# Patient Record
Sex: Female | Born: 1950 | Race: White | Hispanic: No | Marital: Married | Smoking: Never smoker
Health system: Southern US, Community
[De-identification: ages and names within clinical notes are randomized; demographics above are authoritative.]

## PROBLEM LIST (undated history)

## (undated) DIAGNOSIS — N6452 Nipple discharge: Secondary | ICD-10-CM

## (undated) DIAGNOSIS — E785 Hyperlipidemia, unspecified: Secondary | ICD-10-CM

## (undated) DIAGNOSIS — I10 Essential (primary) hypertension: Secondary | ICD-10-CM

## (undated) DIAGNOSIS — Z87442 Personal history of urinary calculi: Secondary | ICD-10-CM

## (undated) DIAGNOSIS — L409 Psoriasis, unspecified: Secondary | ICD-10-CM

## (undated) HISTORY — DX: Nipple discharge: N64.52

## (undated) HISTORY — DX: Hyperlipidemia, unspecified: E78.5

## (undated) HISTORY — DX: Essential (primary) hypertension: I10

## (undated) HISTORY — PX: BREAST EXCISIONAL BIOPSY: SUR124

## (undated) HISTORY — PX: OTHER SURGICAL HISTORY: SHX169

## (undated) HISTORY — PX: TUBAL LIGATION: SHX77

---
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
## 1996-03-16 HISTORY — PX: ABDOMINAL HYSTERECTOMY: SHX81

## 1999-12-05 ENCOUNTER — Encounter: Payer: Self-pay | Admitting: Family Medicine

## 1999-12-05 ENCOUNTER — Encounter: Admission: RE | Admit: 1999-12-05 | Discharge: 1999-12-05 | Payer: Self-pay | Admitting: Family Medicine

## 2000-02-16 ENCOUNTER — Other Ambulatory Visit: Admission: RE | Admit: 2000-02-16 | Discharge: 2000-02-16 | Payer: Self-pay | Admitting: Obstetrics and Gynecology

## 2000-12-06 ENCOUNTER — Encounter: Admission: RE | Admit: 2000-12-06 | Discharge: 2000-12-06 | Payer: Self-pay | Admitting: Family Medicine

## 2000-12-06 ENCOUNTER — Encounter: Payer: Self-pay | Admitting: Family Medicine

## 2001-02-21 ENCOUNTER — Other Ambulatory Visit: Admission: RE | Admit: 2001-02-21 | Discharge: 2001-02-21 | Payer: Self-pay | Admitting: Obstetrics and Gynecology

## 2001-07-07 ENCOUNTER — Emergency Department (HOSPITAL_COMMUNITY): Admission: EM | Admit: 2001-07-07 | Discharge: 2001-07-07 | Payer: Self-pay | Admitting: Emergency Medicine

## 2001-07-07 ENCOUNTER — Encounter: Payer: Self-pay | Admitting: Emergency Medicine

## 2001-12-07 ENCOUNTER — Encounter: Payer: Self-pay | Admitting: Family Medicine

## 2001-12-07 ENCOUNTER — Encounter: Admission: RE | Admit: 2001-12-07 | Discharge: 2001-12-07 | Payer: Self-pay | Admitting: Family Medicine

## 2002-08-11 ENCOUNTER — Encounter: Payer: Self-pay | Admitting: Family Medicine

## 2002-08-11 ENCOUNTER — Ambulatory Visit (HOSPITAL_COMMUNITY): Admission: RE | Admit: 2002-08-11 | Discharge: 2002-08-11 | Payer: Self-pay | Admitting: Family Medicine

## 2002-08-11 IMAGING — CT CT PELVIS W/O CM
1 of 2 series · 15 of 32 positions shown, 19 images · non-contrast
Comparison: [DATE].

FINDINGS
CLINICAL DATA: SPLENIC ARTERY ANEURYSM.
CT OF THE ABDOMEN AND PELVIS WITHOUT CONTRAST - [DATE]
TECHNIQUE: CONTIGUOUS 5 MM AXIAL IMAGES WERE OBTAINED FROM THE UPPER ABDOMEN TO THE PUBIC SYMPHYSIS AFTER
UNINFUSED SPIRAL CT SCANNING OF THE ABDOMEN AND PELVIS.

[Series 2: abd/pelvis 5.0 b30f · axial · 0.74mm/px · z∈[-733,-273]mm · 15 of 100 slices shown, 19 images]
[im 4/100  soft-tissue]
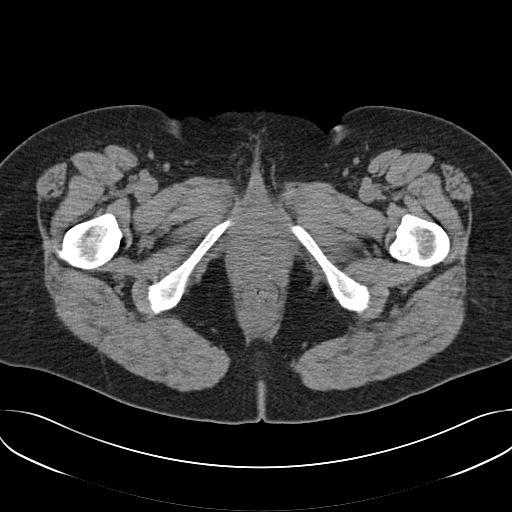
[im 4/100  bone]
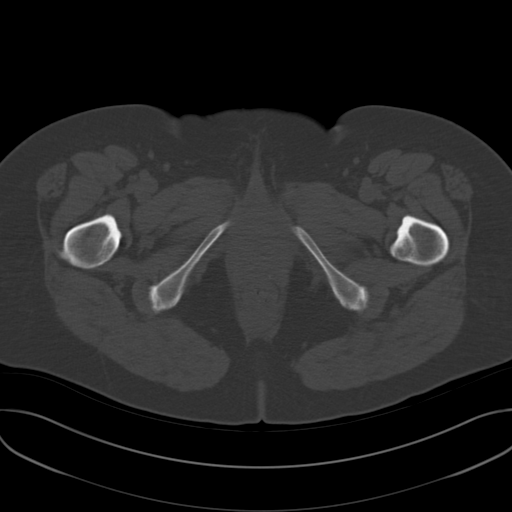
[im 12/100  soft-tissue]
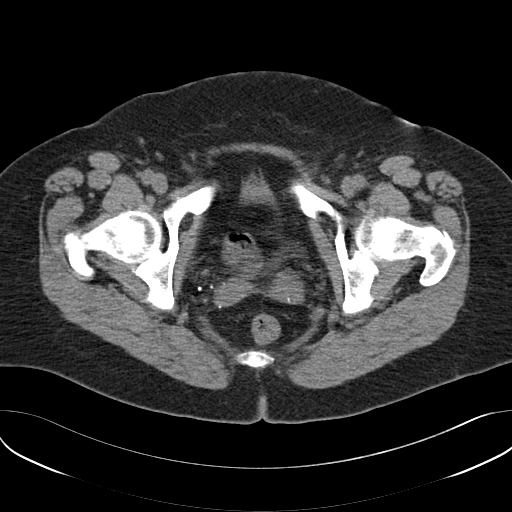
[im 20/100  soft-tissue]
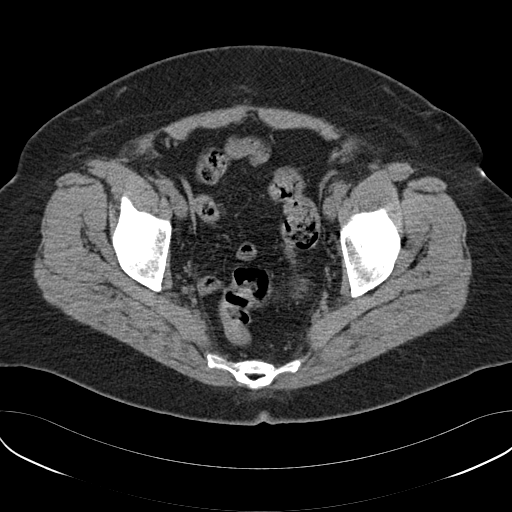
[im 28/100  soft-tissue]
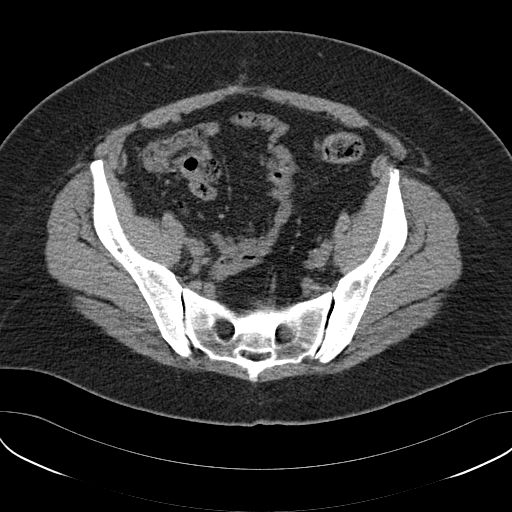
[im 36/100  soft-tissue]
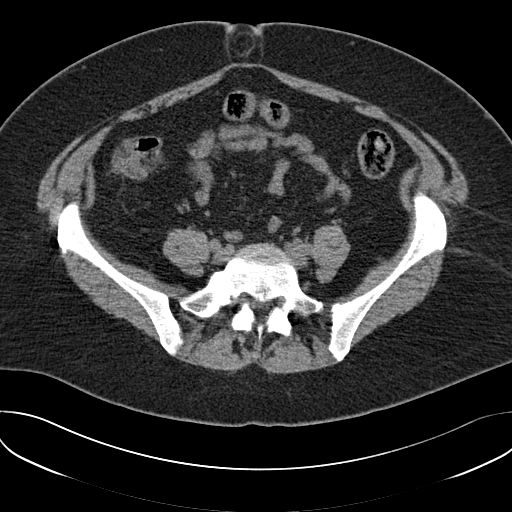
[im 44/100  soft-tissue]
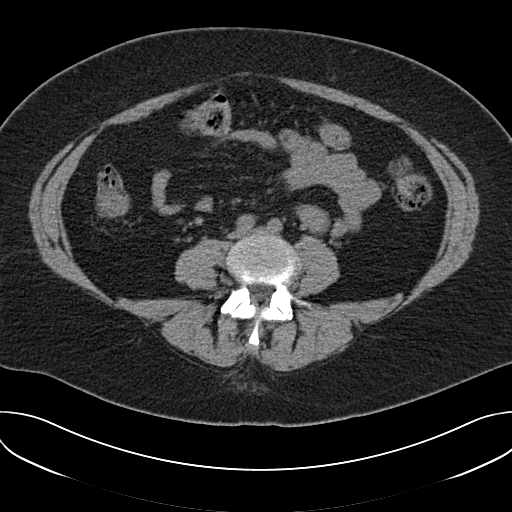
[im 52/100  soft-tissue]
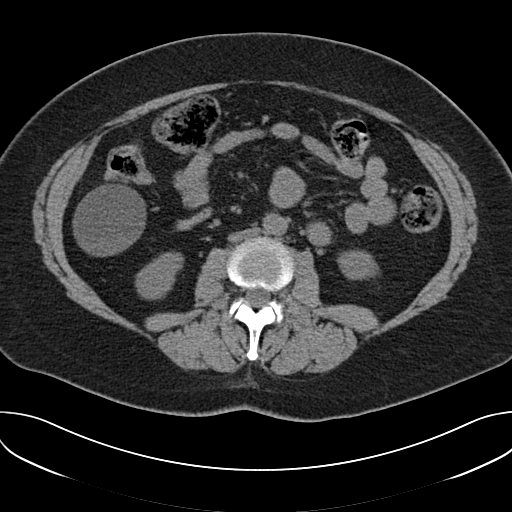
[im 56/100  soft-tissue]
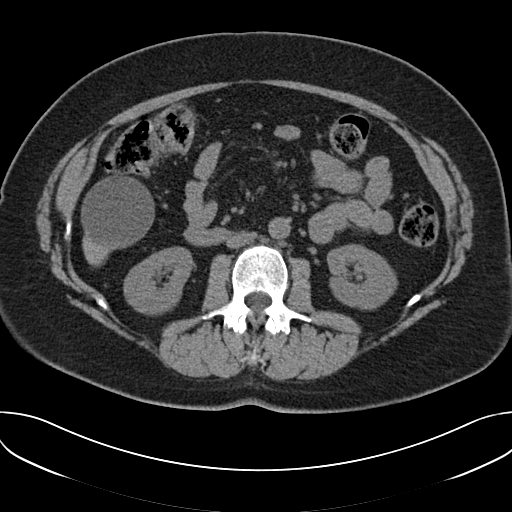
[im 64/100  soft-tissue]
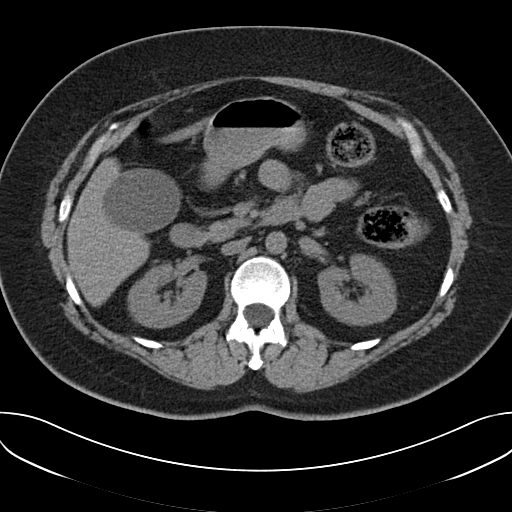
[im 64/100  bone]
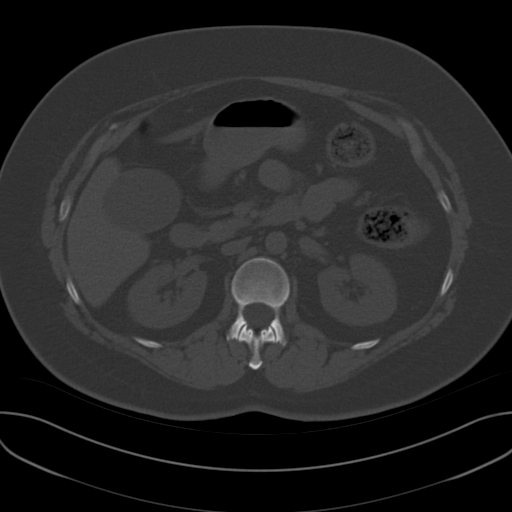
[im 72/100  soft-tissue]
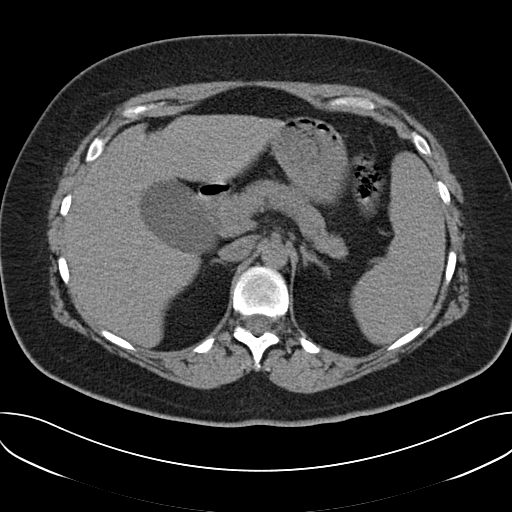
[im 80/100  soft-tissue]
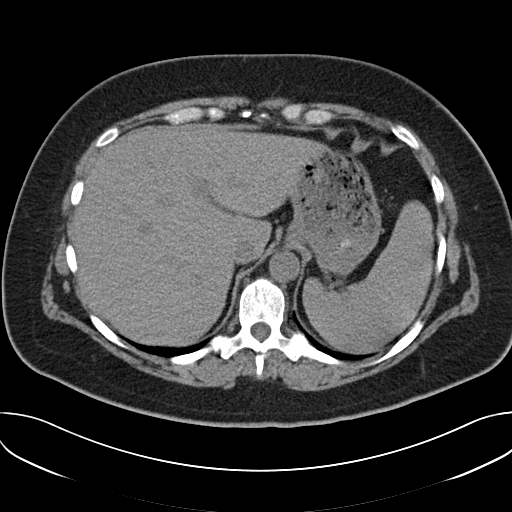
[im 84/100  lung]
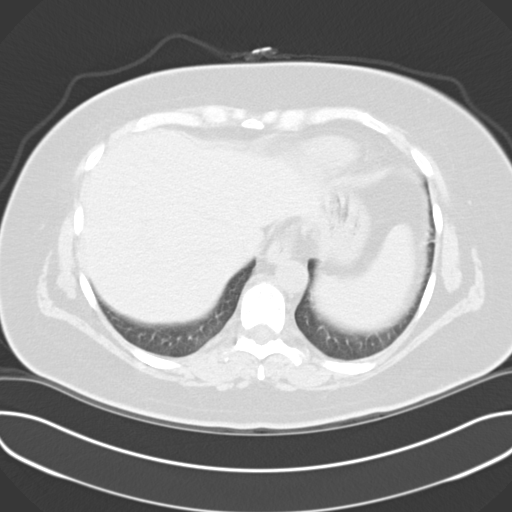
[im 88/100  soft-tissue]
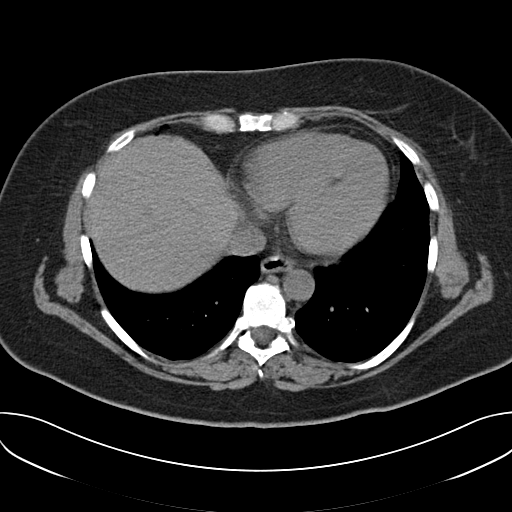
[im 88/100  lung]
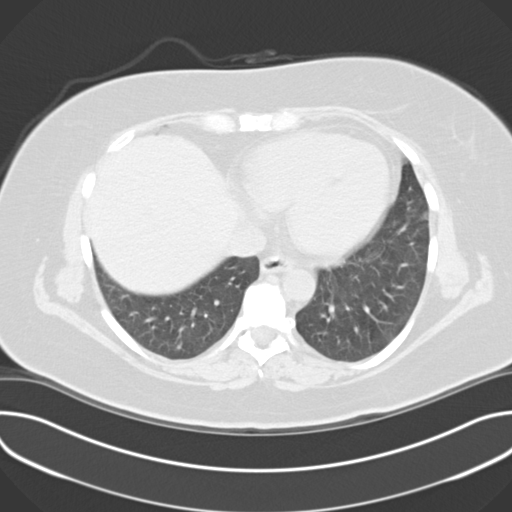
[im 92/100  lung]
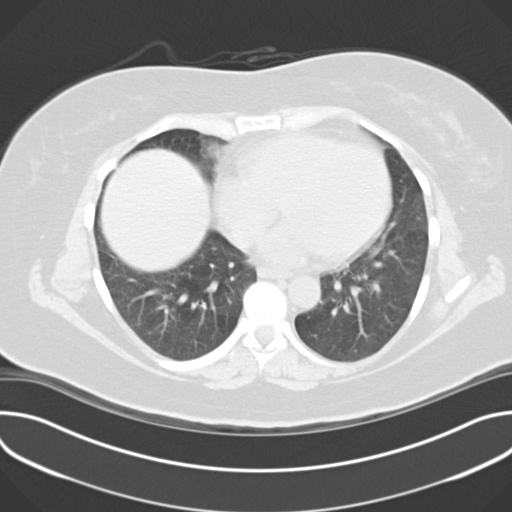
[im 96/100  soft-tissue]
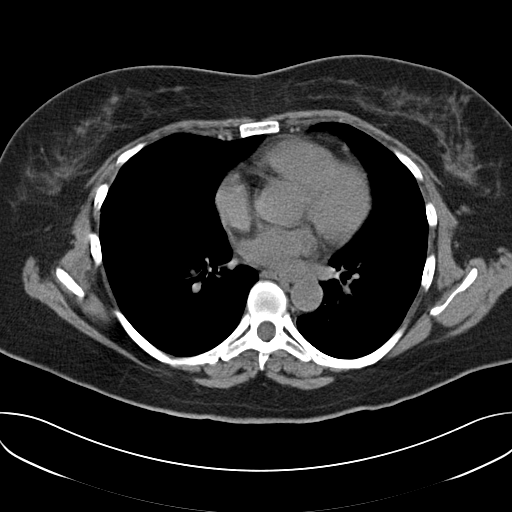
[im 96/100  lung]
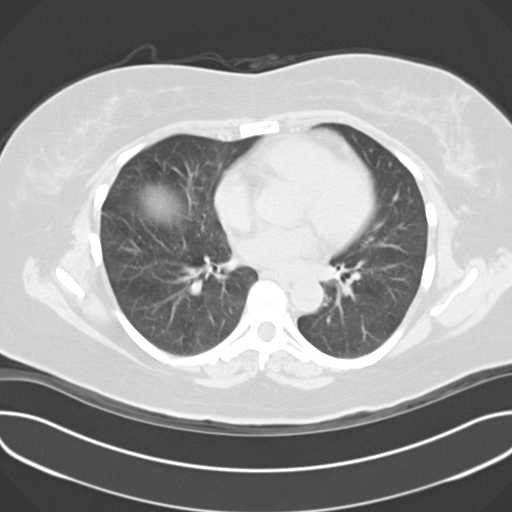

[15 of 32 positions shown; findings below may reference images not displayed]

FINDINGS: ABDOMEN:
NO FOCAL ABNORMALITY IS SEEN IN THE LIVER OR SPLEEN ALTHOUGH THE LACK OF INTRAVENOUS CONTRAST
LESSENS SENSITIVITY FOR EVALUATION.  THE STOMACH, DUODENUM, PANCREAS, AND ADRENAL GLANDS ARE
UNREMARKABLE.  THE GALLBLADDER IS DISTENDED.  THERE IS NO EVIDENCE FOR RENAL CALCULI.
THE CALCIFIED SPLENIC ARTERY ANEURYSM IS AGAIN IDENTIFIED AND MEASURES 1.0 CM IN MAXIMUM OBTAINABLE
DIAMETER.
NO FREE FLUID OR LYMPHADENOPATHY IS APPARENT.
A SMALL UMBILICAL HERNIA CONTAINS ONLY FAT.
IMPRESSION
1.   NO INTERVAL CHANGE IN THE CALCIFIED SPLENIC ARTERY ANEURYSM.
2.   SMALL UMBILICAL HERNIA.
PELVIS:
IMAGING THROUGH THE ANATOMIC PELVIS SHOWS THE PATIENT TO BE STATUS POST HYSTERECTOMY.  NO EVIDENCE
FOR ADNEXAL MASSES.
IMPRESSION
UNREMARKABLE STATUS POST HYSTERECTOMY.

## 2003-03-17 IMAGING — CR DG CHEST 2V
2 series · 2 of 2 positions shown · non-contrast
Comparison: None.

CLINICAL DATA: Cough.
 CHEST, TWO VIEWS:

[view not recorded (1 of 2)]
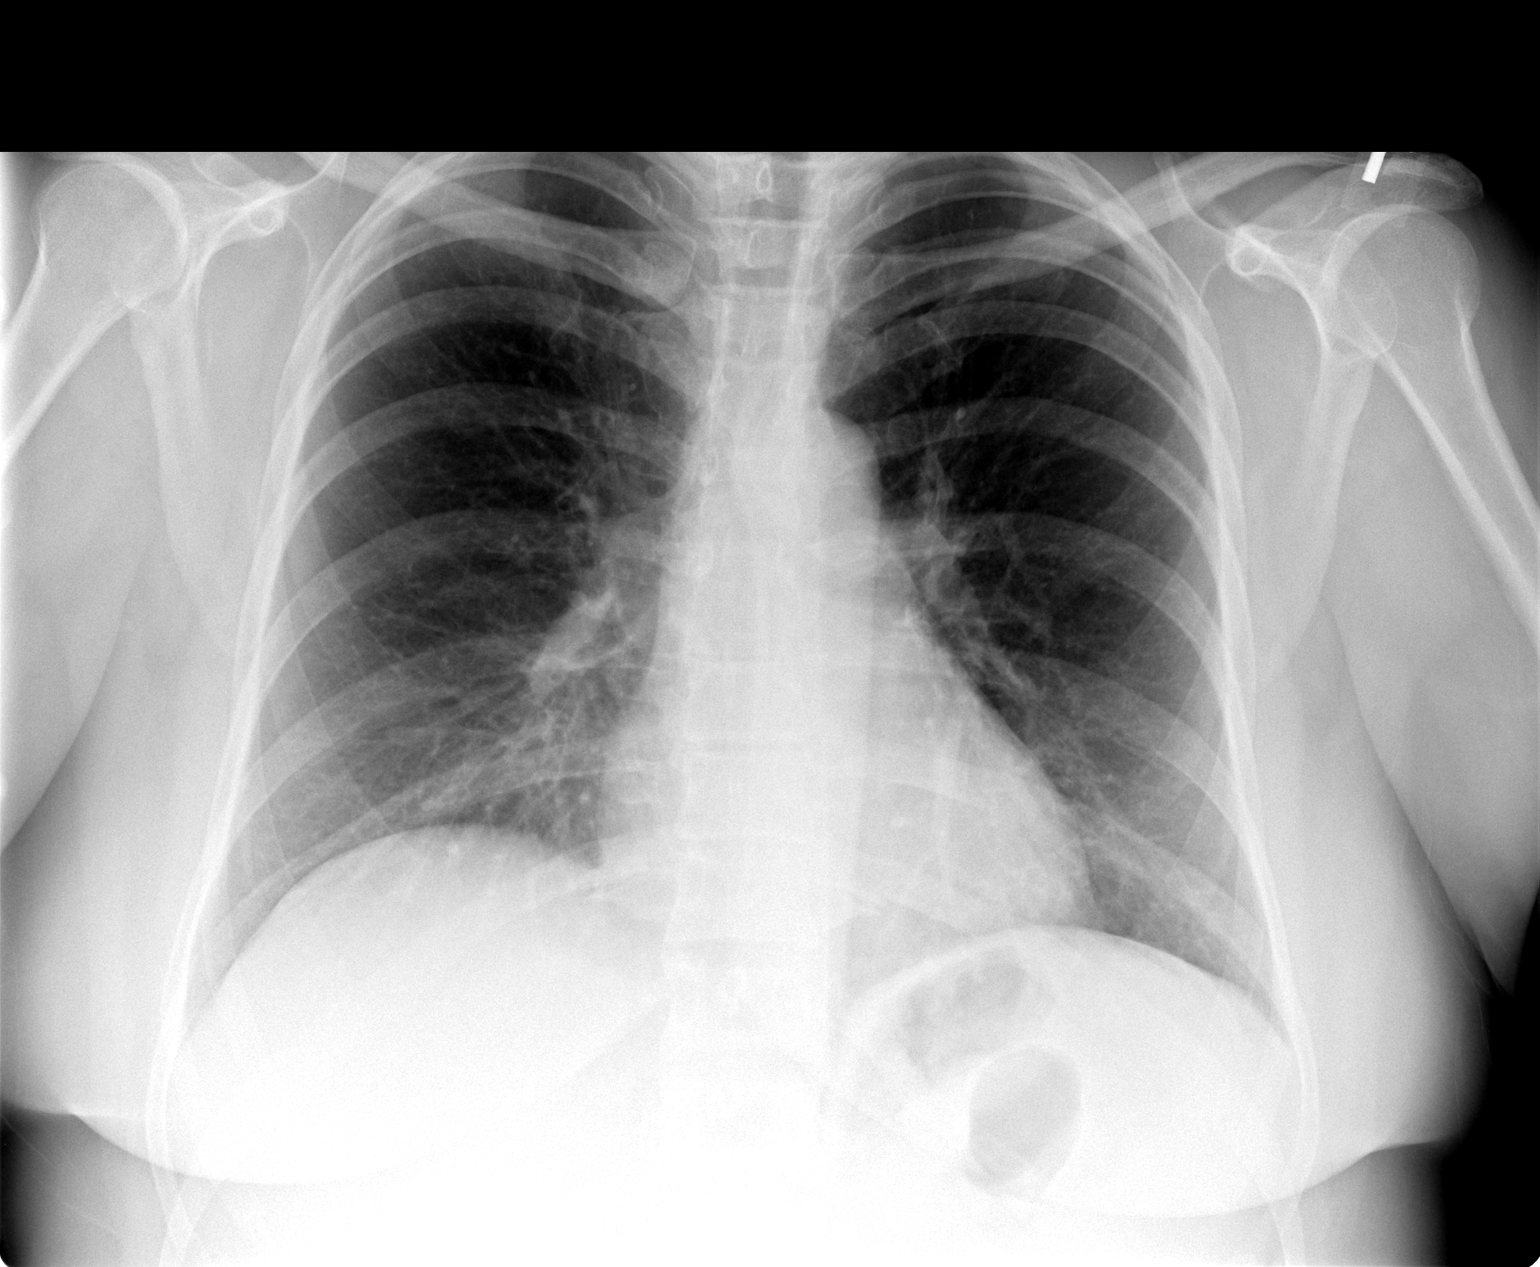

[view not recorded (2 of 2)]
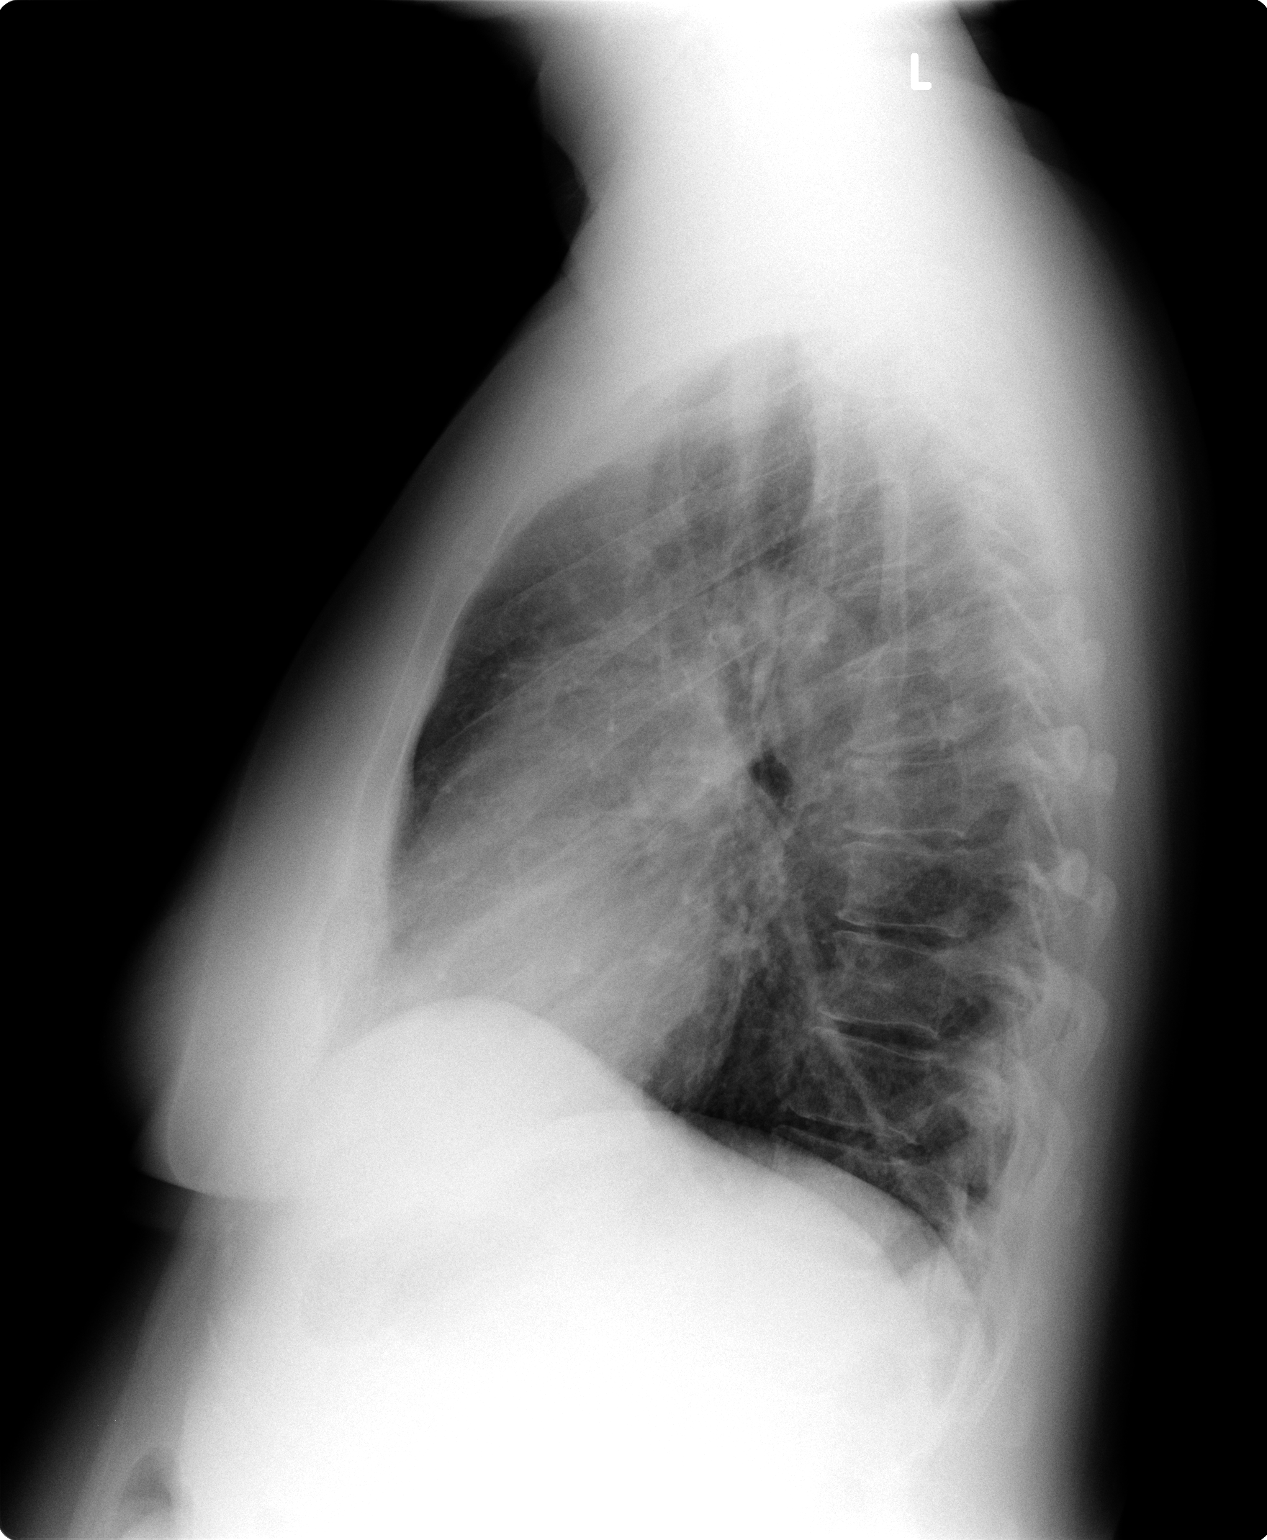

[2 of 2 positions shown; findings below may reference images not displayed]

FINDINGS: Heart size within normal limits.  Mild peribronchial thickening.  No acute air space disease or pleural fluid.
IMPRESSION: Slight peribronchial thickening ? no active disease.

## 2003-08-10 ENCOUNTER — Encounter: Admission: RE | Admit: 2003-08-10 | Discharge: 2003-08-10 | Payer: Self-pay | Admitting: Obstetrics and Gynecology

## 2004-02-14 ENCOUNTER — Ambulatory Visit (HOSPITAL_COMMUNITY): Admission: RE | Admit: 2004-02-14 | Discharge: 2004-02-14 | Payer: Self-pay | Admitting: Family Medicine

## 2004-02-14 IMAGING — CT CT ABDOMEN W/O CM
1 of 2 series · 15 of 32 positions shown, 19 images · IV contrast (agent unspecified)
Comparison: [DATE].
 ABDOMEN:

CLINICAL DATA: Splenic artery aneurysm ? follow-up.
 CT ABDOMEN AND PELVIS WITHOUT CONTRAST:
TECHNIQUE: Multidetector helical imaging was carried out through the abdomen and pelvis without oral or IV contrast, as requested.

[Series 3: — · axial · 0.71mm/px · z∈[-465,-20]mm · 15 of 99 slices shown, 19 images]
[im 5/99  soft-tissue]
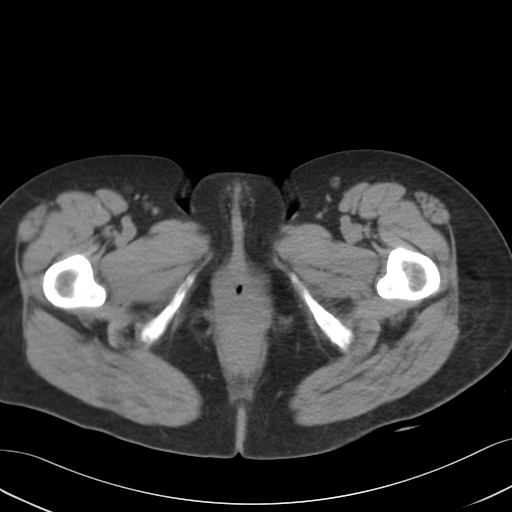
[im 5/99  bone]
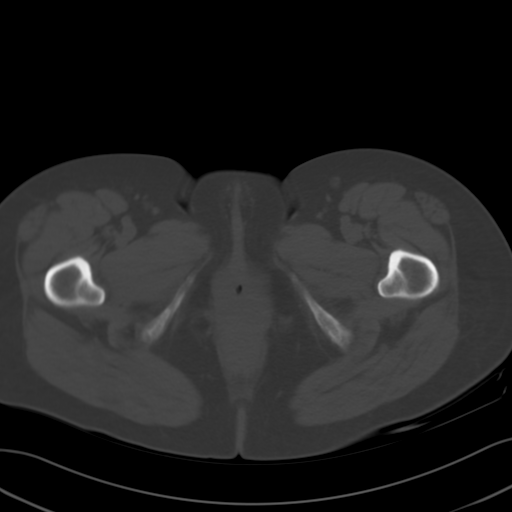
[im 13/99  soft-tissue]
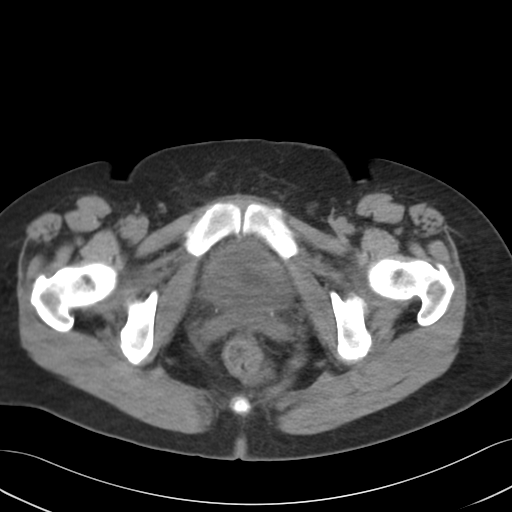
[im 21/99  soft-tissue]
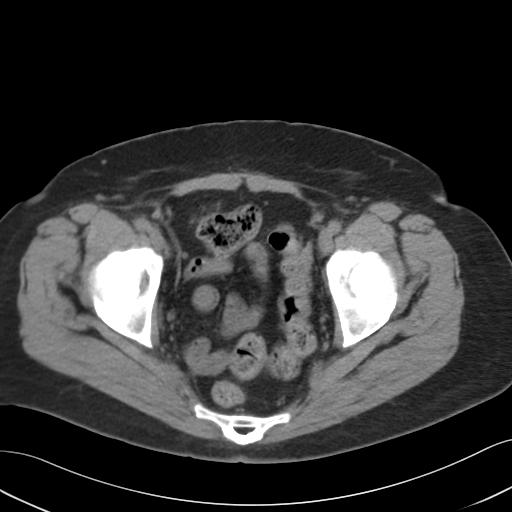
[im 29/99  soft-tissue]
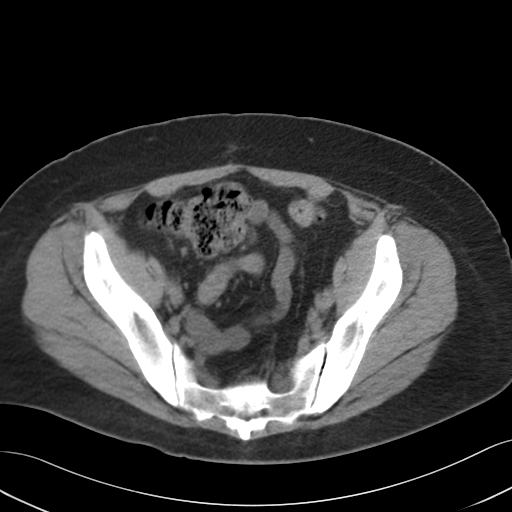
[im 33/99  soft-tissue]
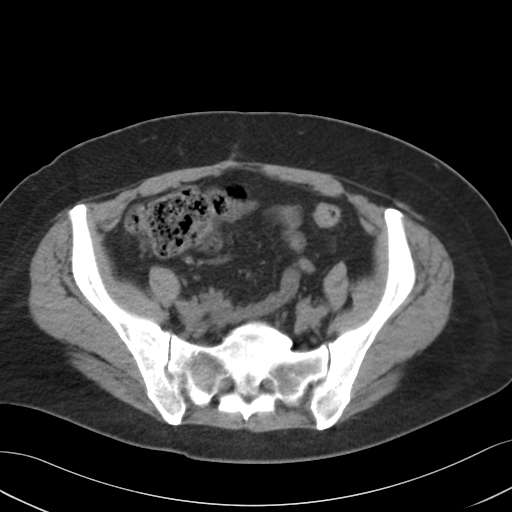
[im 41/99  soft-tissue]
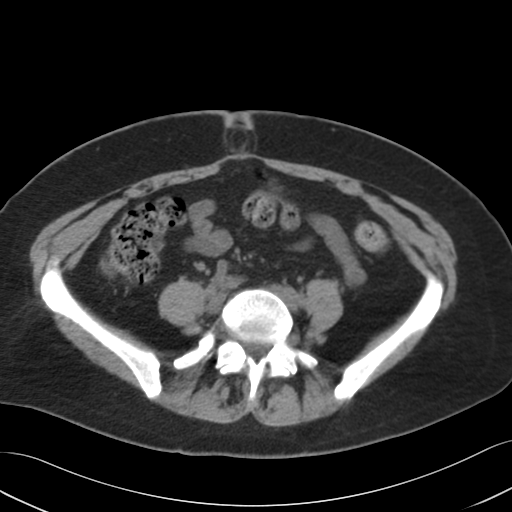
[im 50/99  soft-tissue]
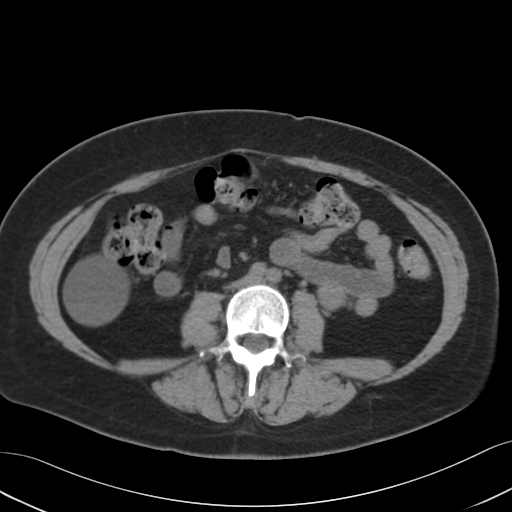
[im 58/99  soft-tissue]
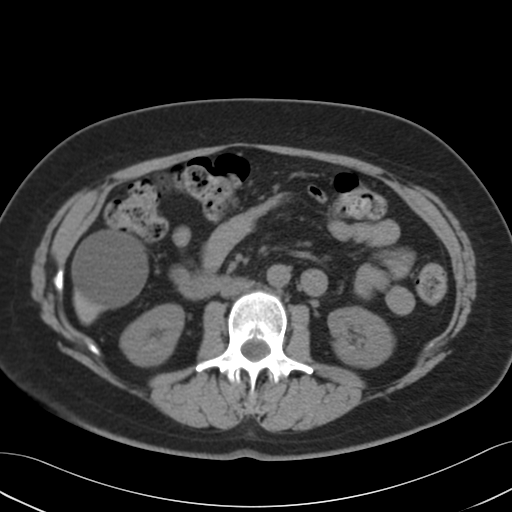
[im 66/99  soft-tissue]
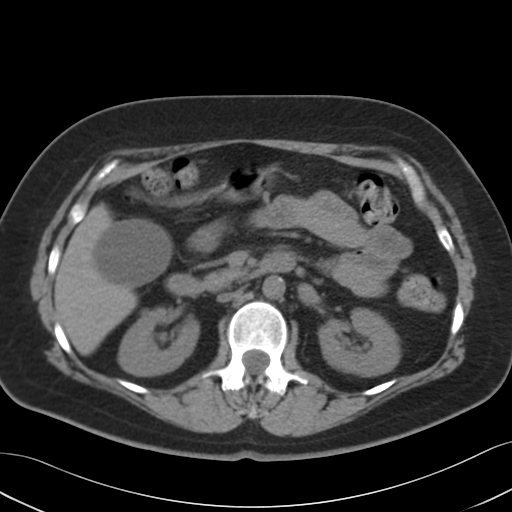
[im 66/99  bone]
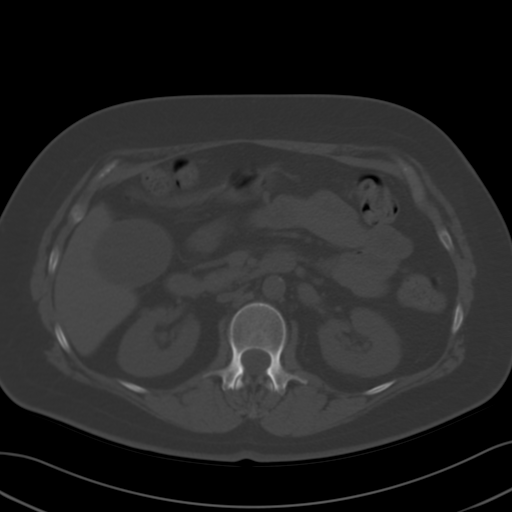
[im 70/99  soft-tissue]
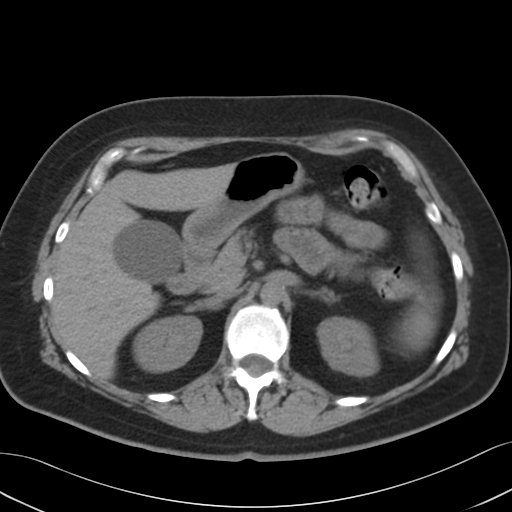
[im 78/99  soft-tissue]
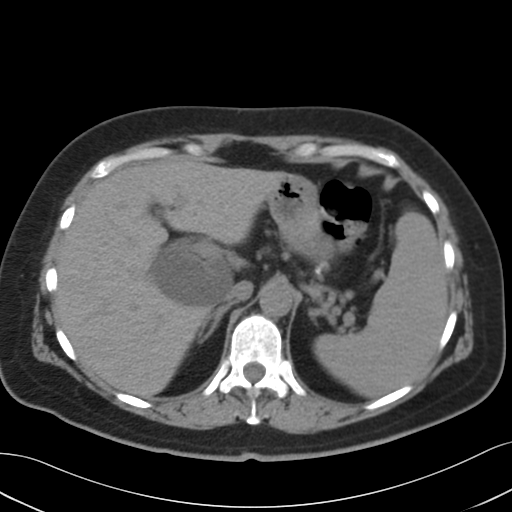
[im 82/99  lung]
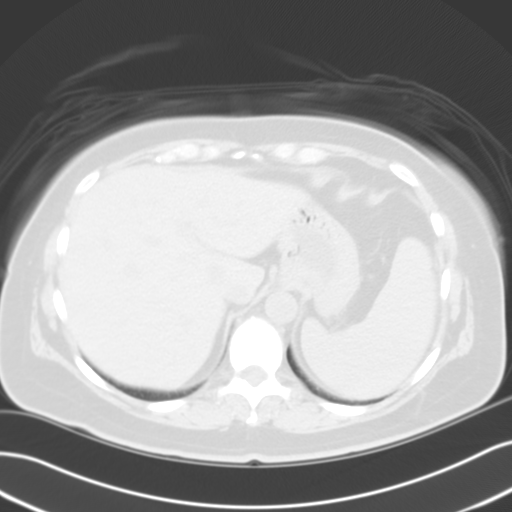
[im 86/99  soft-tissue]
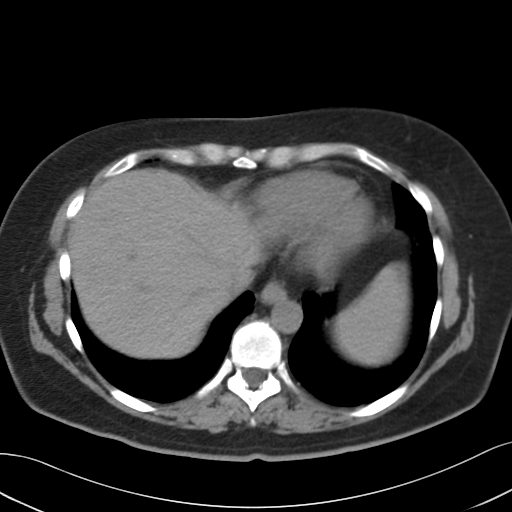
[im 86/99  lung]
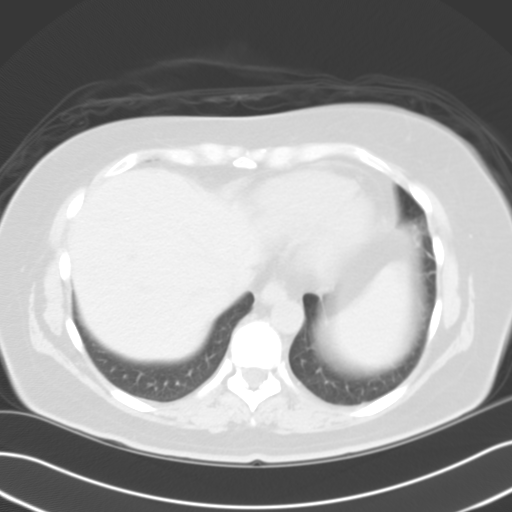
[im 90/99  lung]
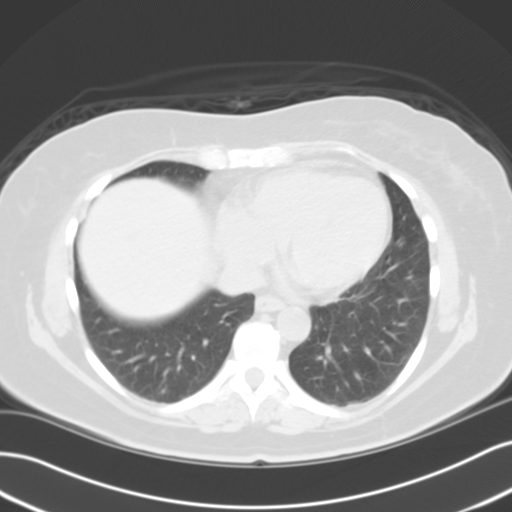
[im 94/99  soft-tissue]
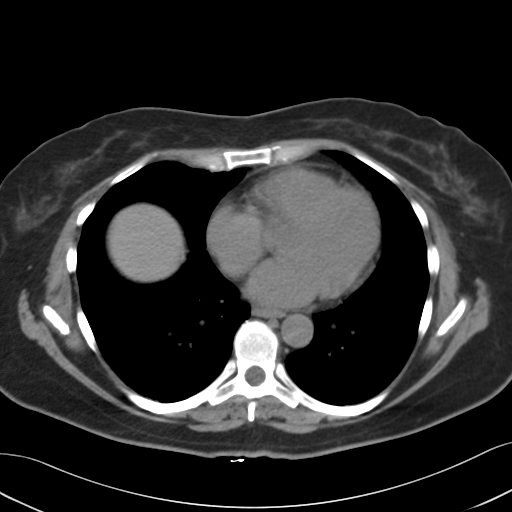
[im 94/99  lung]
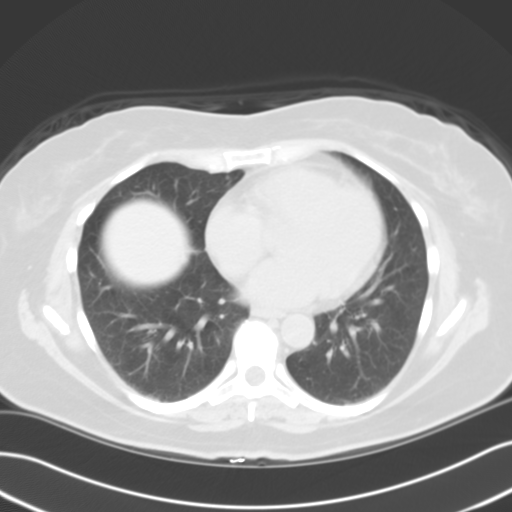

[15 of 32 positions shown; findings below may reference images not displayed]

FINDINGS: The calcified splenic artery aneurysm has not significantly changed in size or contour.  It is a bit more heavily calcified perhaps, but it remains about 10 mm in maximum size.  
 No other findings of significance.  The gallbladder is moderately distended but this was noted previously.
 Other viscera are unremarkable in the unenhanced state.
IMPRESSION: 1.  Stable splenic artery aneurysm ? about 10 mm in maximum diameter.
 2.  Moderately distended gallbladder as before.
 3.  No other acute or significant findings.
 PELVIS:
FINDINGS: No focal masses, adenopathy, or fluid collections.  Umbilical hernia is again noted.
IMPRESSION: 1.  No acute or significant findings.  
 2.  There is an umbilical hernia as before.

## 2004-05-20 ENCOUNTER — Encounter: Admission: RE | Admit: 2004-05-20 | Discharge: 2004-05-20 | Payer: Self-pay | Admitting: Family Medicine

## 2004-12-04 ENCOUNTER — Encounter: Admission: RE | Admit: 2004-12-04 | Discharge: 2004-12-04 | Payer: Self-pay | Admitting: Obstetrics and Gynecology

## 2005-12-29 ENCOUNTER — Encounter: Admission: RE | Admit: 2005-12-29 | Discharge: 2005-12-29 | Payer: Self-pay | Admitting: Obstetrics and Gynecology

## 2005-12-29 IMAGING — MG MM SCREEN MAMMOGRAM BILATERAL
5 series · 5 of 5 positions shown · non-contrast
Comparison: none

DG SCREEN MAMMOGRAM BILATERAL
Bilateral CC and MLO view(s) were taken.

DIGITAL SCREENING MAMMOGRAM WITH CAD:
There is a fibroglandular pattern.  No masses or malignant type calcifications are identified.  
Compared with prior studies.

[R CC]
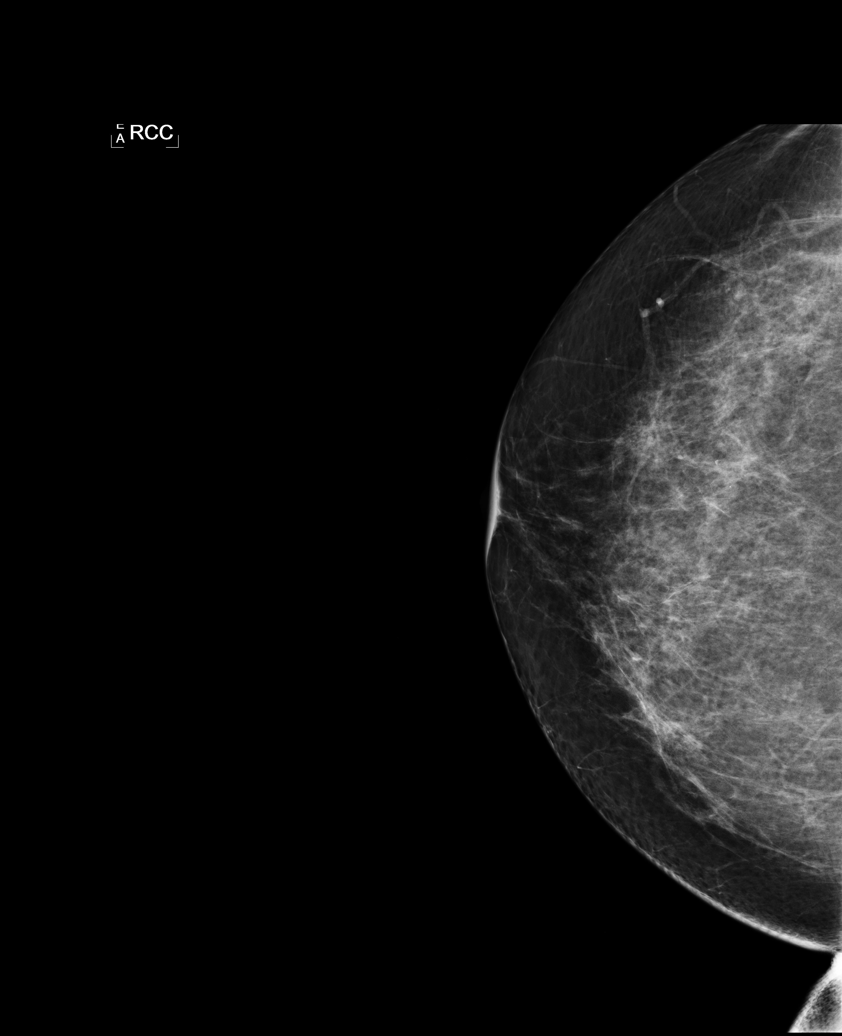

[L CC]
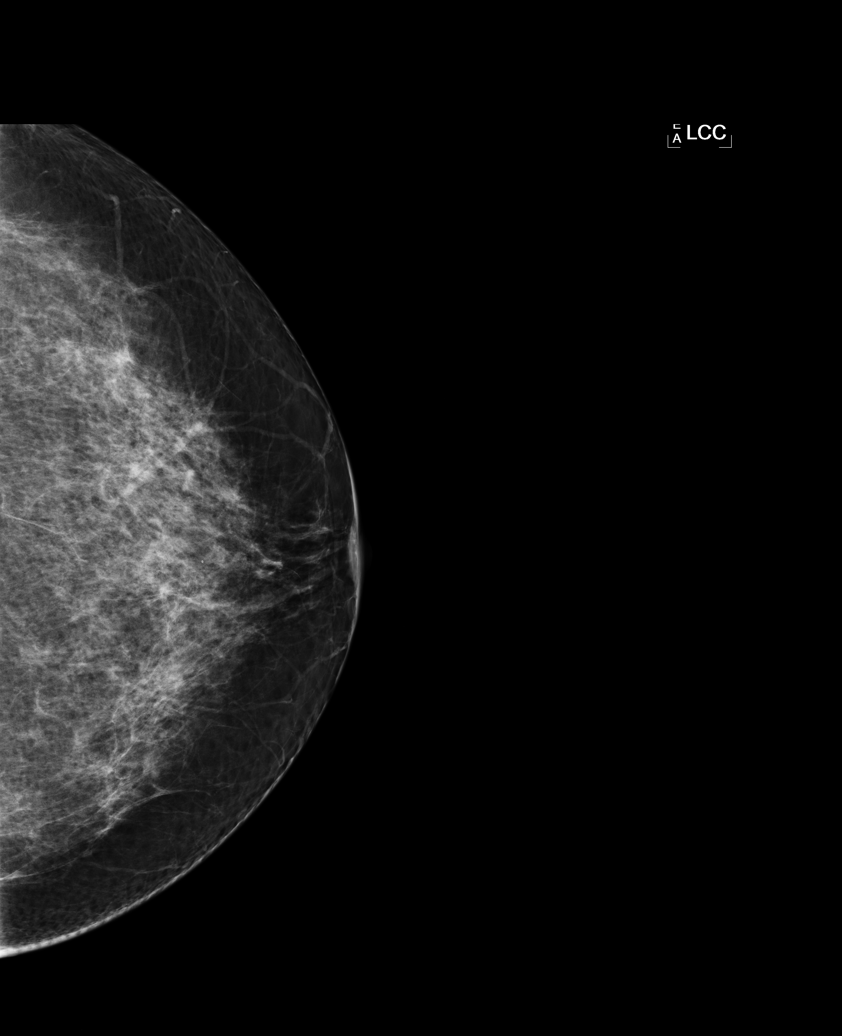

[L MLO]
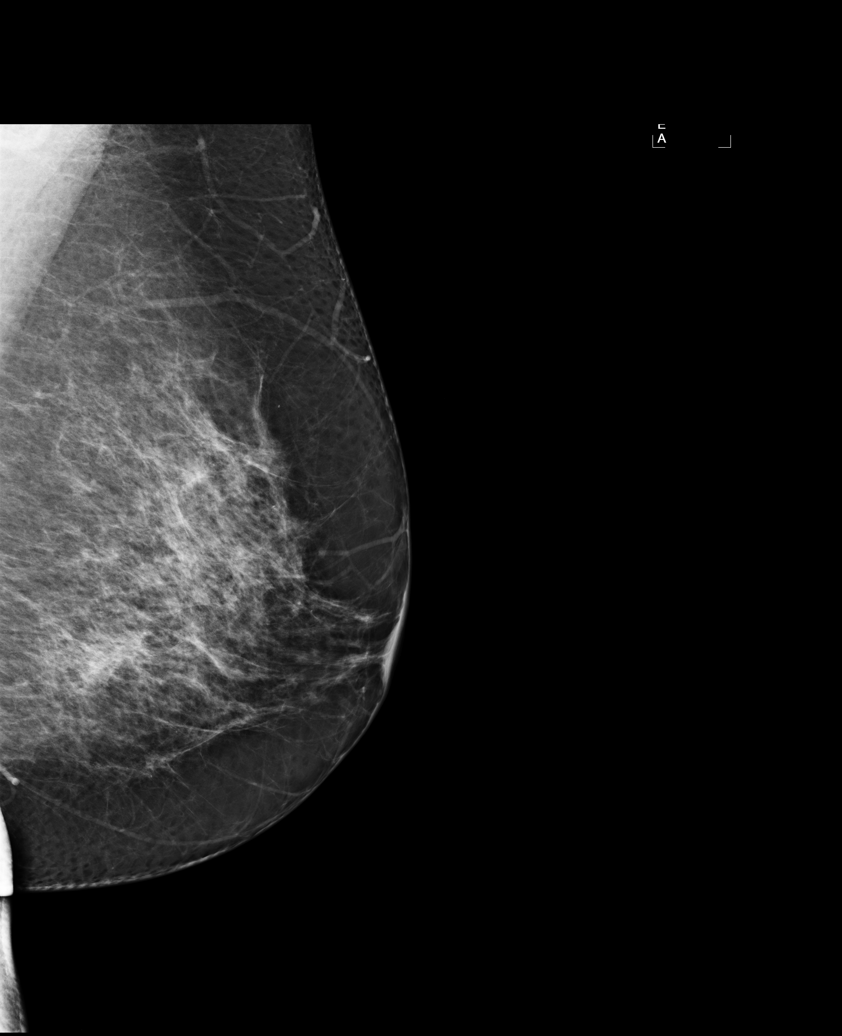

[R MLO (1 of 2)]
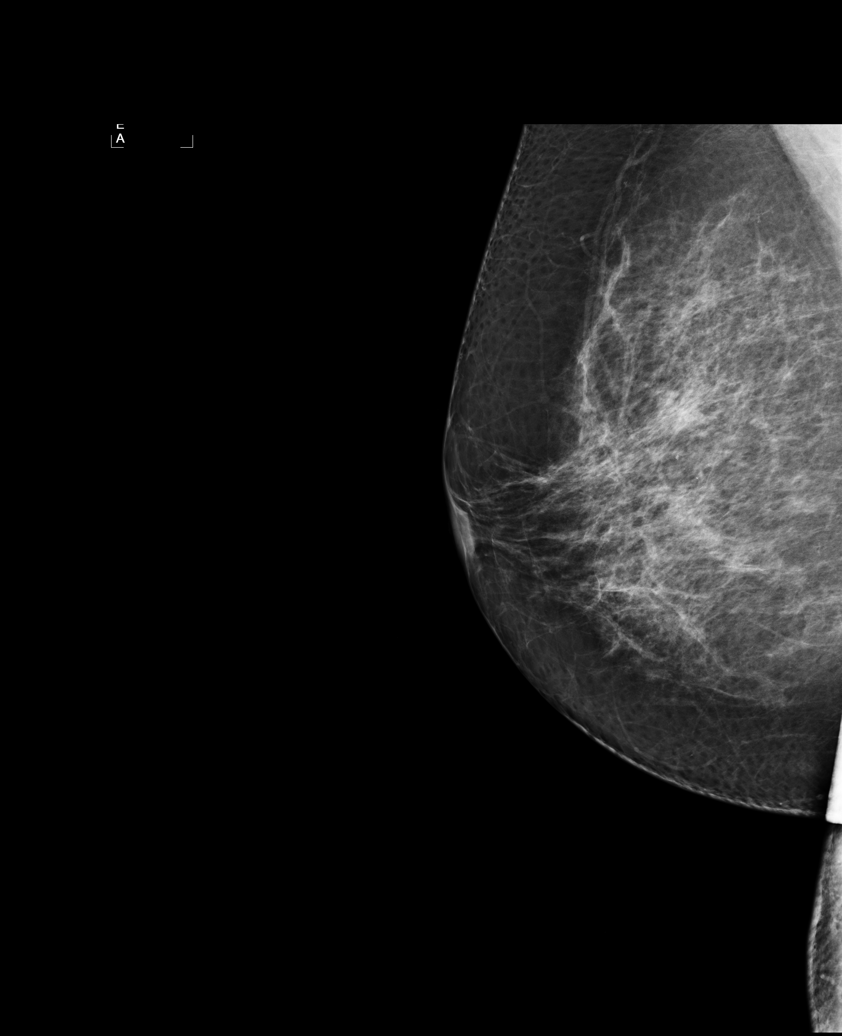

[R MLO (2 of 2)]
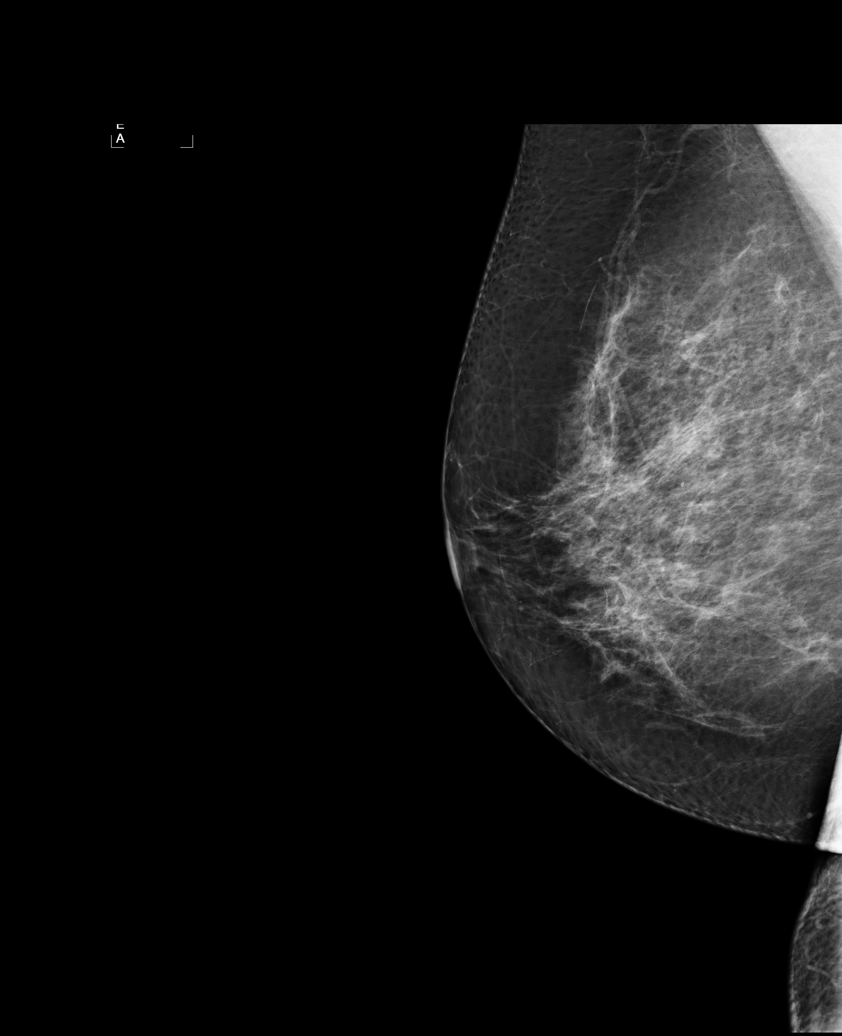

[5 of 5 positions shown; findings below may reference images not displayed]

IMPRESSION: No specific mammographic evidence of malignancy.  Next screening mammogram is recommended in one 
year.

ASSESSMENT: Negative - BI-RADS 1

Screening mammogram in 1 year.
ANALYZED BY COMPUTER AIDED DETECTION. , THIS PROCEDURE WAS A DIGITAL MAMMOGRAM.

## 2008-09-21 ENCOUNTER — Encounter: Admission: RE | Admit: 2008-09-21 | Discharge: 2008-09-21 | Payer: Self-pay | Admitting: Obstetrics and Gynecology

## 2008-09-21 IMAGING — MG MM SCREEN MAMMOGRAM BILATERAL
4 series · 4 of 4 positions shown · non-contrast
Comparison: Prior studies.

DG SCREEN MAMMOGRAM BILATERAL
Bilateral CC and MLO view(s) were taken.
Prior study comparison: [DATE], DG screen mammogram bilateral.  [DATE], 
bilateral screening mammogram.

DIGITAL SCREENING MAMMOGRAM WITH CAD:

[R CC]
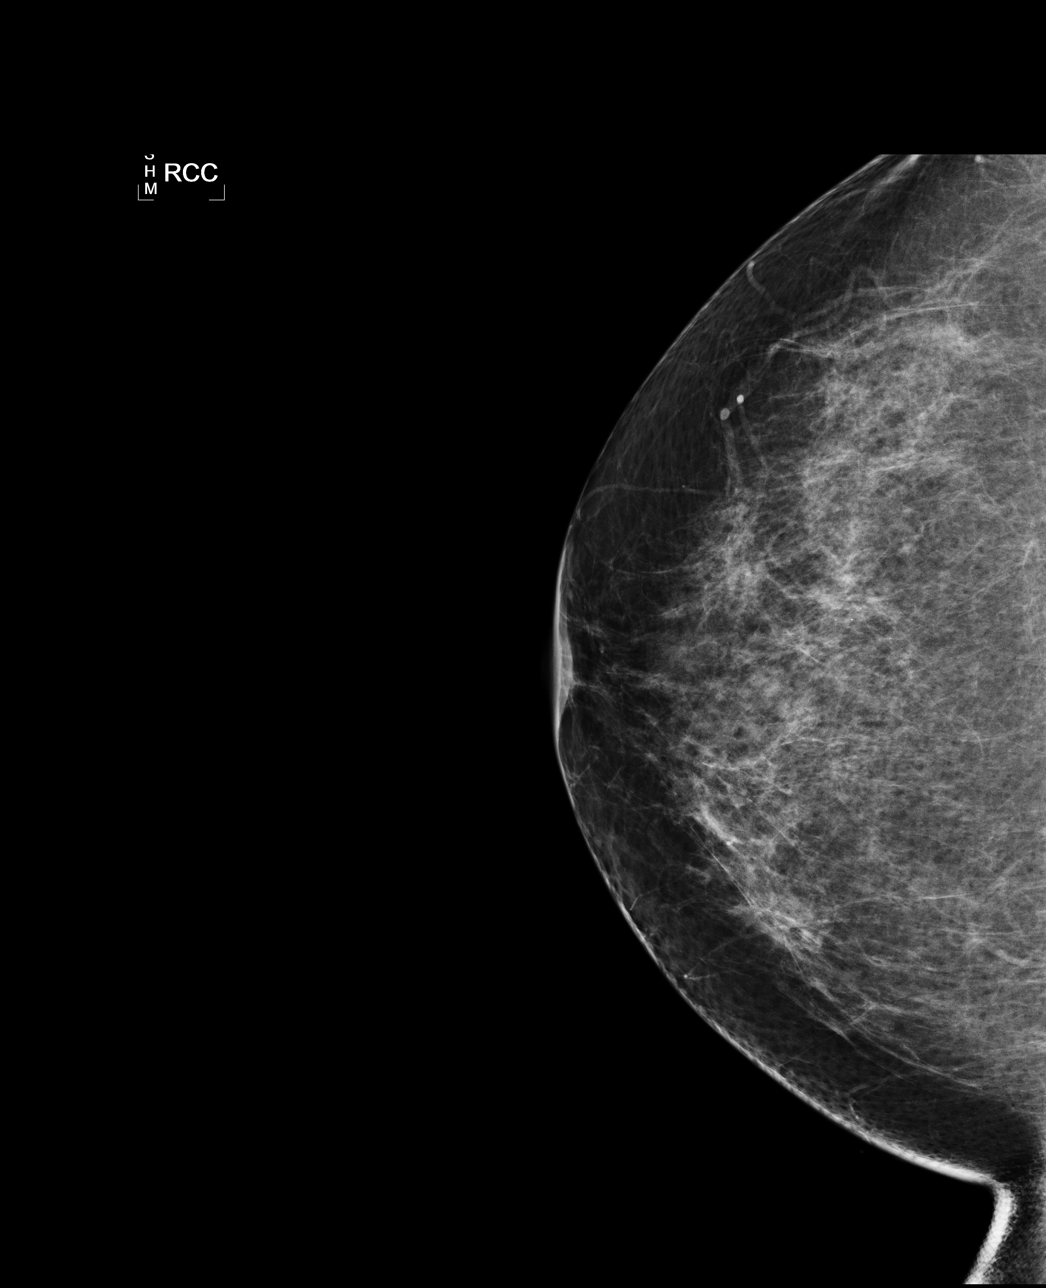

[L CC]
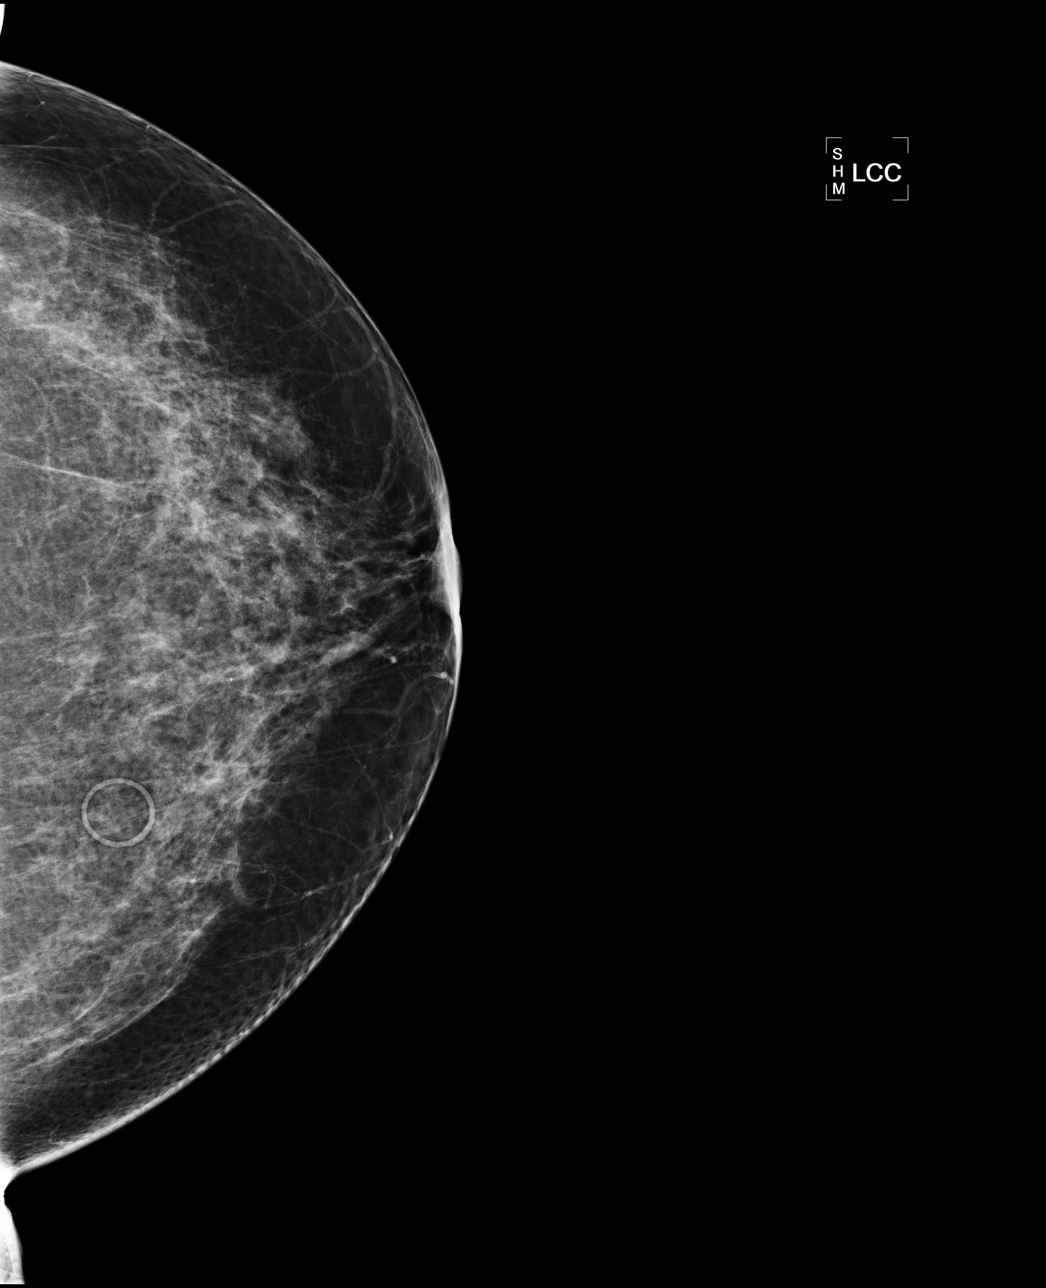

[L MLO]
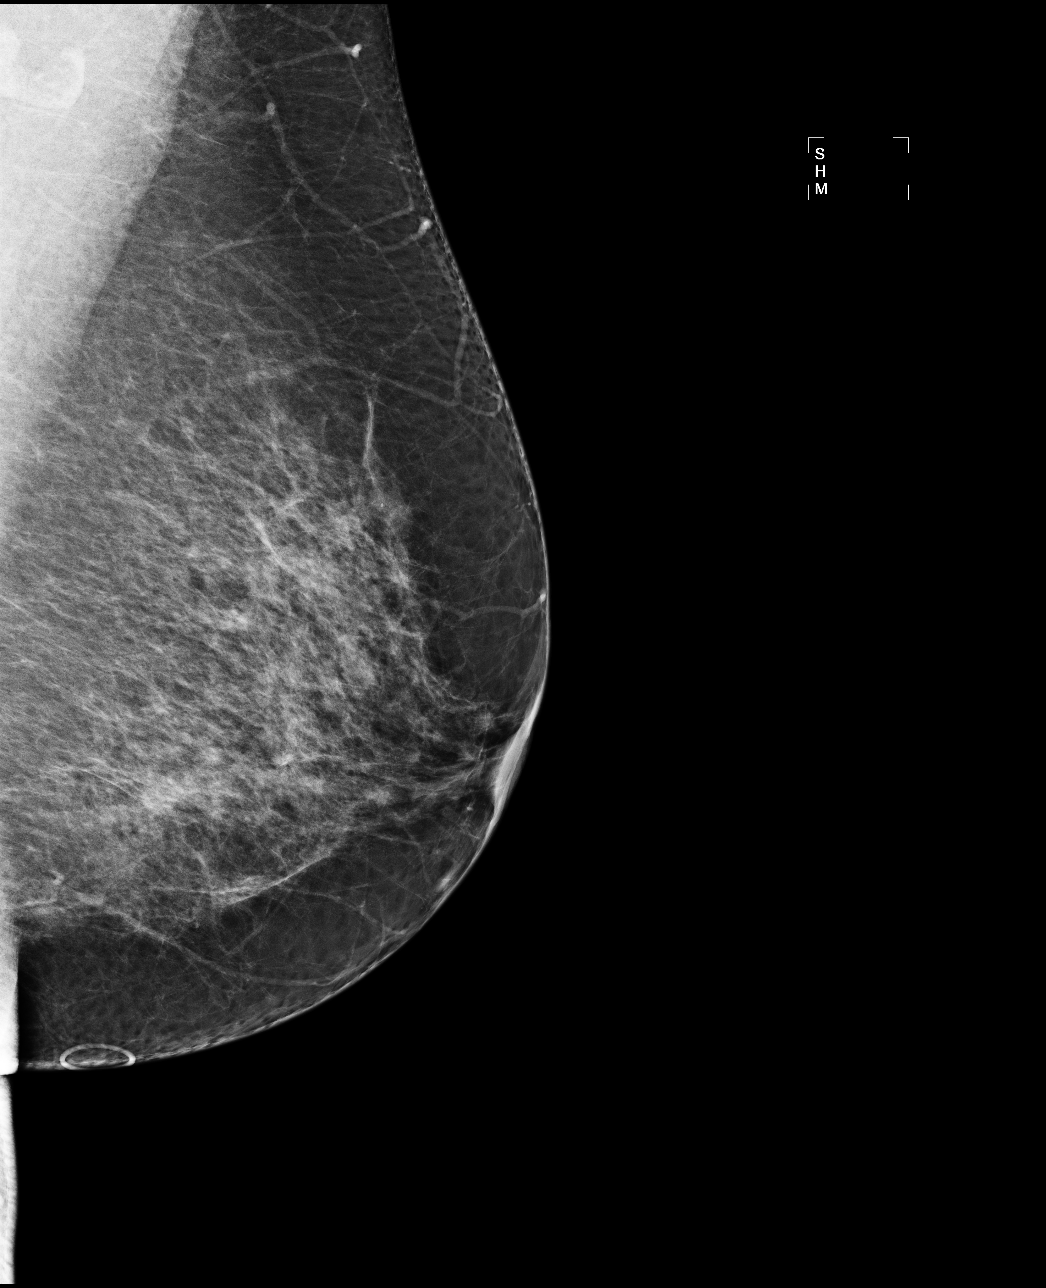

[R MLO]
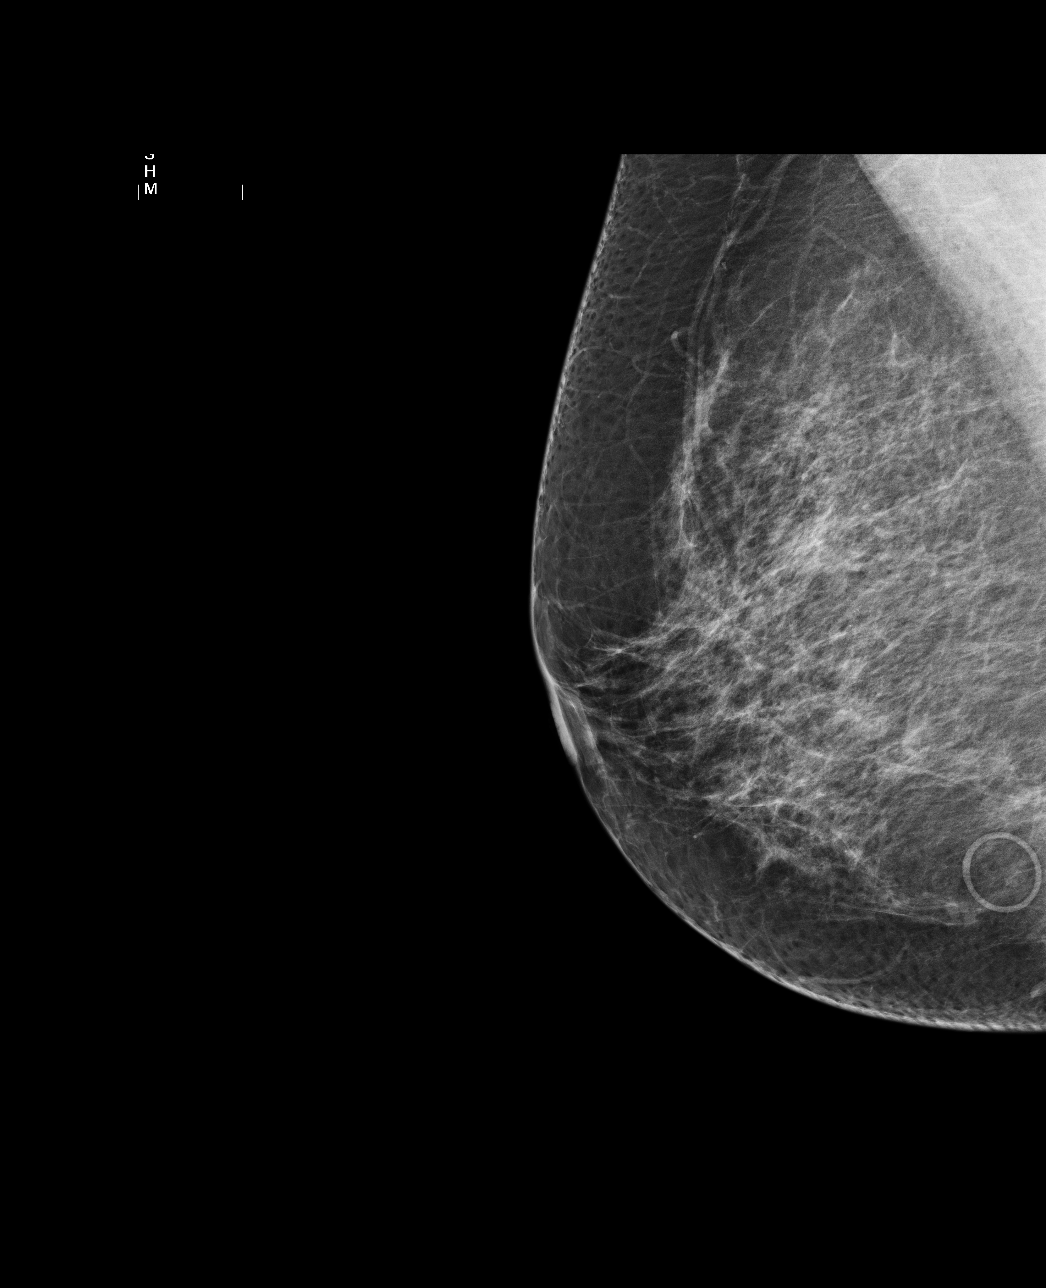

[4 of 4 positions shown; findings below may reference images not displayed]

There are scattered fibroglandular densities.  There is no dominant mass, architectural distortion 
or calcification to suggest malignancy.

Images were processed with CAD.
IMPRESSION: No mammographic evidence of malignancy.  Suggest yearly screening mammography.

A result letter of this screening mammogram will be mailed directly to the patient.

ASSESSMENT: Negative - BI-RADS 1

Screening mammogram in 1 year.
ANALYZED BY COMPUTER AIDED DETECTION. , THIS PROCEDURE WAS A DIGITAL MAMMOGRAM.

## 2009-09-23 ENCOUNTER — Encounter: Admission: RE | Admit: 2009-09-23 | Discharge: 2009-09-23 | Payer: Self-pay | Admitting: Obstetrics and Gynecology

## 2009-09-23 IMAGING — MG MM DIGITAL DIAGNOSTIC BILAT
4 series · 4 of 4 positions shown · non-contrast
Comparison: Multiple priors

CLINICAL DATA: 58-year-old female with history of intermittent,
spontaneous left nipple discharge.

DIGITAL DIAGNOSTIC BILATERAL MAMMOGRAM WITH CAD

[R CC]
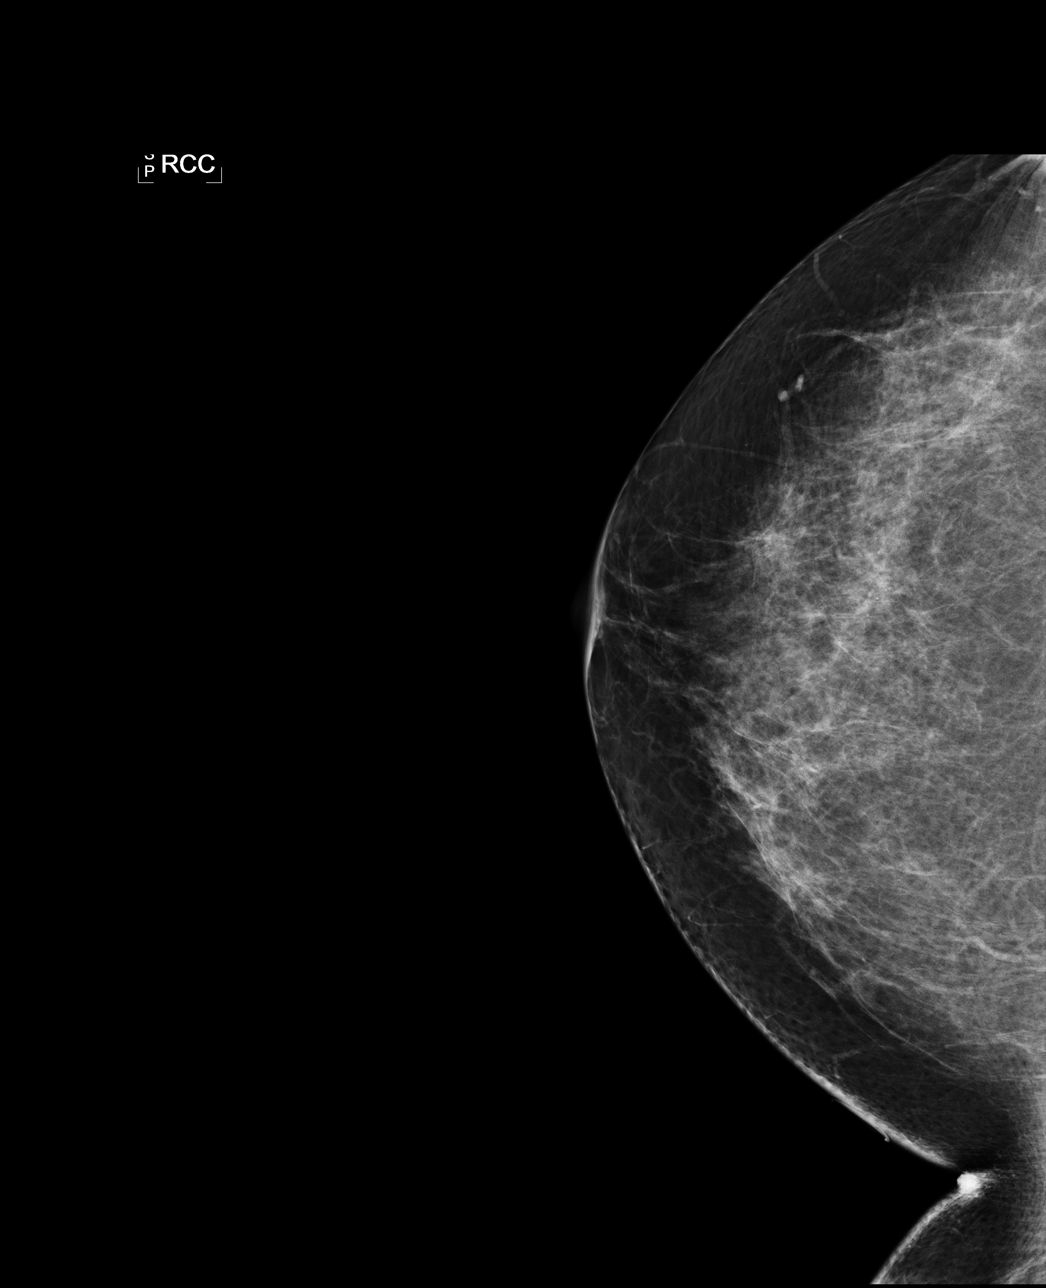

[L CC]
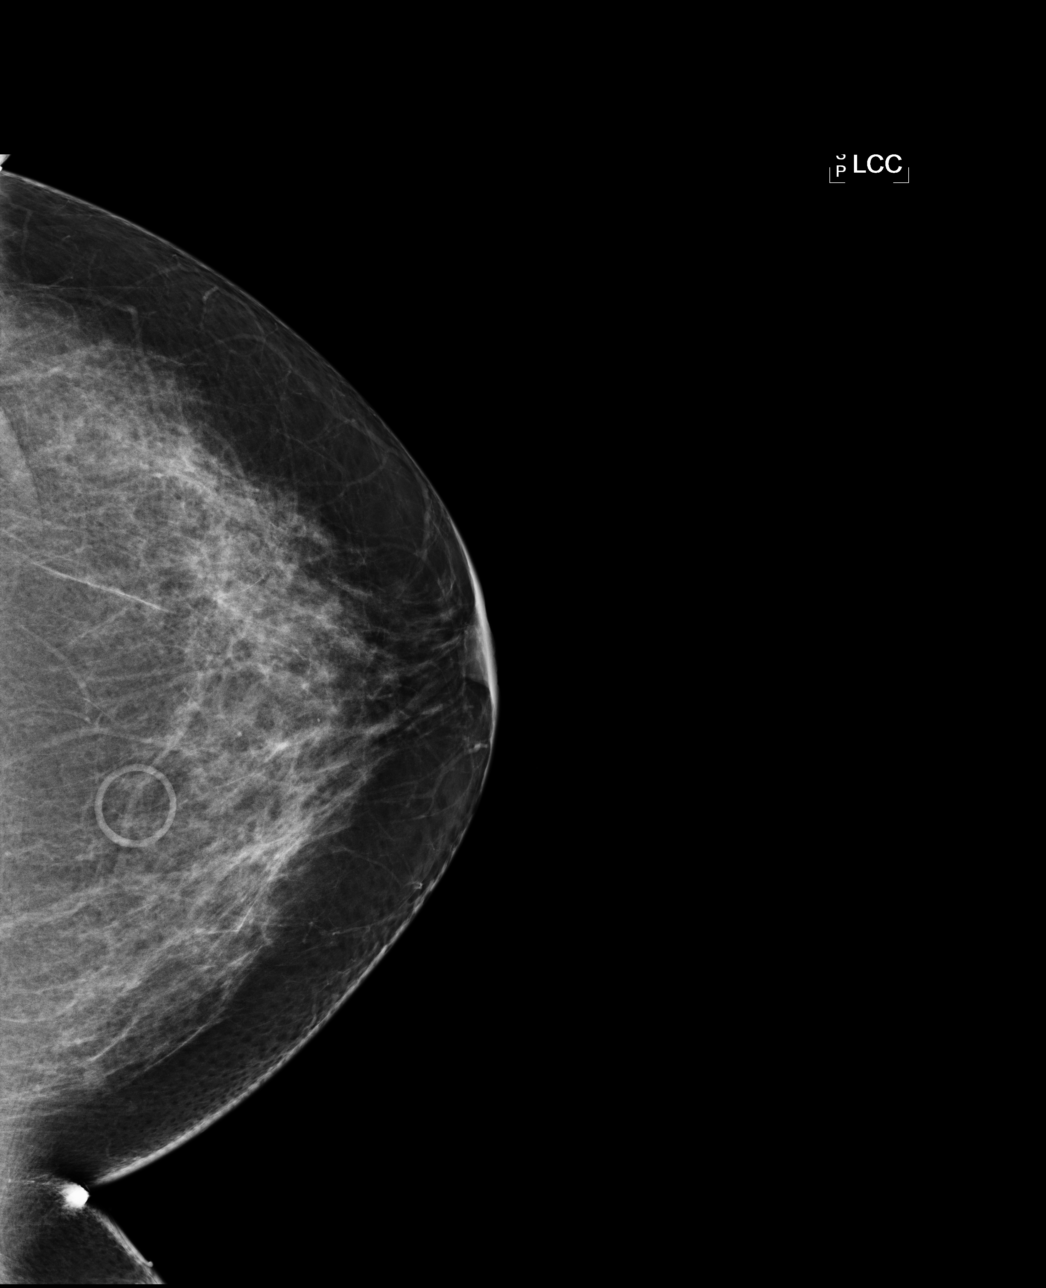

[L MLO]
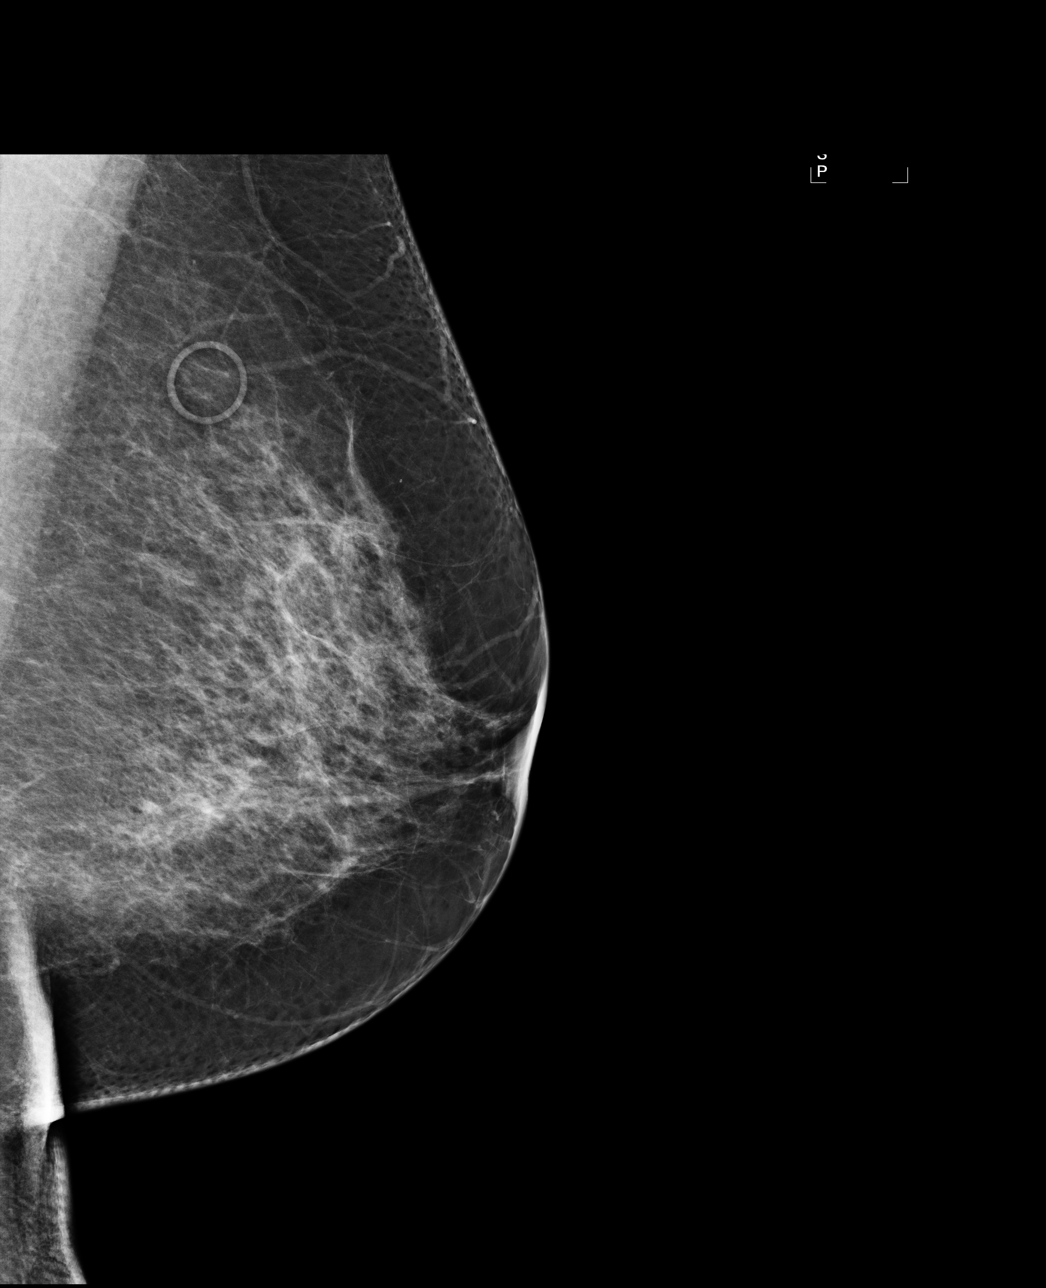

[R MLO]
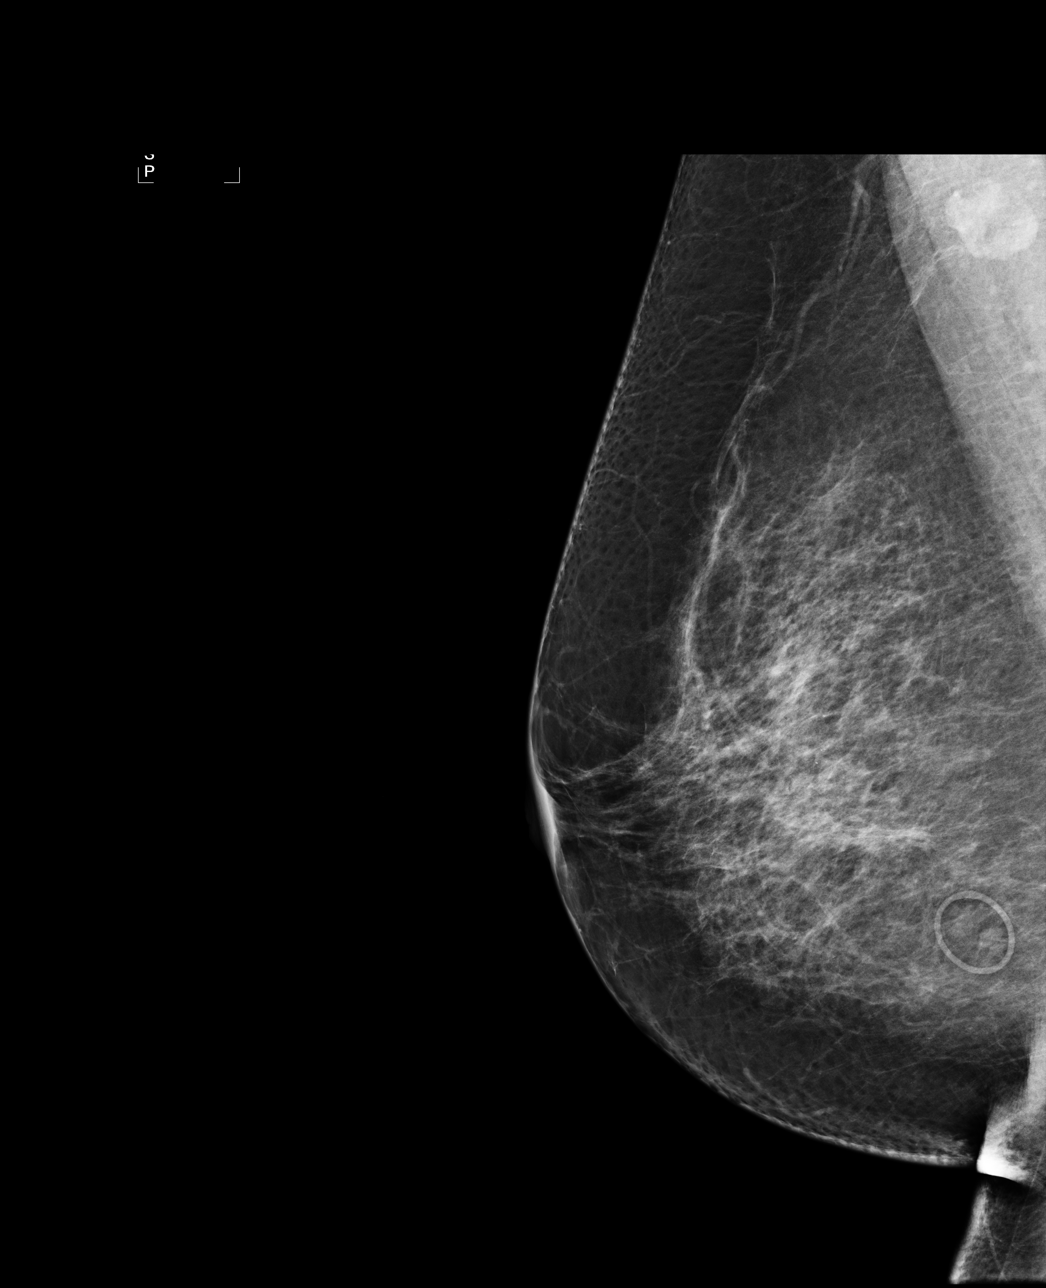

[4 of 4 positions shown; findings below may reference images not displayed]

FINDINGS: There is a fibroglandular pattern.  No masses or
malignant type calcifications are seen.  Compared with priors.
Mammographic images were processed with CAD.

On physical exam, no mass is palpated in the left breast.  No
discharge is elicited from the left nipple despite multiple
attempts including the use of warm compresses.  The patient is
instructed to come back to the [REDACTED] if the nipple
discharge returns .
IMPRESSION: No mammographic evidence of malignancy.  Routine screening
mammography is recommended in 1 year.

BI-RADS CATEGORY 1:  Negative.

## 2010-04-05 ENCOUNTER — Encounter: Payer: Self-pay | Admitting: Family Medicine

## 2010-10-16 ENCOUNTER — Other Ambulatory Visit: Payer: Self-pay | Admitting: Obstetrics and Gynecology

## 2010-10-16 DIAGNOSIS — Z1231 Encounter for screening mammogram for malignant neoplasm of breast: Secondary | ICD-10-CM

## 2010-10-23 ENCOUNTER — Ambulatory Visit: Payer: Self-pay

## 2010-11-13 ENCOUNTER — Ambulatory Visit
Admission: RE | Admit: 2010-11-13 | Discharge: 2010-11-13 | Disposition: A | Discharge status: Home / Self Care | Payer: BC Managed Care – PPO | Source: Ambulatory Visit | Attending: Obstetrics and Gynecology | Admitting: Obstetrics and Gynecology

## 2010-11-13 DIAGNOSIS — Z1231 Encounter for screening mammogram for malignant neoplasm of breast: Secondary | ICD-10-CM

## 2010-11-13 IMAGING — MG MM DIGITAL SCREENING {BC[facility]}
4 series · 4 of 4 positions shown · non-contrast
Comparison: none

DG SCREEN MAMMOGRAM BILATERAL
Bilateral CC and MLO view(s) were taken.

DIGITAL SCREENING MAMMOGRAM WITH CAD:
There are scattered fibroglandular densities.  No masses or malignant type calcifications are 
identified.  Compared with prior studies.
Images were processed with CAD.

[R CC]
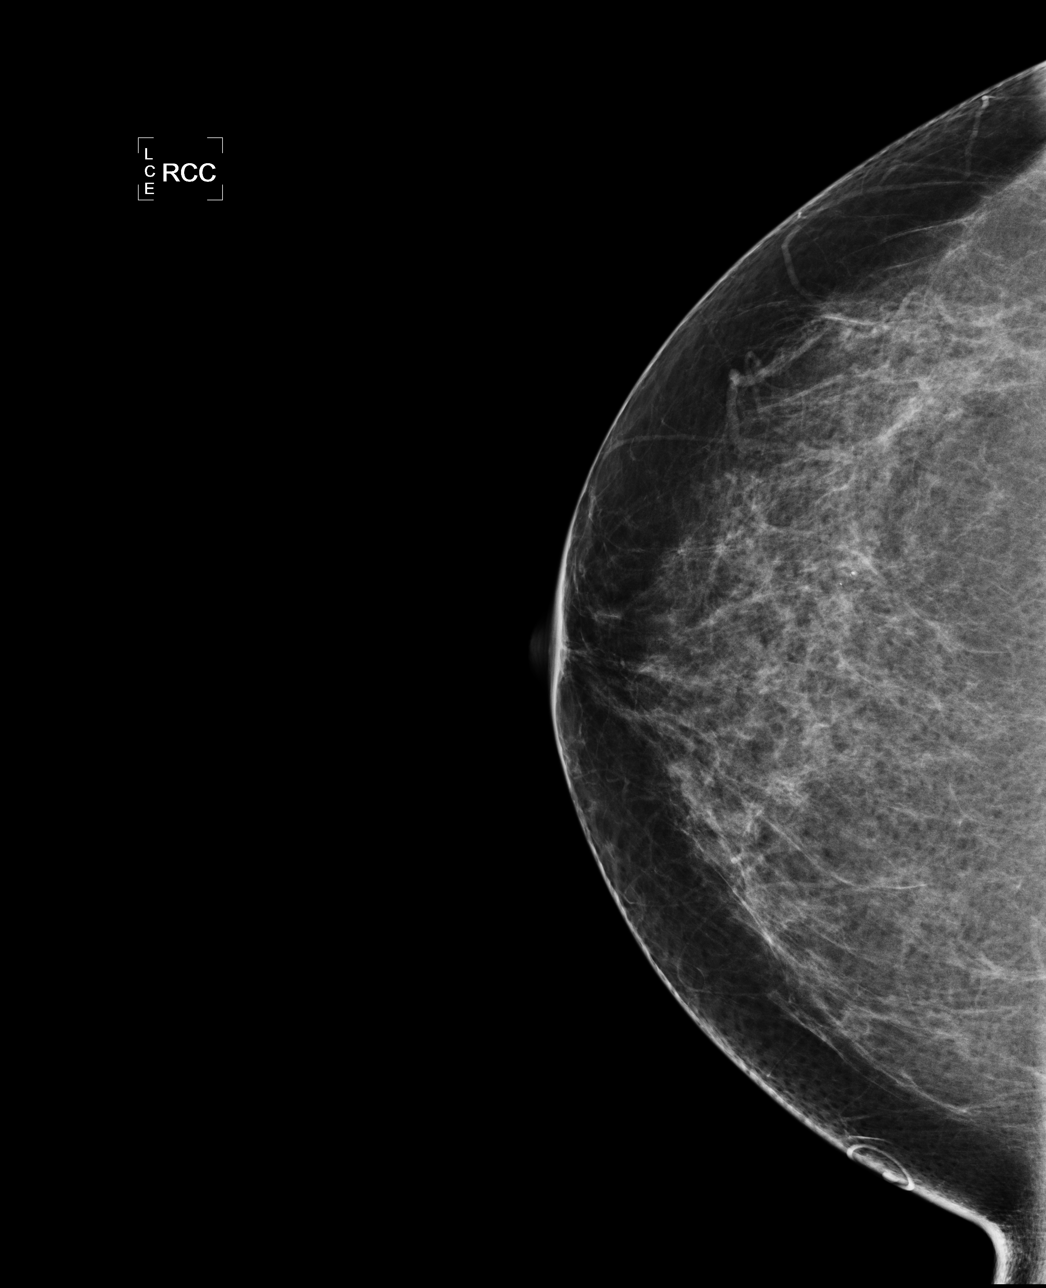

[L CC]
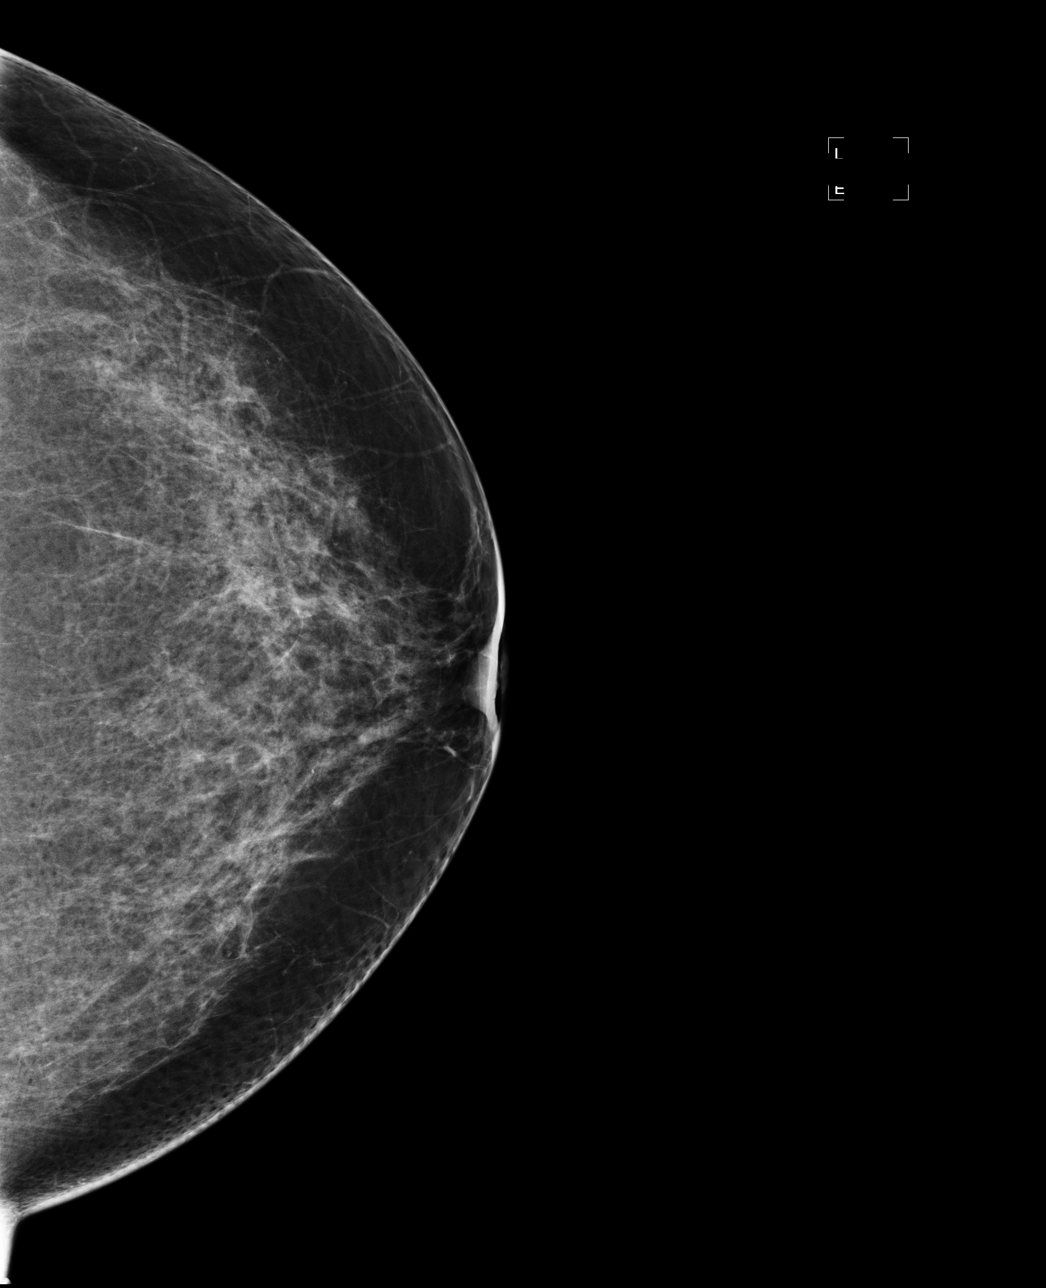

[L MLO]
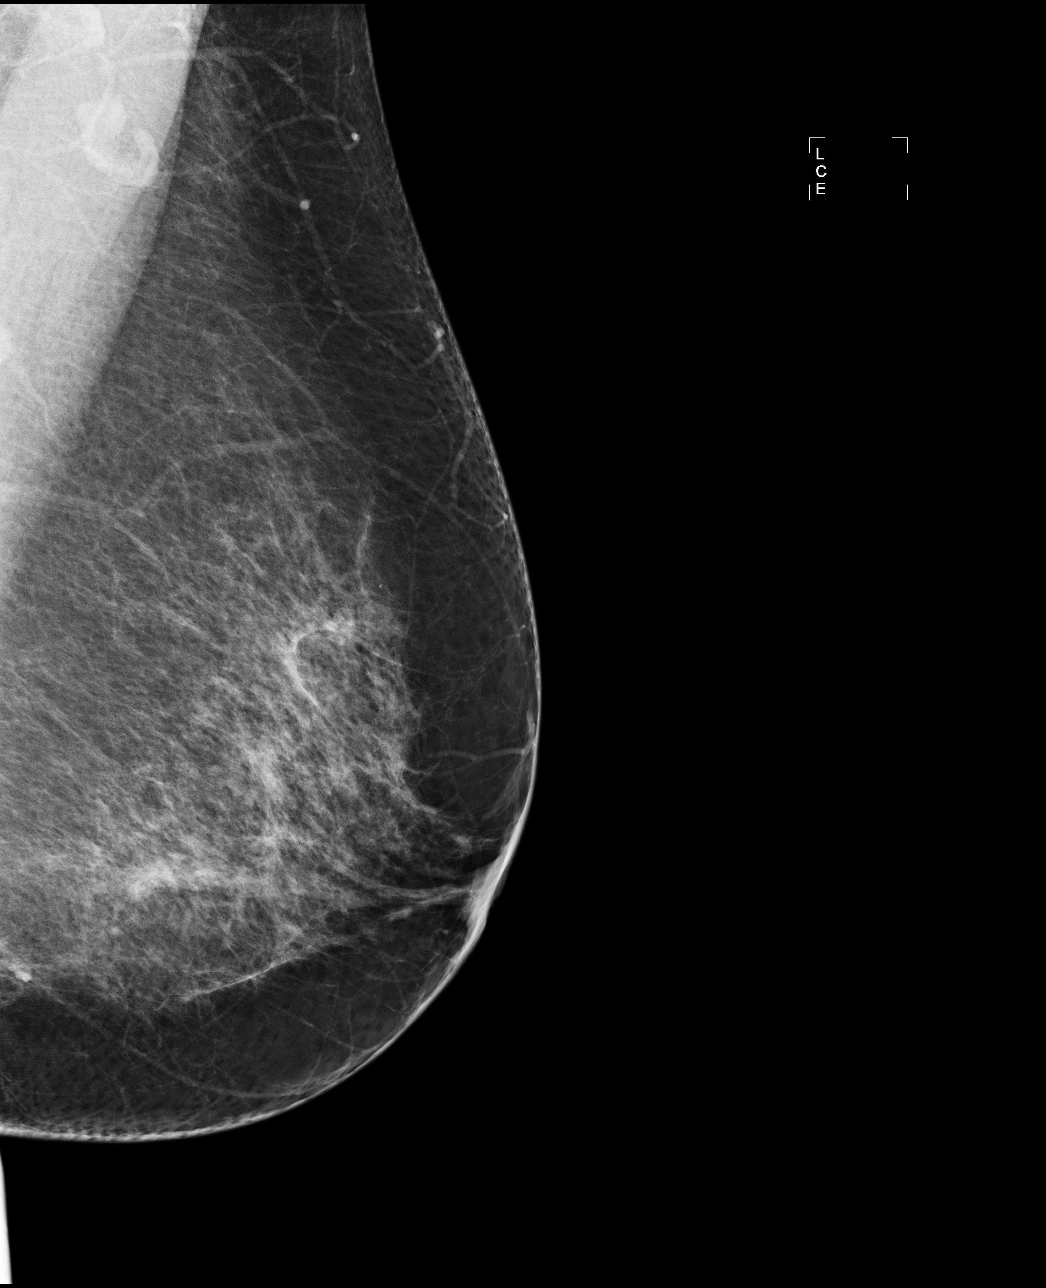

[R MLO]
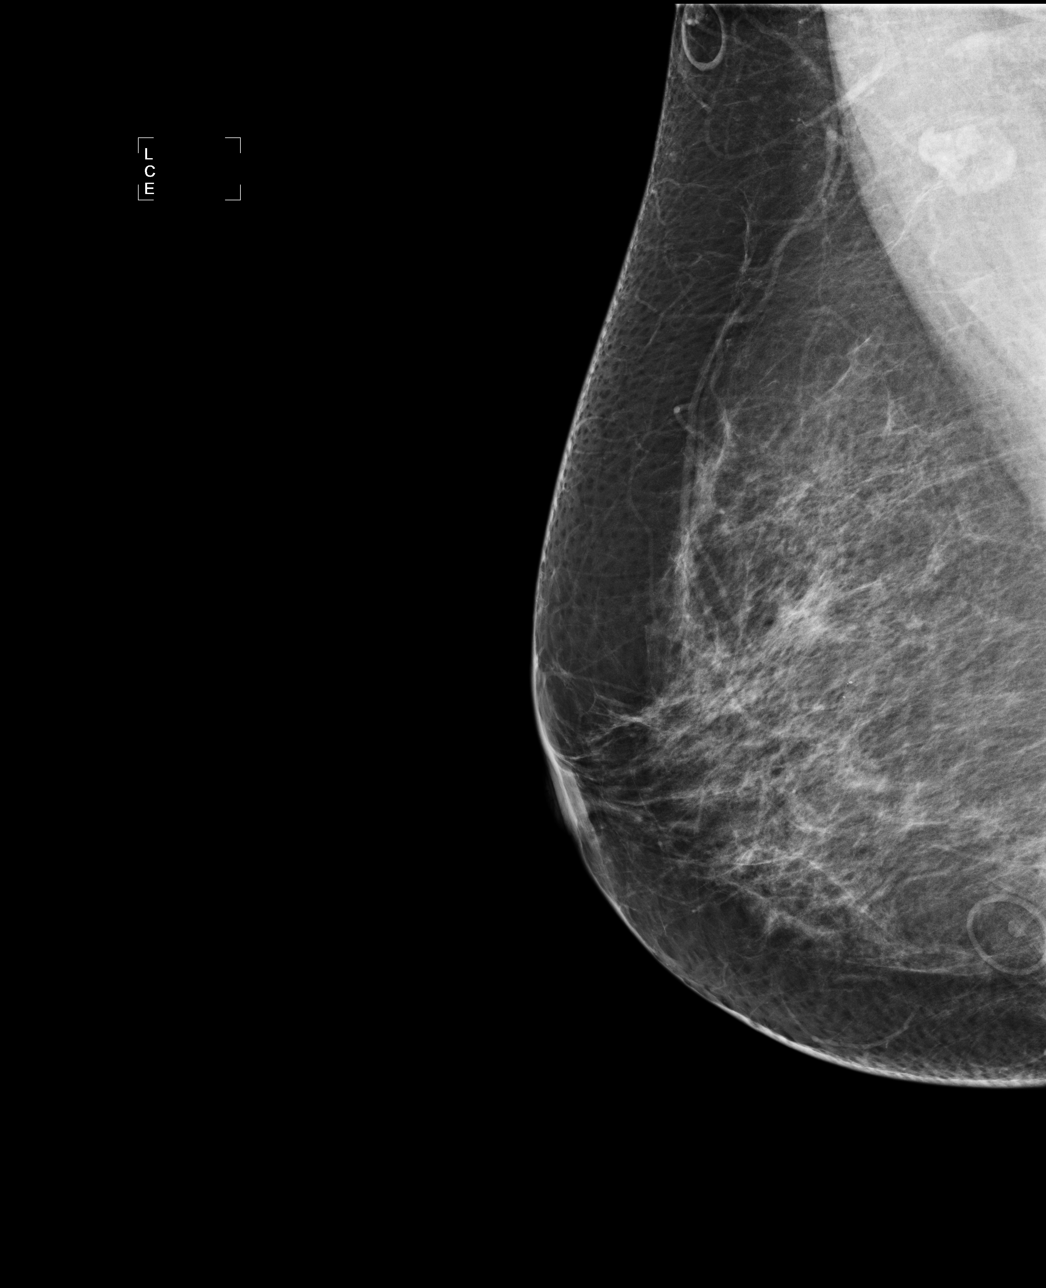

[4 of 4 positions shown; findings below may reference images not displayed]

IMPRESSION: No specific mammographic evidence of malignancy.  Next screening mammogram is recommended in one 
year.

A result letter of this screening mammogram will be mailed directly to the patient.

ASSESSMENT: Negative - BI-RADS 1

Screening mammogram in 1 year.
,

## 2010-11-19 HISTORY — PX: COLONOSCOPY: SHX174

## 2011-05-07 ENCOUNTER — Other Ambulatory Visit: Payer: Self-pay | Admitting: Obstetrics and Gynecology

## 2011-05-07 DIAGNOSIS — N6452 Nipple discharge: Secondary | ICD-10-CM

## 2011-05-15 ENCOUNTER — Other Ambulatory Visit: Payer: Self-pay | Admitting: Obstetrics and Gynecology

## 2011-05-15 ENCOUNTER — Ambulatory Visit
Admission: RE | Admit: 2011-05-15 | Discharge: 2011-05-15 | Disposition: A | Discharge status: Home / Self Care | Payer: BC Managed Care – PPO | Source: Ambulatory Visit | Attending: Obstetrics and Gynecology | Admitting: Obstetrics and Gynecology

## 2011-05-15 DIAGNOSIS — N6452 Nipple discharge: Secondary | ICD-10-CM

## 2011-05-15 IMAGING — MG MM DUCTOGRAM UNILATERAL *L*
2 series · 2 of 2 positions shown · non-contrast
Comparison: [DATE], [DATE], [DATE]

CLINICAL DATA: The patient has been experiencing intermittent
bloody nipple discharge on the left.

DIGITAL DIAGNOSTIC LEFT MAMMOGRAM WITH CAD

[L CC]
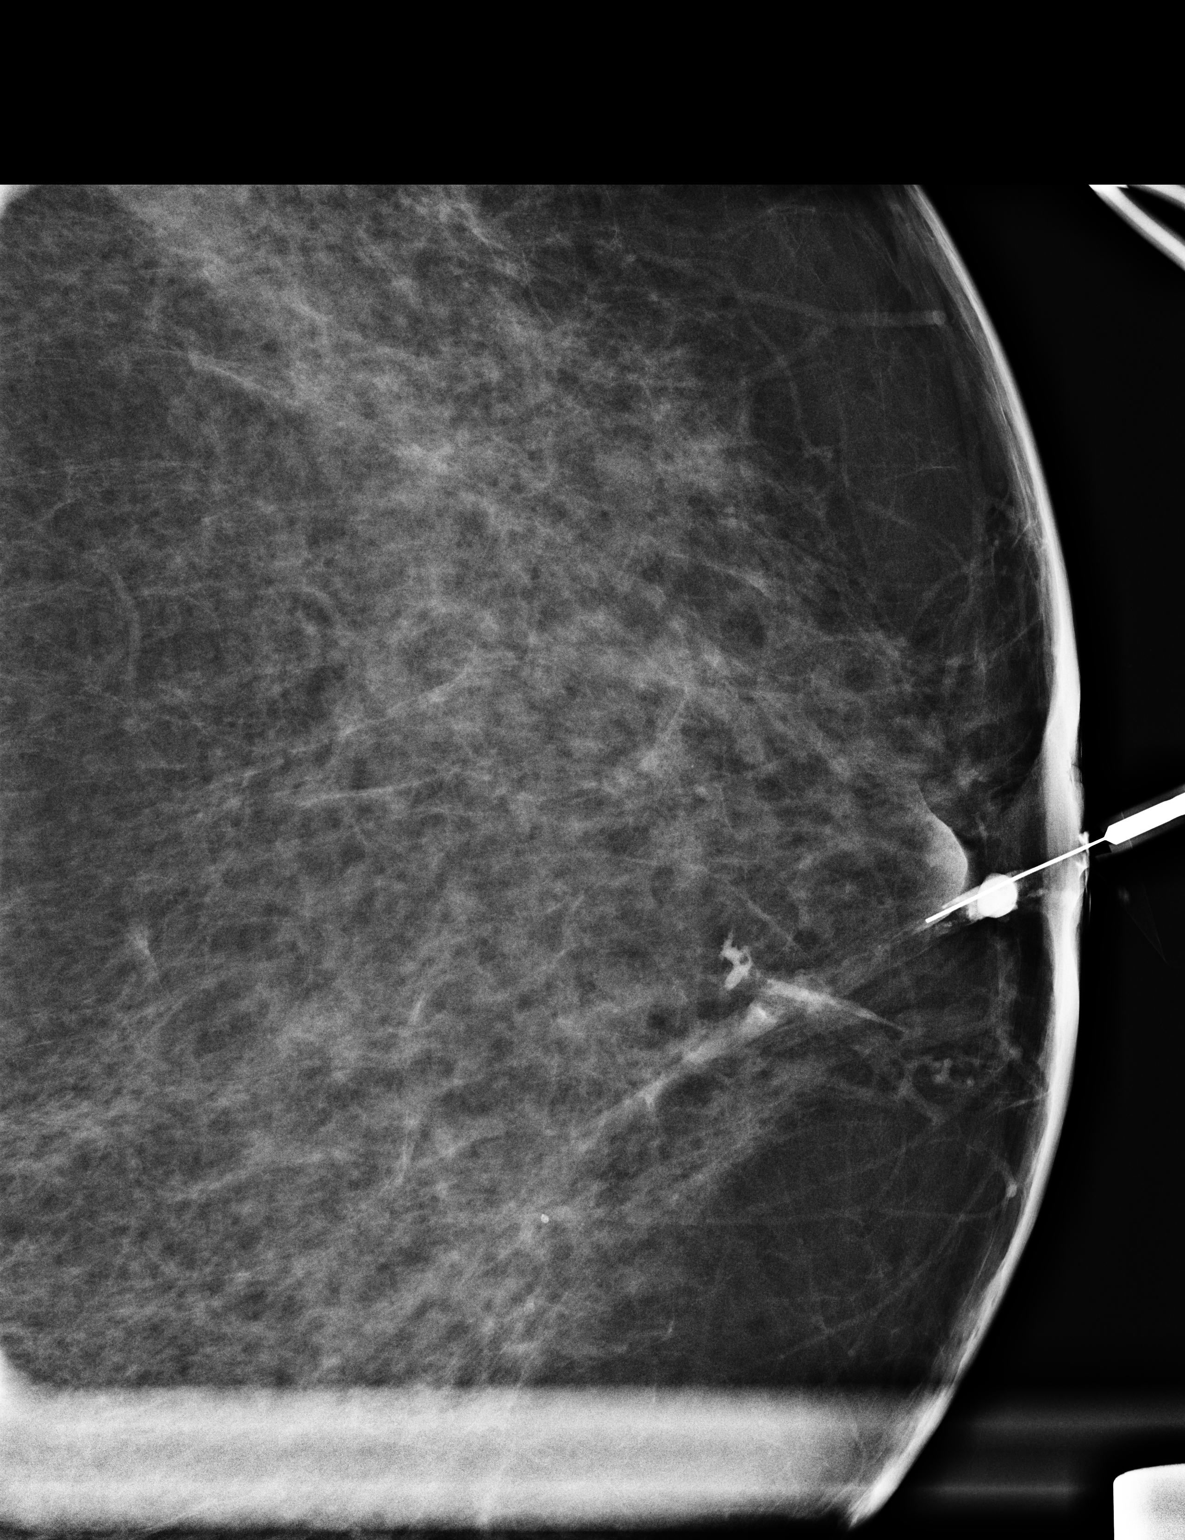

[L ML]
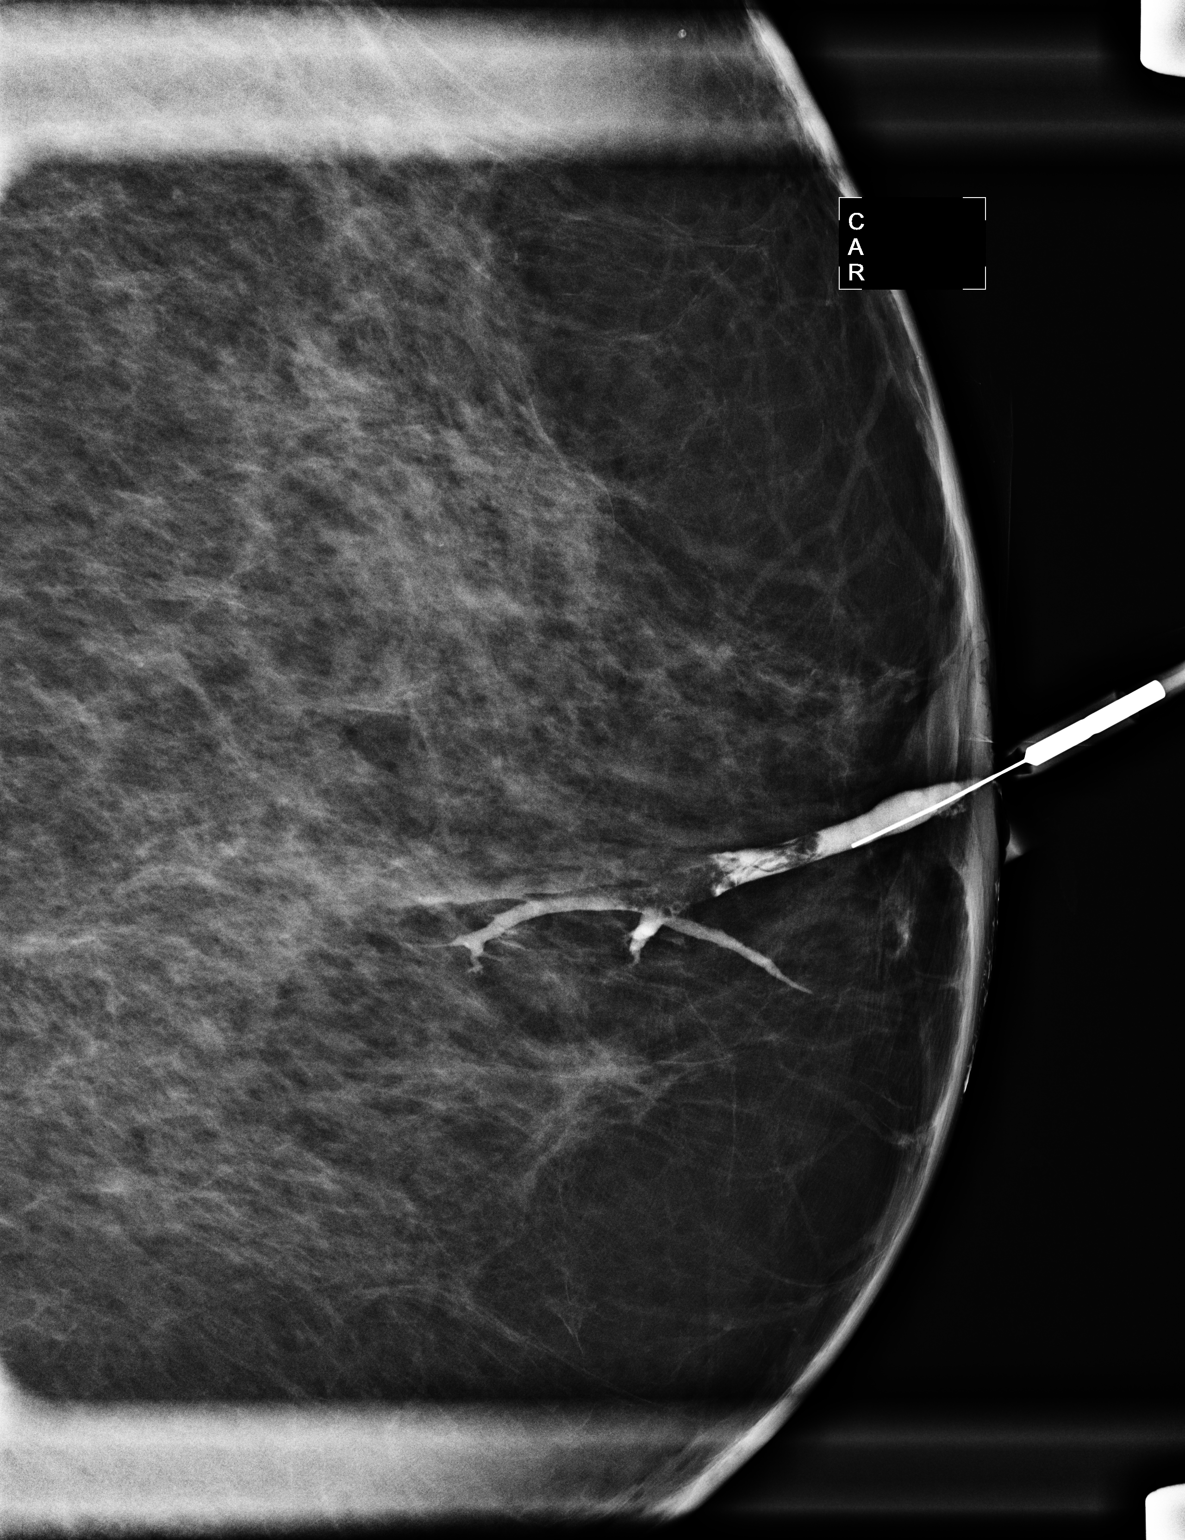

[2 of 2 positions shown; findings below may reference images not displayed]

FINDINGS: There are scattered fibroglandular densities.  There is
a solitary dilated duct in the left subareolar region at 9 o'clock.
No suspicious mass, distortion or calcification to suggest
malignancy is identified elsewhere in the left breast.
Mammographic images were processed with CAD.

On physical examination, there is a small amount of blood tinged
discharge arising from a duct at 9 o'clock on the left.
Galactography is suggested.  The procedure was explained to the
patient and verbal consent granted.

The discharging duct was cannulated with a 30 gauge probe.
Approximately 0.1 ml of Omnipaque was injected.

CC and MLO magnification views demonstrate an intraductal filling
defect measuring approximately 17 mm in length and located
approximately 18 mm deep to the nipple within the dilated duct at 9
o'clock.

Surgical consultation for consideration of excision is recommended.
IMPRESSION: Intraductal filling defect in a dilated subareolar duct on the left
at 9 o'clock.  Surgical consultation has been scheduled with Dr.
CAESTECKER for [DATE].

BI-RADS CATEGORY 4:  Suspicious abnormality - biopsy should be
considered.

## 2011-05-15 IMAGING — MG MM DIAGNOSTIC UNILATERAL L
2 series · 2 of 2 positions shown · non-contrast
Comparison: [DATE], [DATE], [DATE]

CLINICAL DATA: The patient has been experiencing intermittent
bloody nipple discharge on the left.

DIGITAL DIAGNOSTIC LEFT MAMMOGRAM WITH CAD

[L CC]
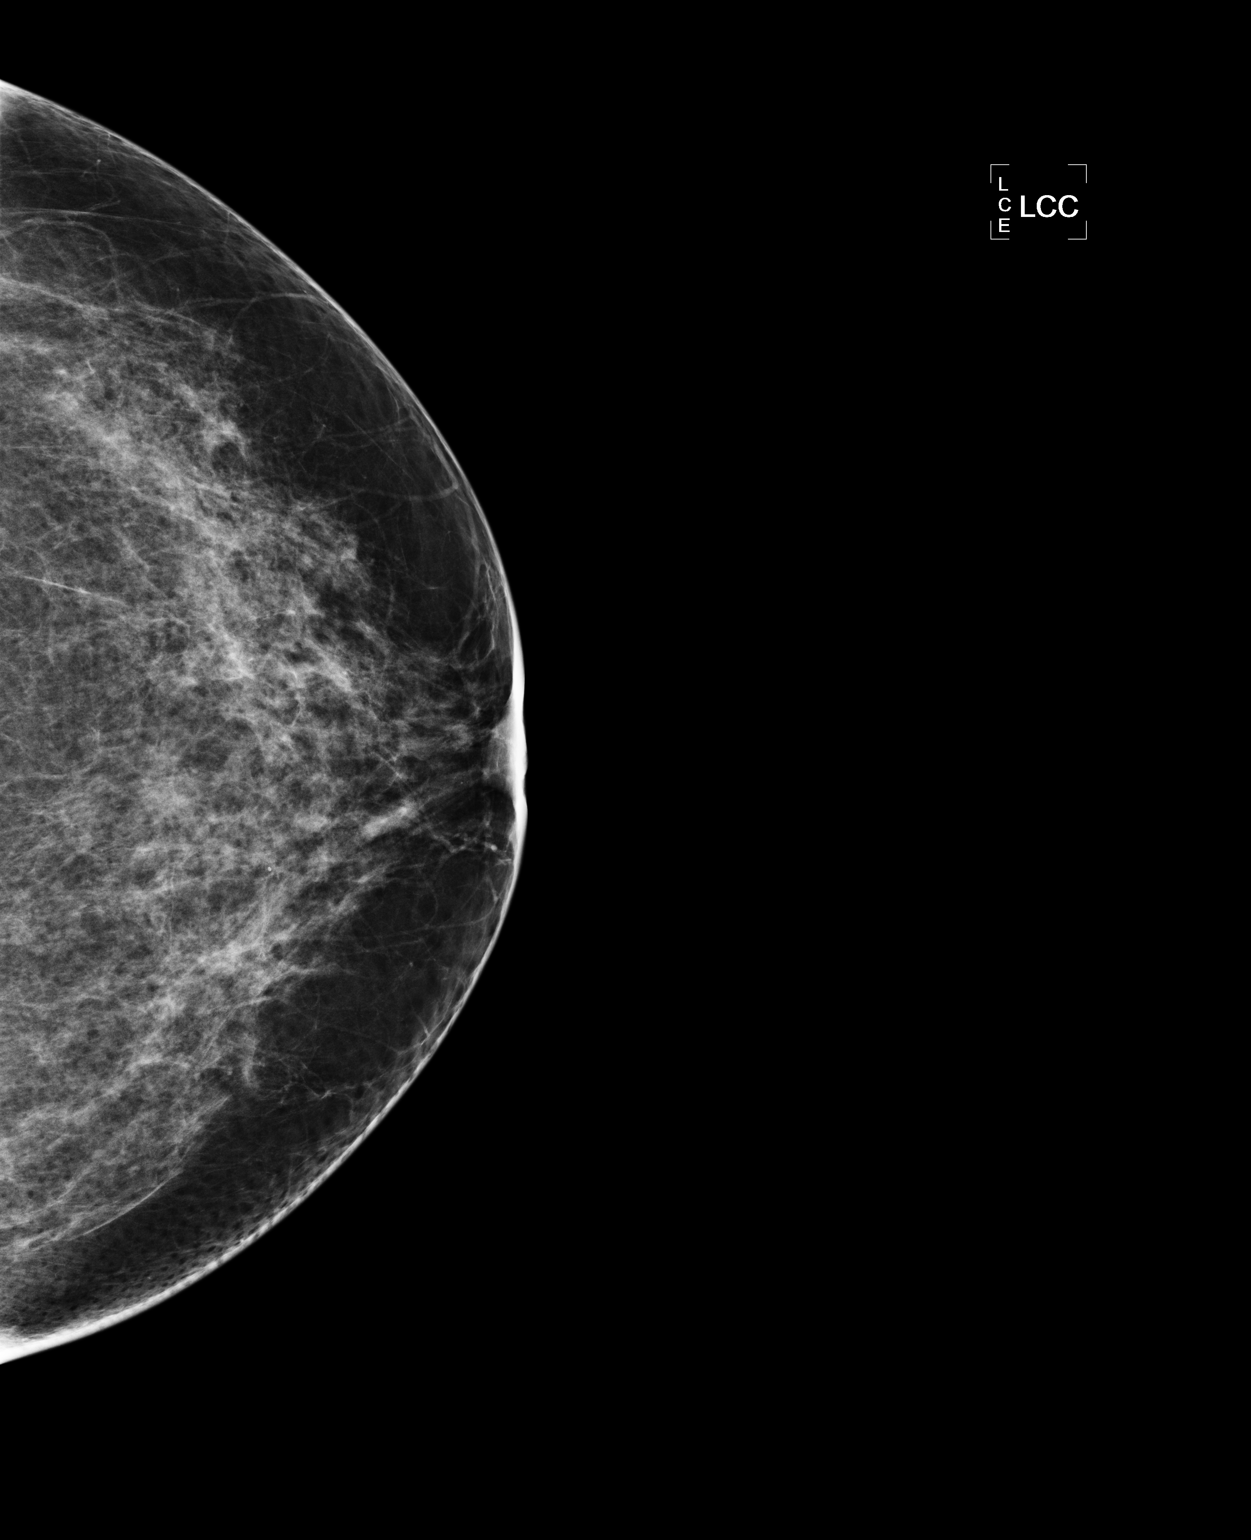

[L MLO]
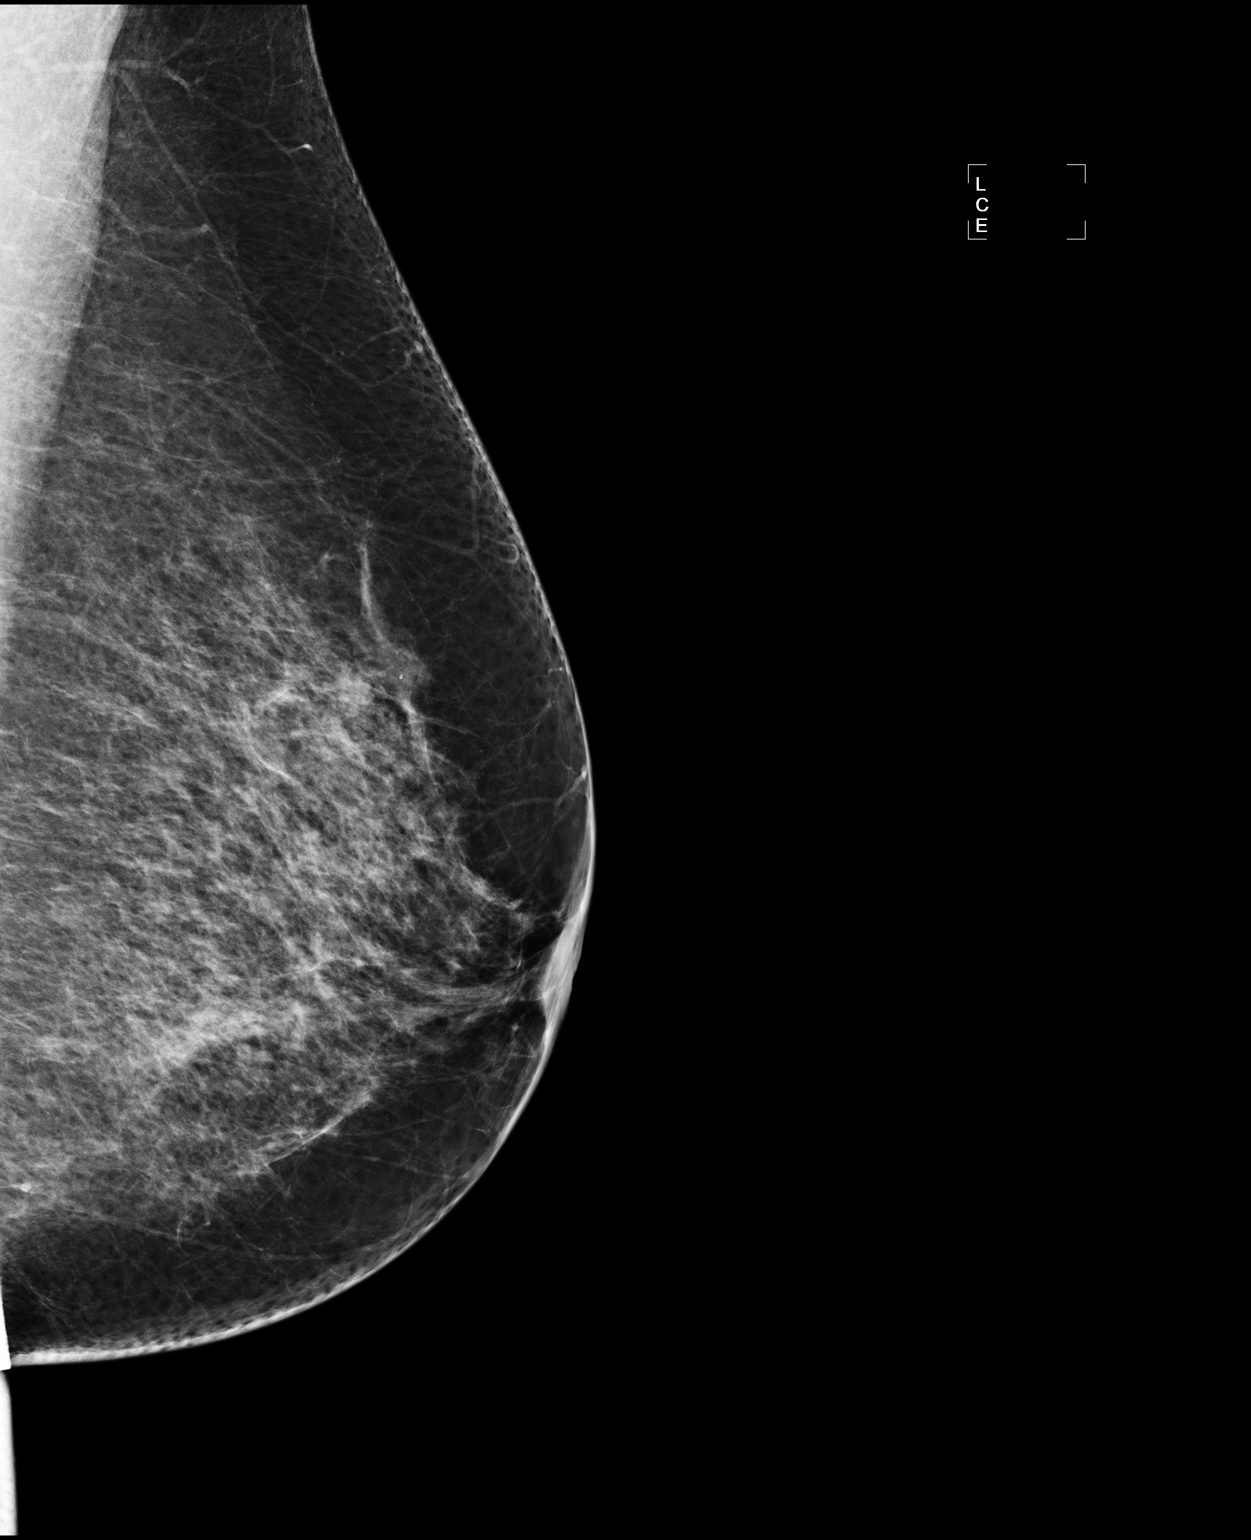

[2 of 2 positions shown; findings below may reference images not displayed]

FINDINGS: There are scattered fibroglandular densities.  There is
a solitary dilated duct in the left subareolar region at 9 o'clock.
No suspicious mass, distortion or calcification to suggest
malignancy is identified elsewhere in the left breast.
Mammographic images were processed with CAD.

On physical examination, there is a small amount of blood tinged
discharge arising from a duct at 9 o'clock on the left.
Galactography is suggested.  The procedure was explained to the
patient and verbal consent granted.

The discharging duct was cannulated with a 30 gauge probe.
Approximately 0.1 ml of Omnipaque was injected.

CC and MLO magnification views demonstrate an intraductal filling
defect measuring approximately 17 mm in length and located
approximately 18 mm deep to the nipple within the dilated duct at 9
o'clock.

Surgical consultation for consideration of excision is recommended.
IMPRESSION: Intraductal filling defect in a dilated subareolar duct on the left
at 9 o'clock.  Surgical consultation has been scheduled with Dr.
CAESTECKER for [DATE].

BI-RADS CATEGORY 4:  Suspicious abnormality - biopsy should be
considered.

## 2011-06-03 ENCOUNTER — Ambulatory Visit (INDEPENDENT_AMBULATORY_CARE_PROVIDER_SITE_OTHER): Payer: BC Managed Care – PPO | Admitting: Surgery

## 2011-06-03 ENCOUNTER — Encounter (INDEPENDENT_AMBULATORY_CARE_PROVIDER_SITE_OTHER): Payer: Self-pay | Admitting: Surgery

## 2011-06-03 VITALS — BP 146/82 | HR 60 | Temp 98.4°F | Resp 16 | Ht 66.0 in | Wt 181.6 lb

## 2011-06-03 DIAGNOSIS — D369 Benign neoplasm, unspecified site: Secondary | ICD-10-CM | POA: Insufficient documentation

## 2011-06-03 DIAGNOSIS — N6452 Nipple discharge: Secondary | ICD-10-CM

## 2011-06-03 DIAGNOSIS — N6459 Other signs and symptoms in breast: Secondary | ICD-10-CM

## 2011-06-03 NOTE — Patient Instructions (Signed)
We will schedule surgery to remove some of the milk duct tissue under the left nipple

## 2011-06-03 NOTE — Progress Notes (Signed)
Patient ID: Caroline White, female   DOB: 02-13-1951, 61 y.o.   MRN: [id number]  Chief Complaint  Patient presents with  . Breast Discharge    new pt- eval L breast abn ductogram... bloody nipple discharge   Referred by: Daryl Eastern, MD PCP; Thora Lance, MD, MD  HPI Caroline White is a 61 y.o. female.  This patient has had a clear left nipple discharge for about six or seven months. Recently it has become bloody. It is spontaneous. She's had no other breast symptoms or history of any other breast problems. She has a maternal grandmother who had breast cancer but no other family history of breast or ovarian cancer. She has not noticed a lump or mass in her breast. She had a mammogram which was unremarkable except for a dilated duct on the left. A ductogram showed a filling defect on the left. She was referred for surgical evaluation. HPI  Past Medical History  Diagnosis Date  . Diabetes mellitus     type II  . Hypertension   . Hyperlipidemia   . Nipple discharge     bloody from Lt breast    Past Surgical History  Procedure Date  . Abdominal hysterectomy 1998    full  . Tonsillectomy and adenoidectomy 1957    Family History  Problem Relation Age of Onset  . Cancer Mother     uterine and stomach  . Stroke Father   . Cancer Maternal Grandmother     breast    Social History History  Substance Use Topics  . Smoking status: Never Smoker   . Smokeless tobacco: Not on file  . Alcohol Use: No    Allergies  Allergen Reactions  . Sulfa Drugs Cross Reactors     Current Outpatient Prescriptions  Medication Sig Dispense Refill  . losartan-hydrochlorothiazide (HYZAAR) 100-12.5 MG per tablet daily.      . Multiple Vitamin (MULTIVITAMIN) capsule Take 1 capsule by mouth daily.      . pravastatin (PRAVACHOL) 40 MG tablet daily.        Review of Systems Review of Systems  Constitutional: Negative for fever, chills and unexpected weight change.  HENT:  Negative for hearing loss, congestion, sore throat, trouble swallowing and voice change.   Eyes: Negative for visual disturbance.  Respiratory: Negative for cough and wheezing.   Cardiovascular: Negative for chest pain, palpitations and leg swelling.  Gastrointestinal: Negative for nausea, vomiting, abdominal pain, diarrhea, constipation, blood in stool, abdominal distention and anal bleeding.  Genitourinary: Negative for hematuria, vaginal bleeding and difficulty urinating.  Musculoskeletal: Negative for arthralgias.  Skin: Negative for rash and wound.       Psoriasis  Neurological: Negative for seizures, syncope and headaches.  Hematological: Negative for adenopathy. Does not bruise/bleed easily.  Psychiatric/Behavioral: Negative for confusion.    Blood pressure 146/82, pulse 60, temperature 98.4 F (36.9 C), temperature source Temporal, resp. rate 16, height 5\' 6"  (1.676 m), weight 181 lb 9.6 oz (82.373 kg).  Physical Exam Physical Exam  Vitals reviewed. Constitutional: She is oriented to person, place, and time. She appears well-developed and well-nourished. No distress.  HENT:  Head: Normocephalic and atraumatic.  Mouth/Throat: Oropharynx is clear and moist.       Needs dental work  Eyes: Conjunctivae and EOM are normal. Pupils are equal, round, and reactive to light. No scleral icterus.  Neck: Normal range of motion. Neck supple. No tracheal deviation present. No thyromegaly present.  Cardiovascular: Normal rate, regular rhythm, normal  heart sounds and intact distal pulses.  Exam reveals no gallop and no friction rub.   No murmur heard. Pulmonary/Chest: Effort normal and breath sounds normal. No respiratory distress. She has no wheezes. She has no rales.       Nipple discharge left, 9:00 position, clear today, readily expressed. Breasts negative to exam otherwise.Lymphatics negative  Abdominal: Soft. Bowel sounds are normal. She exhibits no distension and no mass. There is no  tenderness. There is no rebound and no guarding.  Musculoskeletal: Normal range of motion. She exhibits no edema and no tenderness.  Neurological: She is alert and oriented to person, place, and time.  Skin: Skin is warm and dry. No rash noted. She is not diaphoretic. No erythema.  Psychiatric: She has a normal mood and affect. Her behavior is normal. Judgment and thought content normal.    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 I have discussed the procedure, risks and complications with the patient as well as the alternative of f/u. She wants to go ahead and schedule       Sheenah Dimitroff J 06/03/2011, 9:15 AM

## 2011-06-04 ENCOUNTER — Encounter (HOSPITAL_COMMUNITY): Payer: Self-pay | Admitting: Pharmacy Technician

## 2011-06-09 ENCOUNTER — Telehealth (INDEPENDENT_AMBULATORY_CARE_PROVIDER_SITE_OTHER): Payer: Self-pay | Admitting: Surgery

## 2011-06-09 NOTE — Telephone Encounter (Signed)
Made patient aware a lipid panel isn't part of a preop lab set. She will keep appt with her medical MD.

## 2011-06-16 ENCOUNTER — Encounter (HOSPITAL_COMMUNITY): Payer: Self-pay

## 2011-06-16 ENCOUNTER — Other Ambulatory Visit: Payer: Self-pay

## 2011-06-16 ENCOUNTER — Encounter (HOSPITAL_COMMUNITY)
Admission: RE | Admit: 2011-06-16 | Discharge: 2011-06-16 | Disposition: A | Discharge status: Home / Self Care | Payer: BC Managed Care – PPO | Source: Ambulatory Visit | Attending: Surgery | Admitting: Surgery

## 2011-06-16 ENCOUNTER — Ambulatory Visit (HOSPITAL_COMMUNITY)
Admission: RE | Admit: 2011-06-16 | Discharge: 2011-06-16 | Disposition: A | Discharge status: Home / Self Care | Payer: BC Managed Care – PPO | Source: Ambulatory Visit | Attending: Surgery | Admitting: Surgery

## 2011-06-16 DIAGNOSIS — Z01812 Encounter for preprocedural laboratory examination: Secondary | ICD-10-CM | POA: Insufficient documentation

## 2011-06-16 DIAGNOSIS — I10 Essential (primary) hypertension: Secondary | ICD-10-CM | POA: Insufficient documentation

## 2011-06-16 DIAGNOSIS — Z01818 Encounter for other preprocedural examination: Secondary | ICD-10-CM | POA: Insufficient documentation

## 2011-06-16 DIAGNOSIS — Z01810 Encounter for preprocedural cardiovascular examination: Secondary | ICD-10-CM | POA: Insufficient documentation

## 2011-06-16 DIAGNOSIS — R9431 Abnormal electrocardiogram [ECG] [EKG]: Secondary | ICD-10-CM | POA: Insufficient documentation

## 2011-06-16 HISTORY — DX: Personal history of urinary calculi: Z87.442

## 2011-06-16 HISTORY — DX: Psoriasis, unspecified: L40.9

## 2011-06-16 LAB — BASIC METABOLIC PANEL
GFR calc Af Amer: >90 mL/min (ref >90)
GFR calc non Af Amer: >90 mL/min (ref >90)
Potassium: 4 mEq/L (ref 3.5–5.1)
Sodium: 141 mEq/L (ref 135–145)

## 2011-06-16 LAB — CBC
Hemoglobin: 13.3 g/dL (ref 12.0–15.0)
MCHC: 32.8 g/dL (ref 30.0–36.0)
RDW: 13.9 % (ref 11.5–15.5)

## 2011-06-16 LAB — SURGICAL PCR SCREEN
MRSA, PCR: NEGATIVE
Staphylococcus aureus: NEGATIVE

## 2011-06-16 IMAGING — CR DG CHEST 2V
2 series · 2 of 2 positions shown · non-contrast
Comparison: [DATE]

CLINICAL DATA: Hypertension, preop.

CHEST - 2 VIEW

[view not recorded (1 of 2)]
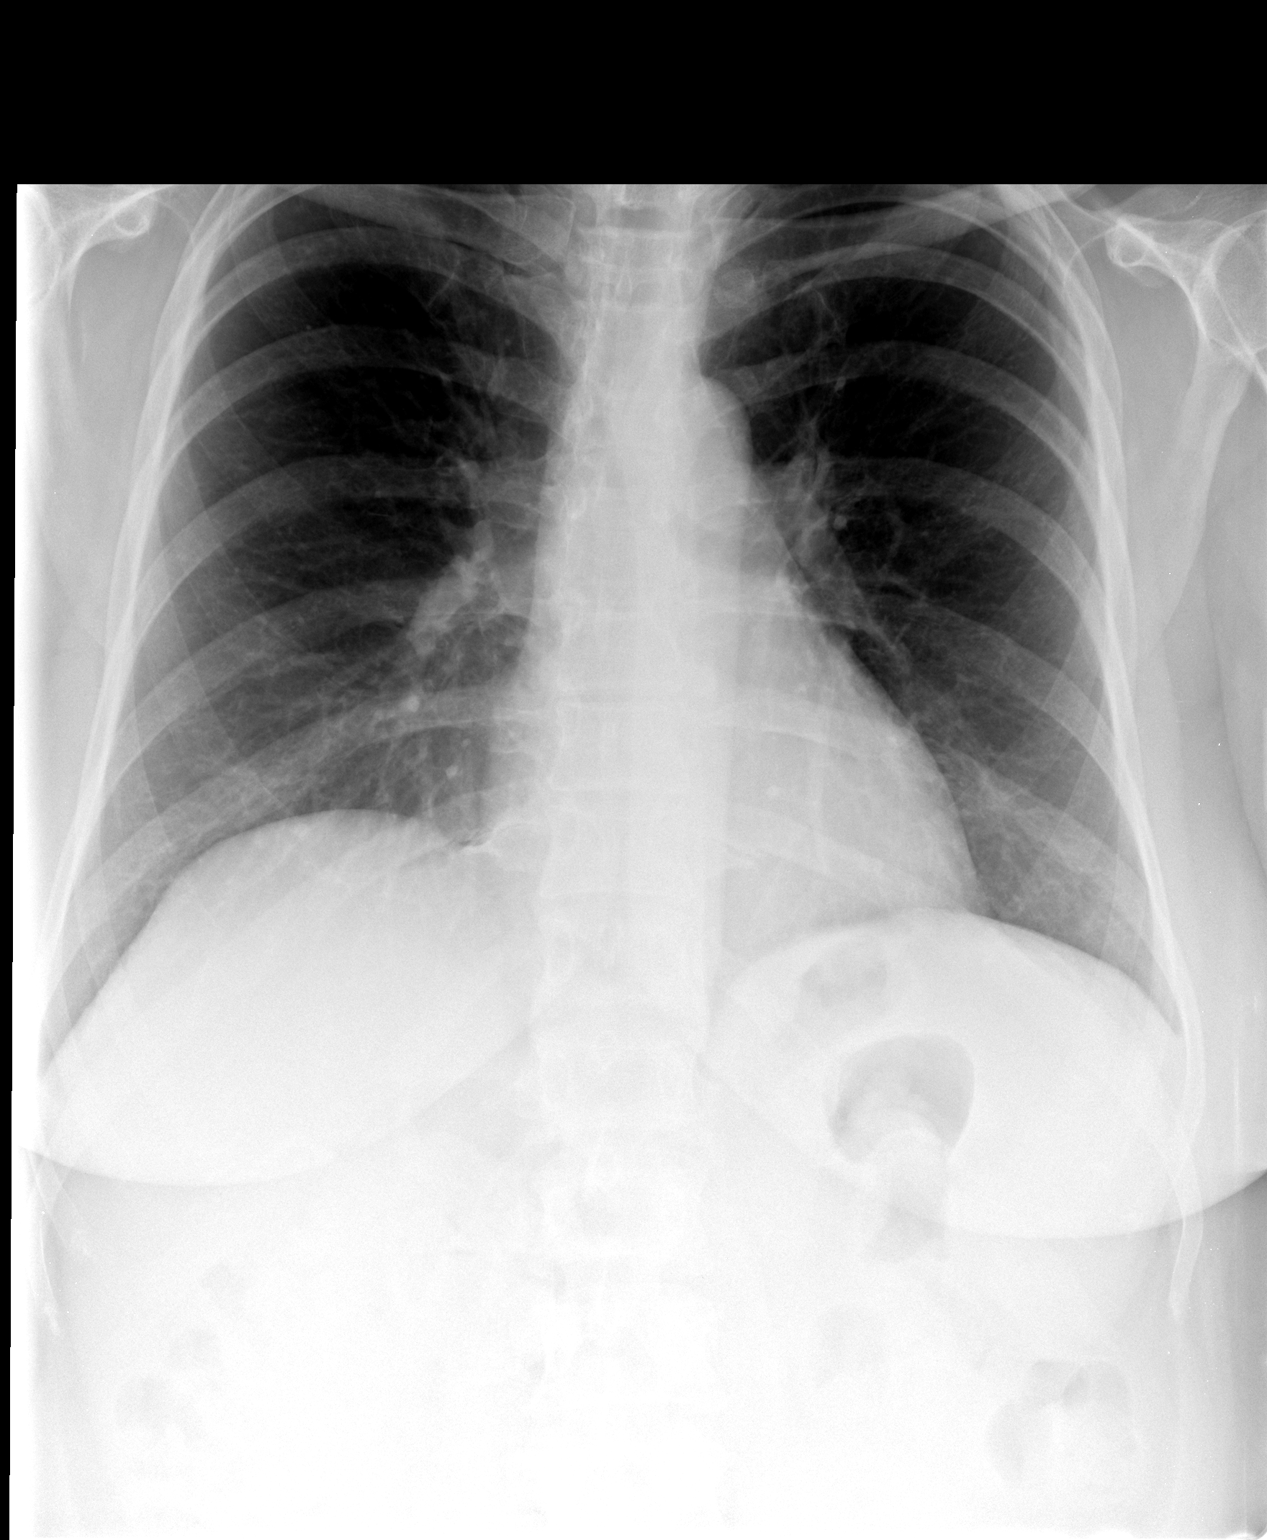

[view not recorded (2 of 2)]
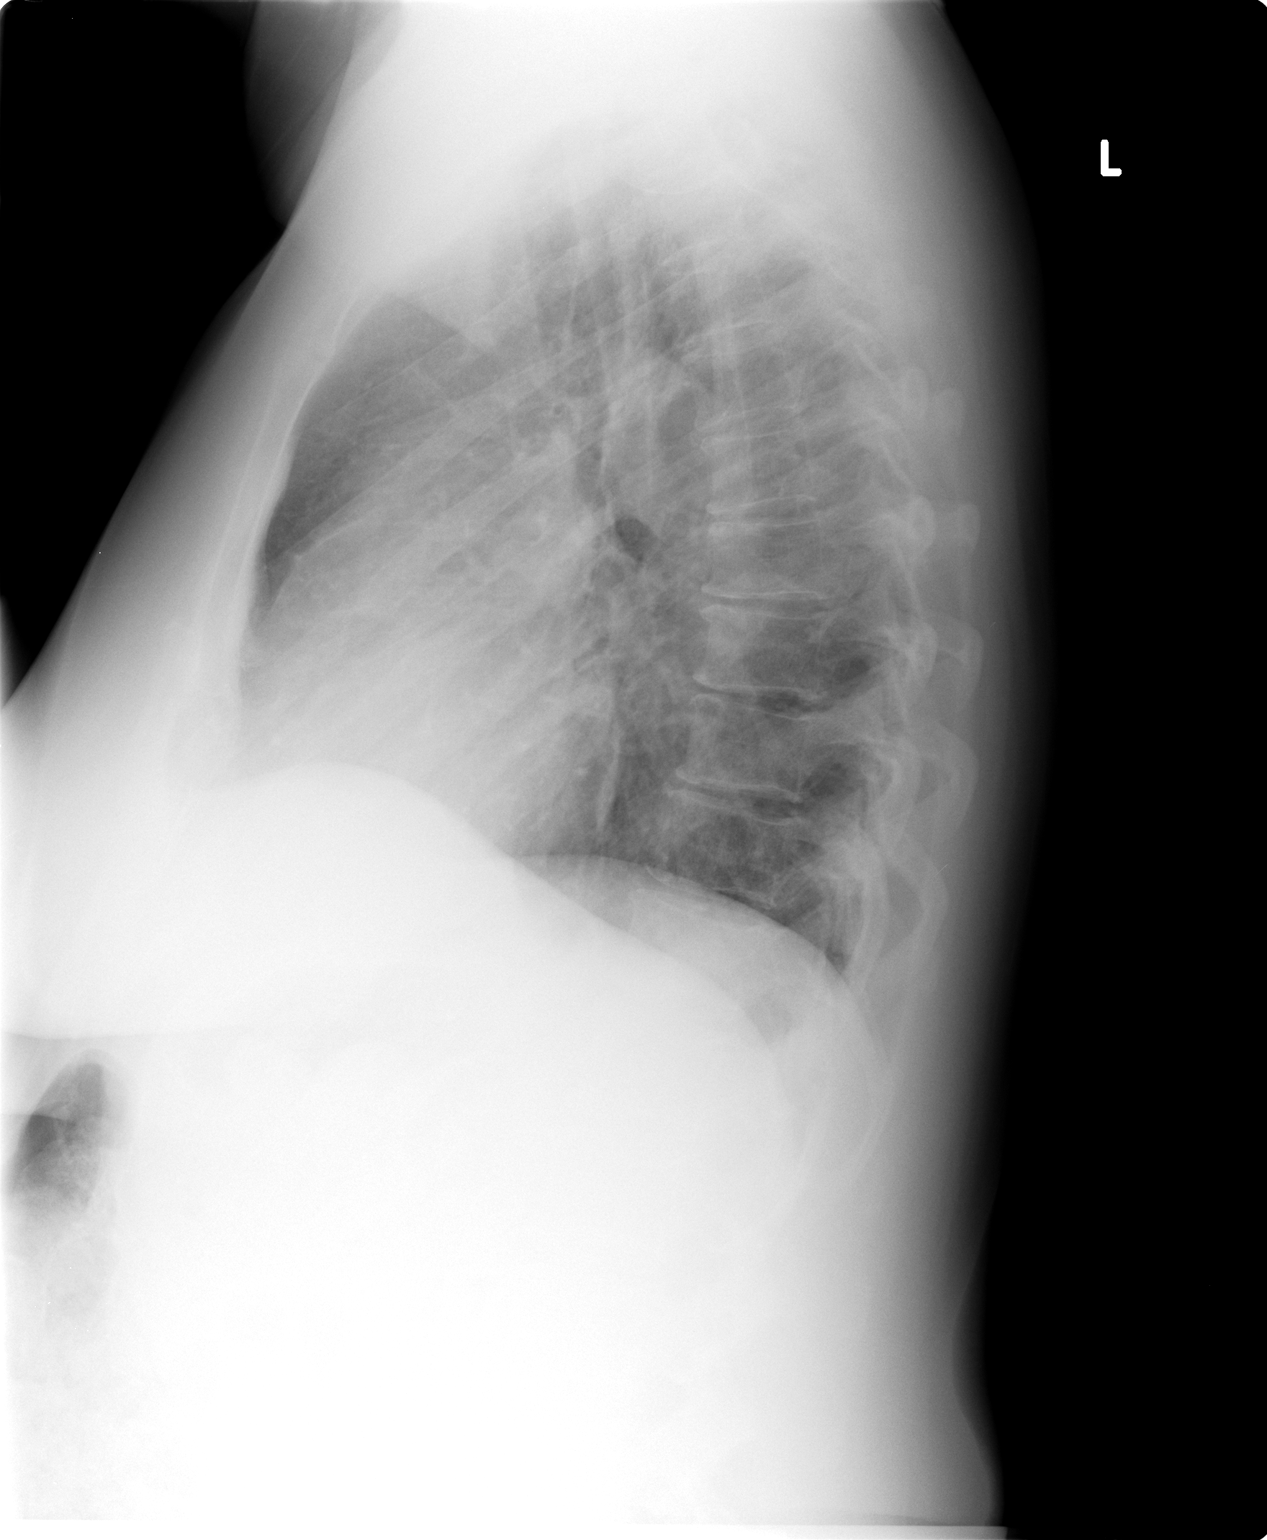

[2 of 2 positions shown; findings below may reference images not displayed]

FINDINGS: Heart and mediastinal contours are within normal limits.
No focal opacities or effusions.  No acute bony abnormality.
IMPRESSION: No active cardiopulmonary disease.

## 2011-06-16 NOTE — Patient Instructions (Signed)
YOUR SURGERY IS SCHEDULED ON:  Thursday  4/4  AT 12:00-PM  REPORT TO [facility] AT:  10:00 AM      PHONE # FOR SHORT STAY IS [phone]  DO NOT EAT OR DRINK ANYTHING AFTER MIDNIGHT THE NIGHT BEFORE YOUR SURGERY.  YOU MAY BRUSH YOUR TEETH, RINSE OUT YOUR MOUTH--BUT NO WATER, NO FOOD, NO CHEWING GUM, NO MINTS, NO CANDIES, NO CHEWING TOBACCO.  PLEASE TAKE THE FOLLOWING MEDICATIONS THE AM OF YOUR SURGERY WITH A FEW SIPS OF WATER: PRAVASTATIN     DO NOT BRING VALUABLES, MONEY, CREDIT CARDS.  CONTACT LENS, DENTURES / PARTIALS, GLASSES SHOULD NOT BE WORN TO SURGERY AND IN MOST CASES-HEARING AIDS WILL NEED TO BE REMOVED.  BRING YOUR GLASSES CASE, ANY EQUIPMENT NEEDED FOR YOUR CONTACT LENS. FOR PATIENTS ADMITTED TO THE HOSPITAL--CHECK OUT TIME THE DAY OF DISCHARGE IS 11:00 AM.  ALL INPATIENT ROOMS ARE PRIVATE - WITH BATHROOM, TELEPHONE, TELEVISION AND WIFI INTERNET. IF YOU ARE BEING DISCHARGED THE SAME DAY OF YOUR SURGERY--YOU CAN NOT DRIVE YOURSELF HOME--AND SHOULD NOT GO HOME ALONE BY TAXI OR BUS.  NO DRIVING OR OPERATING MACHINERY FOR 24 HOURS FOLLOWING ANESTHESIA / PAIN MEDICATIONS.                            SPECIAL INSTRUCTIONS:  CHLORHEXIDINE SOAP SHOWER (other brand names are Betasept and Hibiclens ) PLEASE SHOWER WITH CHLORHEXIDINE THE NIGHT BEFORE YOUR SURGERY AND THE AM OF YOUR SURGERY. DO NOT USE CHLORHEXIDINE ON YOUR FACE OR PRIVATE AREAS--YOU MAY USE YOUR NORMAL SOAP THOSE AREAS AND YOUR NORMAL SHAMPOO.  WOMEN SHOULD AVOID SHAVING UNDER ARMS AND SHAVING LEGS 48 HOURS BEFORE USING CHLORHEXIDINE TO AVOID SKIN IRRITATION.  DO NOT USE IF ALLERGIC TO CHLORHEXIDINE.  PLEASE READ OVER ANY  FACT SHEETS THAT YOU WERE GIVEN: MRSA INFORMATION

## 2011-06-18 ENCOUNTER — Encounter (HOSPITAL_COMMUNITY): Payer: Self-pay | Admitting: *Deleted

## 2011-06-18 ENCOUNTER — Ambulatory Visit (HOSPITAL_COMMUNITY)
Admission: RE | Admit: 2011-06-18 | Discharge: 2011-06-18 | Disposition: A | Discharge status: Home / Self Care | Payer: BC Managed Care – PPO | Source: Ambulatory Visit | Attending: Surgery | Admitting: Surgery

## 2011-06-18 ENCOUNTER — Encounter (HOSPITAL_COMMUNITY)
Admission: RE | Disposition: A | Discharge status: Home / Self Care | Payer: Self-pay | Source: Ambulatory Visit | Attending: Surgery

## 2011-06-18 ENCOUNTER — Ambulatory Visit (HOSPITAL_COMMUNITY): Payer: BC Managed Care – PPO | Admitting: Anesthesiology

## 2011-06-18 ENCOUNTER — Encounter (HOSPITAL_COMMUNITY): Payer: Self-pay | Admitting: Anesthesiology

## 2011-06-18 DIAGNOSIS — N6019 Diffuse cystic mastopathy of unspecified breast: Secondary | ICD-10-CM

## 2011-06-18 DIAGNOSIS — D249 Benign neoplasm of unspecified breast: Secondary | ICD-10-CM

## 2011-06-18 DIAGNOSIS — E785 Hyperlipidemia, unspecified: Secondary | ICD-10-CM | POA: Insufficient documentation

## 2011-06-18 DIAGNOSIS — Z01812 Encounter for preprocedural laboratory examination: Secondary | ICD-10-CM | POA: Insufficient documentation

## 2011-06-18 DIAGNOSIS — E119 Type 2 diabetes mellitus without complications: Secondary | ICD-10-CM | POA: Insufficient documentation

## 2011-06-18 DIAGNOSIS — I10 Essential (primary) hypertension: Secondary | ICD-10-CM | POA: Insufficient documentation

## 2011-06-18 DIAGNOSIS — N6452 Nipple discharge: Secondary | ICD-10-CM

## 2011-06-18 DIAGNOSIS — Z79899 Other long term (current) drug therapy: Secondary | ICD-10-CM | POA: Insufficient documentation

## 2011-06-18 HISTORY — PX: BREAST DUCTAL SYSTEM EXCISION: SHX5242

## 2011-06-18 LAB — GLUCOSE, CAPILLARY: Glucose-Capillary: 98 mg/dL (ref 70–99)

## 2011-06-18 SURGERY — EXCISION DUCTAL SYSTEM BREAST
Anesthesia: General | Site: Breast | Laterality: Left

## 2011-06-18 MED ORDER — 0.9 % SODIUM CHLORIDE (POUR BTL) OPTIME
TOPICAL | Status: DC | PRN
  Administered 2011-06-18: 1000 mL

## 2011-06-18 MED ORDER — HYDROCODONE-ACETAMINOPHEN 5-325 MG PO TABS: 1.0000 | ORAL_TABLET | ORAL | Status: AC | PRN

## 2011-06-18 MED ORDER — METHYLENE BLUE 1 % INJ SOLN
INTRAMUSCULAR | Status: AC
  Filled 2011-06-18: qty 10

## 2011-06-18 MED ORDER — MIDAZOLAM HCL 5 MG/5ML IJ SOLN
INTRAMUSCULAR | Status: DC | PRN
  Administered 2011-06-18: 2 mg via INTRAVENOUS

## 2011-06-18 MED ORDER — CEFAZOLIN SODIUM-DEXTROSE 2-3 GM-% IV SOLR
INTRAVENOUS | Status: AC
  Filled 2011-06-18: qty 50

## 2011-06-18 MED ORDER — CEFAZOLIN SODIUM-DEXTROSE 2-3 GM-% IV SOLR
2.0000 g | Freq: Once | INTRAVENOUS | Status: AC
  Administered 2011-06-18: 2 g via INTRAVENOUS

## 2011-06-18 MED ORDER — LIDOCAINE HCL 1 % IJ SOLN
INTRAMUSCULAR | Status: AC
  Filled 2011-06-18: qty 20

## 2011-06-18 MED ORDER — LIDOCAINE HCL (CARDIAC) 20 MG/ML IV SOLN
INTRAVENOUS | Status: DC | PRN
  Administered 2011-06-18: 50 mg via INTRAVENOUS

## 2011-06-18 MED ORDER — METHYLENE BLUE 1 % INJ SOLN
INTRAMUSCULAR | Status: DC | PRN
  Administered 2011-06-18: 2 mL

## 2011-06-18 MED ORDER — BUPIVACAINE HCL 0.25 % IJ SOLN
INTRAMUSCULAR | Status: DC | PRN
  Administered 2011-06-18: 20 mL

## 2011-06-18 MED ORDER — DEXAMETHASONE SODIUM PHOSPHATE 10 MG/ML IJ SOLN
INTRAMUSCULAR | Status: DC | PRN
  Administered 2011-06-18: 10 mg via INTRAVENOUS

## 2011-06-18 MED ORDER — PROPOFOL 10 MG/ML IV BOLUS
INTRAVENOUS | Status: DC | PRN
  Administered 2011-06-18: 180 mg via INTRAVENOUS

## 2011-06-18 MED ORDER — FENTANYL CITRATE 0.05 MG/ML IJ SOLN
INTRAMUSCULAR | Status: DC | PRN
  Administered 2011-06-18: 50 ug via INTRAVENOUS
  Administered 2011-06-18: 25 ug via INTRAVENOUS

## 2011-06-18 MED ORDER — LACTATED RINGERS IV SOLN
INTRAVENOUS | Status: DC | PRN
  Administered 2011-06-18 (×2): via INTRAVENOUS

## 2011-06-18 MED ORDER — PROMETHAZINE HCL 25 MG/ML IJ SOLN: 6.2500 mg | INTRAMUSCULAR | Status: DC | PRN

## 2011-06-18 MED ORDER — BUPIVACAINE HCL (PF) 0.25 % IJ SOLN
INTRAMUSCULAR | Status: AC
  Filled 2011-06-18: qty 30

## 2011-06-18 MED ORDER — FENTANYL CITRATE 0.05 MG/ML IJ SOLN: 25.0000 ug | INTRAMUSCULAR | Status: DC | PRN

## 2011-06-18 MED ORDER — ONDANSETRON HCL 4 MG/2ML IJ SOLN
INTRAMUSCULAR | Status: DC | PRN
  Administered 2011-06-18: 4 mg via INTRAVENOUS

## 2011-06-18 SURGICAL SUPPLY — 37 items
ADH SKN CLS APL DERMABOND .7 (GAUZE/BANDAGES/DRESSINGS) ×1
BANDAGE ELASTIC 6 VELCRO ST LF (GAUZE/BANDAGES/DRESSINGS) ×2 IMPLANT
BINDER BREAST LRG (GAUZE/BANDAGES/DRESSINGS) ×1 IMPLANT
BLADE SURG 15 STRL LF DISP TIS (BLADE) ×3 IMPLANT
BLADE SURG 15 STRL SS (BLADE) ×6
CANISTER SUCTION 2500CC (MISCELLANEOUS) ×2 IMPLANT
CHLORAPREP W/TINT 26ML (MISCELLANEOUS) ×2 IMPLANT
CLEANER TIP ELECTROSURG 2X2 (MISCELLANEOUS) ×2 IMPLANT
CLOTH BEACON ORANGE TIMEOUT ST (SAFETY) ×2 IMPLANT
DECANTER SPIKE VIAL GLASS SM (MISCELLANEOUS) ×1 IMPLANT
DERMABOND ADVANCED (GAUZE/BANDAGES/DRESSINGS) ×1
DERMABOND ADVANCED .7 DNX12 (GAUZE/BANDAGES/DRESSINGS) IMPLANT
DRAPE LAPAROTOMY TRNSV 102X78 (DRAPE) ×2 IMPLANT
ELECT COATED BLADE 2.86 ST (ELECTRODE) ×2 IMPLANT
ELECT REM PT RETURN 9FT ADLT (ELECTROSURGICAL) ×2
ELECTRODE REM PT RTRN 9FT ADLT (ELECTROSURGICAL) ×1 IMPLANT
GAUZE SPONGE 4X4 16PLY XRAY LF (GAUZE/BANDAGES/DRESSINGS) ×2 IMPLANT
GLOVE BIOGEL PI IND STRL 7.0 (GLOVE) ×1 IMPLANT
GLOVE BIOGEL PI INDICATOR 7.0 (GLOVE) ×1
GLOVE EUDERMIC 7 POWDERFREE (GLOVE) ×2 IMPLANT
GOWN STRL NON-REIN LRG LVL3 (GOWN DISPOSABLE) ×2 IMPLANT
GOWN STRL REIN XL XLG (GOWN DISPOSABLE) ×4 IMPLANT
KIT BASIN OR (CUSTOM PROCEDURE TRAY) ×2 IMPLANT
NDL HYPO 25X1 1.5 SAFETY (NEEDLE) ×1 IMPLANT
NEEDLE HYPO 22GX1.5 SAFETY (NEEDLE) ×1 IMPLANT
NEEDLE HYPO 25X1 1.5 SAFETY (NEEDLE) ×2 IMPLANT
PACK BASIC VI WITH GOWN DISP (CUSTOM PROCEDURE TRAY) ×2 IMPLANT
PEN SKIN MARKING BROAD (MISCELLANEOUS) ×2 IMPLANT
PENCIL BUTTON HOLSTER BLD 10FT (ELECTRODE) ×2 IMPLANT
SOL PREP POV-IOD 16OZ 10% (MISCELLANEOUS) ×2 IMPLANT
SPONGE GAUZE 4X4 12PLY (GAUZE/BANDAGES/DRESSINGS) ×2 IMPLANT
SPONGE LAP 4X18 X RAY DECT (DISPOSABLE) ×2 IMPLANT
STRIP CLOSURE SKIN 1/4X3 (GAUZE/BANDAGES/DRESSINGS) ×4 IMPLANT
SYR BULB IRRIGATION 50ML (SYRINGE) ×2 IMPLANT
SYR CONTROL 10ML LL (SYRINGE) ×2 IMPLANT
TOWEL OR 17X26 10 PK STRL BLUE (TOWEL DISPOSABLE) ×2 IMPLANT
YANKAUER SUCT BULB TIP 10FT TU (MISCELLANEOUS) ×2 IMPLANT

## 2011-06-18 NOTE — Transfer of Care (Signed)
Immediate Anesthesia Transfer of Care Note  Patient: Caroline White  Procedure(s) Performed: Procedure(s) (LRB): EXCISION DUCTAL SYSTEM BREAST (Left)  Patient Location: PACU  Anesthesia Type: General  Level of Consciousness: awake and alert   Airway & Oxygen Therapy: Patient Spontanous Breathing and Patient connected to face mask oxygen  Post-op Assessment: Report given to PACU RN and Post -op Vital signs reviewed and stable  Post vital signs: Reviewed and stable  Complications: No apparent anesthesia complications

## 2011-06-18 NOTE — Progress Notes (Signed)
PIV discontinued per protocol.  Cath tip intact.

## 2011-06-18 NOTE — Op Note (Signed)
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    Description of Procedure: The patient is seen in the holding area and had no further questions. I marked the left breast as the operative side.  The patient was taken to the operating room and after satisfactory general anesthesia had been obtained the left breast was prepped and draped and a time out was done.I made several attempts to cannulate the duct which is producing fluid. However was unable to cannulate it with a tear duct probe or a small Angiocath. I therefore made a curvilinear incision at the medial areolar margin. I divided the subcutaneous tissue and elevated Uriel or tissue over and divided the ductal structures as they entered the nipple. Then I took a central ductal excision to be sure we had a good excision of the area in question. This was all done with cautery.  I put 20 cc of 0.25% plain Marcaine and to help with postop pain relief. I irrigated and made sure everything was dry. I closed in layers with 3-0 Vicryl followed by 4-0 Monocryl subcuticular and Dermabond.  The patient tolerated the procedure well. There were no operative complications. All counts were correct.  Currie Paris, MD, FACS 06/18/2011 12:20 PM

## 2011-06-18 NOTE — Anesthesia Preprocedure Evaluation (Addendum)
Anesthesia Evaluation  Patient identified by MRN, date of birth, ID band Patient awake    Reviewed: Allergy & Precautions, H&P , NPO status , Patient's Chart, lab work & pertinent test results  Airway       Dental   Pulmonary neg pulmonary ROS,          Cardiovascular hypertension, Pt. on medications     Neuro/Psych negative neurological ROS  negative psych ROS   GI/Hepatic negative GI ROS, Neg liver ROS,   Endo/Other  Diabetes mellitus-, Type 2  Renal/GU negative Renal ROS  negative genitourinary   Musculoskeletal negative musculoskeletal ROS (+)   Abdominal   Peds negative pediatric ROS (+)  Hematology negative hematology ROS (+)   Anesthesia Other Findings   Reproductive/Obstetrics negative OB ROS                           Anesthesia Physical Anesthesia Plan  ASA: III  Anesthesia Plan: General   Post-op Pain Management:    Induction: Intravenous  Airway Management Planned: LMA  Additional Equipment:   Intra-op Plan:   Post-operative Plan: Extubation in OR  Informed Consent: I have reviewed the patients History and Physical, chart, labs and discussed the procedure including the risks, benefits and alternatives for the proposed anesthesia with the patient or authorized representative who has indicated his/her understanding and acceptance.   Dental advisory given  Plan Discussed with: CRNA  Anesthesia Plan Comments:         Anesthesia Quick Evaluation

## 2011-06-18 NOTE — Anesthesia Postprocedure Evaluation (Signed)
  Anesthesia Post-op Note  Patient: Caroline White  Procedure(s) Performed: Procedure(s) (LRB): EXCISION DUCTAL SYSTEM BREAST (Left)  Patient Location: PACU  Anesthesia Type: General  Level of Consciousness: awake and alert   Airway and Oxygen Therapy: Patient Spontanous Breathing  Post-op Pain: mild  Post-op Assessment: Post-op Vital signs reviewed, Patient's Cardiovascular Status Stable, Respiratory Function Stable, Patent Airway and No signs of Nausea or vomiting  Post-op Vital Signs: stable  Complications: No apparent anesthesia complications

## 2011-06-18 NOTE — Discharge Instructions (Signed)
[facility] [phone]   BREAST BIOPSY/ PARTIAL MASTECTOMY: POST OP INSTRUCTIONS  Always review your discharge instruction sheet given to you by the facility where your surgery was performed.  IF YOU HAVE DISABILITY OR FAMILY LEAVE FORMS, YOU MUST BRING THEM TO THE OFFICE FOR PROCESSING.  DO NOT GIVE THEM TO YOUR DOCTOR.  1. A prescription for pain medication will be given to you upon discharge.  Take your pain medication as prescribed, as needed.  If narcotic pain medicine is not needed, then you may take ibuprofen (Advil) as needed. 2. Take your usually prescribed medications unless otherwise directed 3. If you need a refill on your pain medication, please contact your pharmacy.  They will contact our office to request authorization.  Prescriptions will not be filled after 5pm or on week-ends. 4. You should eat very light the first 24 hours after surgery, such as soup, crackers, pudding, etc.  Resume your normal diet the day after surgery. 5. Most patients will experience some swelling and bruising in the breast.  Ice packs and a good support bra will help.  Swelling and bruising can take several days to resolve.  6. It is common to experience some constipation if taking pain medication after surgery.  Increasing fluid intake and taking a stool softener will usually help or prevent this problem from occurring.  A mild laxative (Milk of Magnesia or Miralax) should be taken according to package directions if there are no bowel movements after 48 hours. 7. Unless discharge instructions indicate otherwise, you may remove your bandages 24 hours after surgery, and you may shower at that time.  If your surgeon used skin glue on the incision, you may shower in 24 hours.  The glue will flake off over the next 2-3 weeks. 8. DRAINS:  If you have drain, it is important to keep a list of the amount of drainage produced each day in your drains.  Before leaving the hospital, you should  be instructed on drain care.  Call our office if you have any questions about your drains. BE SURE TO BRING THE RECORD OF THE AMOUNT OF DRAINAGE TO YOUR OFFICE VISITS. We use this to determine when the drains can be removed. 9. ACTIVITIES:  You may resume regular daily activities (gradually increasing) beginning the next day.  Wearing a good support bra or sports bra minimizes pain and swelling.  You may have sexual intercourse when it is comfortable. a. You may drive when you no longer are taking prescription pain medication, you can comfortably wear a seatbelt, and you can safely maneuver your car and apply brakes. b. RETURN TO WORK:  ______________________________________________________________________________________ 10. You should see your doctor in the office for a follow-up appointment approximately two weeks after your surgery.  Your doctor's nurse will typically make your follow-up appointment when she calls you with your pathology report.  Expect your pathology report 2-3 business days after your surgery.  You may call to check if you do not hear from us after three days. 11. OTHER INSTRUCTIONS: _______________________________________________________________________________________________ _____________________________________________________________________________________________________________________________________ _____________________________________________________________________________________________________________________________________ _____________________________________________________________________________________________________________________________________  WHEN TO CALL YOUR DOCTOR: 1. Fever over 101.0 2. Nausea and/or vomiting. 3. Extreme swelling or bruising. 4. Continued bleeding from incision. 5. Increased pain, redness, or drainage from the incision.  The clinic staff is available to answer your questions during regular business hours.  Please don't  hesitate to call and ask to speak to one of the nurses for clinical concerns.  If you have a medical emergency, go   to the nearest emergency room or call 911.  A surgeon from [facility] is always on call at the hospital.  [address], [address], [place], [place]  [address] ?  [address] [address], [place], [place]   [address]                           [phone] ? [phone] ? FAX [phone] Web site: [email or web address]  

## 2011-06-18 NOTE — Progress Notes (Signed)
Pt up ad lib. Pt tolerated well and voided moderate amount of clear yellow urine.

## 2011-06-18 NOTE — Interval H&P Note (Signed)
History and Physical Interval Note:  06/18/2011 10:18 AM  Caroline White  has presented today for surgery, with the diagnosis of left nipple discharge  The various methods of treatment have been discussed with the patient and family. After consideration of risks, benefits and other options for treatment, the patient has consented to  Procedure(s) (LRB): EXCISION DUCTAL SYSTEM BREAST (Left) as a surgical intervention .  The patients' history has been reviewed, patient examined, no change in status, stable for surgery.  I have reviewed the patients' chart and labs.  Questions were answered to the patient's satisfaction.  I have marked the left breast as the operative site   [name]

## 2011-06-18 NOTE — H&P (View-Only) (Signed)
Patient ID: Caroline White, female   DOB: 10/15/1950, 61 y.o.   MRN: [id number]  Chief Complaint  Patient presents with  . Breast Discharge    new pt- eval L breast abn ductogram... bloody nipple discharge   Referred by: Daryl Eastern, MD PCP; Thora Lance, MD, MD  HPI Caroline White is a 61 y.o. female.  This patient has had a clear left nipple discharge for about six or seven months. Recently it has become bloody. It is spontaneous. She's had no other breast symptoms or history of any other breast problems. She has a maternal grandmother who had breast cancer but no other family history of breast or ovarian cancer. She has not noticed a lump or mass in her breast. She had a mammogram which was unremarkable except for a dilated duct on the left. A ductogram showed a filling defect on the left. She was referred for surgical evaluation. HPI  Past Medical History  Diagnosis Date  . Diabetes mellitus     type II  . Hypertension   . Hyperlipidemia   . Nipple discharge     bloody from Lt breast    Past Surgical History  Procedure Date  . Abdominal hysterectomy 1998    full  . Tonsillectomy and adenoidectomy 1957    Family History  Problem Relation Age of Onset  . Cancer Mother     uterine and stomach  . Stroke Father   . Cancer Maternal Grandmother     breast    Social History History  Substance Use Topics  . Smoking status: Never Smoker   . Smokeless tobacco: Not on file  . Alcohol Use: No    Allergies  Allergen Reactions  . Sulfa Drugs Cross Reactors     Current Outpatient Prescriptions  Medication Sig Dispense Refill  . losartan-hydrochlorothiazide (HYZAAR) 100-12.5 MG per tablet daily.      . Multiple Vitamin (MULTIVITAMIN) capsule Take 1 capsule by mouth daily.      . pravastatin (PRAVACHOL) 40 MG tablet daily.        Review of Systems Review of Systems  Constitutional: Negative for fever, chills and unexpected weight change.  HENT:  Negative for hearing loss, congestion, sore throat, trouble swallowing and voice change.   Eyes: Negative for visual disturbance.  Respiratory: Negative for cough and wheezing.   Cardiovascular: Negative for chest pain, palpitations and leg swelling.  Gastrointestinal: Negative for nausea, vomiting, abdominal pain, diarrhea, constipation, blood in stool, abdominal distention and anal bleeding.  Genitourinary: Negative for hematuria, vaginal bleeding and difficulty urinating.  Musculoskeletal: Negative for arthralgias.  Skin: Negative for rash and wound.       Psoriasis  Neurological: Negative for seizures, syncope and headaches.  Hematological: Negative for adenopathy. Does not bruise/bleed easily.  Psychiatric/Behavioral: Negative for confusion.    Blood pressure 146/82, pulse 60, temperature 98.4 F (36.9 C), temperature source Temporal, resp. rate 16, height 5\' 6"  (1.676 m), weight 181 lb 9.6 oz (82.373 kg).  Physical Exam Physical Exam  Vitals reviewed. Constitutional: She is oriented to person, place, and time. She appears well-developed and well-nourished. No distress.  HENT:  Head: Normocephalic and atraumatic.  Mouth/Throat: Oropharynx is clear and moist.       Needs dental work  Eyes: Conjunctivae and EOM are normal. Pupils are equal, round, and reactive to light. No scleral icterus.  Neck: Normal range of motion. Neck supple. No tracheal deviation present. No thyromegaly present.  Cardiovascular: Normal rate, regular rhythm, normal  heart sounds and intact distal pulses.  Exam reveals no gallop and no friction rub.   No murmur heard. Pulmonary/Chest: Effort normal and breath sounds normal. No respiratory distress. She has no wheezes. She has no rales.       Nipple discharge left, 9:00 position, clear today, readily expressed. Breasts negative to exam otherwise.Lymphatics negative  Abdominal: Soft. Bowel sounds are normal. She exhibits no distension and no mass. There is no  tenderness. There is no rebound and no guarding.  Musculoskeletal: Normal range of motion. She exhibits no edema and no tenderness.  Neurological: She is alert and oriented to person, place, and time.  Skin: Skin is warm and dry. No rash noted. She is not diaphoretic. No erythema.  Psychiatric: She has a normal mood and affect. Her behavior is normal. Judgment and thought content normal.    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 I have discussed the procedure, risks and complications with the patient as well as the alternative of f/u. She wants to go ahead and schedule       Caroline White 06/03/2011, 9:15 AM

## 2011-06-22 ENCOUNTER — Telehealth (INDEPENDENT_AMBULATORY_CARE_PROVIDER_SITE_OTHER): Payer: Self-pay | Admitting: General Surgery

## 2011-06-22 NOTE — Telephone Encounter (Signed)
Patient made aware of path results. Will follow up at appt and call with any questions prior.  

## 2011-06-22 NOTE — Telephone Encounter (Signed)
Called patient at work [phone] ext [phone] and left message for her to call me back.

## 2011-06-22 NOTE — Telephone Encounter (Signed)
Message copied by Liliana Cline on Mon Jun 22, 2011  8:12 AM ------      Message from: Currie Paris      Created: Mon Jun 22, 2011  6:00 AM       Tell her path is benign and as expected

## 2011-07-03 ENCOUNTER — Encounter (HOSPITAL_COMMUNITY): Payer: Self-pay | Admitting: Surgery

## 2011-07-10 ENCOUNTER — Encounter (INDEPENDENT_AMBULATORY_CARE_PROVIDER_SITE_OTHER): Payer: Self-pay | Admitting: Surgery

## 2011-07-10 ENCOUNTER — Ambulatory Visit (INDEPENDENT_AMBULATORY_CARE_PROVIDER_SITE_OTHER): Payer: BC Managed Care – PPO | Admitting: Surgery

## 2011-07-10 VITALS — BP 128/78 | HR 70 | Temp 97.4°F | Resp 16 | Ht 66.0 in | Wt 179.1 lb

## 2011-07-10 DIAGNOSIS — N6452 Nipple discharge: Secondary | ICD-10-CM

## 2011-07-10 DIAGNOSIS — N6459 Other signs and symptoms in breast: Secondary | ICD-10-CM

## 2011-07-10 NOTE — Progress Notes (Signed)
NAME: Caroline White                                            DOB: Nov 15, 1950 DATE: 07/10/2011                                                  MRN: [id number]  CC: Post op   HPI: This patient comes in for post op follow-up.Sheunderwent left ductal excision on 06/18/11. She feels that she is doing well.She has had no pain since surgery  PE: General: The patient appears to be healthy, NAD Wound clean and healing fine  DATA REVIEWED: Path report showed intraductal papilloma and fibrocystic changes  IMPRESSION: The patient is doing well S/P ductal excision.    PLAN: RTC PRN. Discussed the path report and gave her a copy

## 2011-07-10 NOTE — Patient Instructions (Signed)
We will see you again on an as needed basis. Please call the office at [phone] if you have any questions or concerns. Thank you for allowing us to take care of you.  

## 2013-05-17 ENCOUNTER — Ambulatory Visit (INDEPENDENT_AMBULATORY_CARE_PROVIDER_SITE_OTHER): Payer: BC Managed Care – PPO | Admitting: Cardiovascular Disease

## 2013-05-17 ENCOUNTER — Encounter: Payer: Self-pay | Admitting: Cardiovascular Disease

## 2013-05-17 VITALS — BP 132/78 | HR 75 | Ht 66.0 in | Wt 209.8 lb

## 2013-05-17 DIAGNOSIS — R0789 Other chest pain: Secondary | ICD-10-CM | POA: Insufficient documentation

## 2013-05-17 NOTE — Patient Instructions (Signed)
Your physician has requested that you have en exercise stress myoview.  Please follow instruction sheet, as given.  Your physician recommends that you schedule a follow-up appointment in: 1-2 months   Your physician recommends that you continue on your current medications as directed. Please refer to the Current Medication list given to you today.

## 2013-05-17 NOTE — Progress Notes (Signed)
Caroline White Date of Birth  Aug 30, 1950       [facility] [address], [place], [place]  [address]   [place], [place]  [address] [place]   Fax  [phone]     Fax [phone]  Problem List: 1. Hypertension 2. Diabetes mellitus 3. Exertional chest pain 4. Hyperlipidemia  History of Present Illness:  Caroline White is a 63 year old female who presents today for further evaluation of exertional chest pain. She has a history of diabetes and hypertension. Recently, she's decided to start an exercise program. She went out and walked in the field [place] house. After several minutes, she started having some chest tightness. The pain lasted until she stopped walking.   Was no associated shortness of breath or diaphoresis. The pain did not radiate.  She has not had the pain doing normal daily activities but does have exertional pain when she exercises.    Slight weight gain. No N,V,diarrhyea  Current Outpatient Prescriptions on File Prior to Visit  Medication Sig Dispense Refill  . Coal Tar Extract (PSORIASIN EX) Apply 1 application topically daily. For psoriasis  --over the counter      . losartan-hydrochlorothiazide (HYZAAR) 100-12.5 MG per tablet Take 1 tablet by mouth daily before breakfast.       . Multiple Vitamin (MULTIVITAMIN) capsule Take 1 capsule by mouth daily.      . ONE TOUCH ULTRA TEST test strip 2 (two) times a week.      . pravastatin (PRAVACHOL) 40 MG tablet Take 40 mg by mouth daily before breakfast.        No current facility-administered medications on file prior to visit.    Allergies  Allergen Reactions  . Fish Oil     Heart racing  . Sulfa Drugs Cross Reactors Other (See Comments)    Unknown   . Zestril [Lisinopril] Cough    Past Medical History  Diagnosis Date  . Hypertension   . Hyperlipidemia   . Nipple discharge     bloody from Lt breast  . Diabetes  mellitus     type II--no medications  diet control  . Psoriasis   . History of kidney stones     "Passed"  . 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 Path confirmed intraductal papilloma     Past Surgical History  Procedure Laterality Date  . Abdominal hysterectomy  1998    full  . Tonsillectomy and adenoidectomy  1958  . Nasal surgery 1965    . Tubal ligation    . Breast ductal system excision  06/18/2011    Procedure: EXCISION DUCTAL SYSTEM BREAST;  Surgeon: Haywood Lasso, MD;  Location: [facility];  Service: General;  Laterality: Left;  Left Breast Ductal Excision    History  Smoking status  . Never Smoker   Smokeless tobacco  . Never Used    History  Alcohol Use No    Family History  Problem Relation Age of Onset  . Cancer Mother     uterine and stomach  . Stroke Father   . Cancer Maternal Grandmother     breast    Reviw of Systems:  Reviewed in the HPI.  All other systems are negative.  Physical Exam: Blood pressure 132/78, pulse 75, height 5\' 6"  (1.676 m), weight 209 lb 12.8 oz (95.165 kg). Wt Readings from Last 3 Encounters:  05/17/13 209 lb 12.8 oz (95.165 kg)  07/10/11 179 lb 2 oz (81.251 kg)  06/16/11 178 lb (80.74 kg)     General: Well developed, well nourished, in no acute distress.  Head: Normocephalic, atraumatic, sclera non-icteric, mucus membranes are moist,   Neck: Supple. Carotids are 2 + without bruits. No JVD   Lungs: Clear   Heart: RR, normal S1, S2  Abdomen: Soft, non-tender, non-distended with normal bowel sounds.  Msk:  Strength and tone are normal   Extremities: No clubbing or cyanosis. No edema.  Distal pedal pulses are 2+ and equal    Neuro: CN II - XII intact.  Alert and oriented X 3.   Psych:  Normal   ECG: 05/17/2013: Normal sinus rhythm at 75. She has T wave inversions in lead aVL. Otherwise the EKG is normal.  Assessment / Plan:

## 2013-05-17 NOTE — Assessment & Plan Note (Signed)
Caroline White presents today for further evaluation of some exertional chest pain. She has a long history of hypertension and diabetes mellitus. She recently decided to start an exercise program. To her first time all to exercise, she started having chest tightness with exertion. This resolved when she stopped to rest.  Her EKG shows some nonspecific changes.  Given her risk factors of diabetes and hypertension and her slightly abnormal EKG I would favor doing a stress Myoview study. I'll see her back in the office in several months for followup visit.

## 2013-05-30 ENCOUNTER — Ambulatory Visit (HOSPITAL_COMMUNITY): Payer: BC Managed Care – PPO | Attending: Cardiology | Admitting: Radiology

## 2013-05-30 VITALS — BP 151/75 | HR 62 | Ht 66.0 in | Wt 208.0 lb

## 2013-05-30 DIAGNOSIS — R079 Chest pain, unspecified: Secondary | ICD-10-CM

## 2013-05-30 DIAGNOSIS — R0789 Other chest pain: Secondary | ICD-10-CM

## 2013-05-30 MED ORDER — TECHNETIUM TC 99M SESTAMIBI GENERIC - CARDIOLITE
33.0000 | Freq: Once | INTRAVENOUS | Status: AC | PRN
  Administered 2013-05-30: 33 via INTRAVENOUS

## 2013-05-30 MED ORDER — TECHNETIUM TC 99M SESTAMIBI GENERIC - CARDIOLITE
10.8000 | Freq: Once | INTRAVENOUS | Status: AC | PRN
  Administered 2013-05-30: 11 via INTRAVENOUS

## 2013-05-30 NOTE — Progress Notes (Signed)
  [facility] [address] [place], [place] [address] [phone]    Cardiology Nuclear Med Study  MAKINLEE AWWAD is a 63 y.o. female     MRN : [id number]     DOB: 1950/10/11  Procedure Date: 05/30/2013  Nuclear Med Background Indication for Stress Test:  Evaluation for Ischemia History:  No known CAD Cardiac Risk Factors: Family History - CAD, Hypertension, Lipids and NIDDM  Symptoms:  Chest Pain with Exertion   Nuclear Pre-Procedure Caffeine/Decaff Intake:  None NPO After: 9:00pm   Lungs:  clear O2 Sat: 98% on room air. IV 0.9% NS with Angio Cath:  22g  IV Site: R Hand  IV Started by:  Matilde Haymaker, RN  Chest Size (in):  42 Cup Size: B  Height: 5\' 6"  (1.676 m)  Weight:  208 lb (94.348 kg)  BMI:  Body mass index is 33.59 kg/(m^2). Tech Comments:  n/a    Nuclear Med Study 1 or 2 day study: 1 day  Stress Test Type:  Stress  Reading MD: n/a  Order Authorizing Provider:  Natale Lay  Resting Radionuclide: Technetium 16m Sestamibi  Resting Radionuclide Dose: 11.0 mCi   Stress Radionuclide:  Technetium 60m Sestamibi  Stress Radionuclide Dose: 33.0 mCi           Stress Protocol Rest HR: 62 Stress HR: 150  Rest BP: 151/75 Stress BP: 207/97  Exercise Time (min): 4:00 METS: 4.4           Dose of Adenosine (mg):  n/a Dose of Lexiscan: n/a mg  Dose of Atropine (mg): n/a Dose of Dobutamine: n/a mcg/kg/min (at max HR)  Stress Test Technologist: Glade Lloyd, BS-ES  Nuclear Technologist:  Annye Rusk, CNMT     Rest Procedure:  Myocardial perfusion imaging was performed at rest 45 minutes following the intravenous administration of Technetium 95m Sestamibi. Rest ECG: NSR - Normal EKG  Stress Procedure:  The patient exercised on the treadmill utilizing the Bruce Protocol for 4:00 minutes. The patient stopped due to fatigue and denied any chest pain.  Technetium 67m Sestamibi was injected at peak exercise and myocardial perfusion  imaging was performed after a brief delay. Stress ECG: Insignificant upsloping ST segment depression.  QPS Raw Data Images:  Acquisition technically good; normal left ventricular size. Stress Images:  Normal homogeneous uptake in all areas of the myocardium. Rest Images:  Normal homogeneous uptake in all areas of the myocardium. Subtraction (SDS):  No evidence of ischemia. Transient Ischemic Dilatation (Normal <1.22):  0.89 Lung/Heart Ratio (Normal <0.45):  0.30  Quantitative Gated Spect Images QGS EDV:  71 ml QGS ESV:  23 ml  Impression Exercise Capacity:  Poor exercise capacity. BP Response:  Hypertensive blood pressure response. Clinical Symptoms:  No chest pain or dyspnea. ECG Impression:  Insignificant upsloping ST segment depression. Comparison with Prior Nuclear Study: No previous nuclear study performed  Overall Impression:  Normal stress nuclear study.  LV Ejection Fraction: 68%.  LV Wall Motion:  NL LV Function; NL Wall Motion  Kirk Ruths

## 2013-06-28 ENCOUNTER — Ambulatory Visit (INDEPENDENT_AMBULATORY_CARE_PROVIDER_SITE_OTHER): Payer: BC Managed Care – PPO | Admitting: Cardiovascular Disease

## 2013-06-28 ENCOUNTER — Encounter: Payer: Self-pay | Admitting: Cardiovascular Disease

## 2013-06-28 VITALS — BP 134/80 | HR 73 | Ht 66.0 in | Wt 211.8 lb

## 2013-06-28 DIAGNOSIS — R0789 Other chest pain: Secondary | ICD-10-CM

## 2013-06-28 NOTE — Patient Instructions (Signed)
Your physician recommends that you continue on your current medications as directed. Please refer to the Current Medication list given to you today.  Your physician recommends that you schedule a follow-up appointment with Dr. Acie Fredrickson as needed.

## 2013-06-28 NOTE — Progress Notes (Signed)
Caroline White Date of Birth  1950-08-29       [facility] [address], [place], [place]  [address]   [place], [place]  [address] [phone]    [phone]   Fax  [phone]     Fax [phone]  Primary MD - Coral Ceo, MD  Problem List: 1. Hypertension 2. Diabetes mellitus 3. Exertional chest pain 4. Hyperlipidemia  History of Present Illness:  Tailor is a 63 year old female who presents today for further evaluation of exertional chest pain. She has a history of diabetes and hypertension. Recently, she's decided to start an exercise program. She went out and walked in the field her house. After several minutes, she started having some chest tightness. The pain lasted until she stopped walking.   Was no associated shortness of breath or diaphoresis. The pain did not radiate.  She has not had the pain doing normal daily activities but does have exertional pain when she exercises.    Slight weight gain. No N,V,diarrhyea  June 28, 2013:  Floris presented with chest discomfort in early March, 2015. She had a stress Myoview study.  The Myoview showed no evidence of ischemia. Her ejection fraction was normal at 60%.  She has been exercising some without any episodes of chest pain  Current Outpatient Prescriptions on File Prior to Visit  Medication Sig Dispense Refill  . Coal Tar Extract (PSORIASIN EX) Apply 1 application topically daily. For psoriasis  --over the counter      . losartan-hydrochlorothiazide (HYZAAR) 100-12.5 MG per tablet Take 1 tablet by mouth daily before breakfast.       . Multiple Vitamin (MULTIVITAMIN) capsule Take 1 capsule by mouth daily.      . ONE TOUCH ULTRA TEST test strip 2 (two) times a week.      . pravastatin (PRAVACHOL) 40 MG tablet Take 40 mg by mouth daily before breakfast.        No current facility-administered medications on file prior to visit.     Allergies  Allergen Reactions  . Fish Oil     Heart racing  . Sulfa Drugs Cross Reactors Other (See Comments)    Unknown   . Zestril [Lisinopril] Cough    Past Medical History  Diagnosis Date  . Hypertension   . Hyperlipidemia   . Nipple discharge     bloody from Lt breast  . Diabetes mellitus     type II--no medications  diet control  . Psoriasis   . History of kidney stones     "Passed"  . 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 Path confirmed intraductal papilloma     Past Surgical History  Procedure Laterality Date  . Abdominal hysterectomy  1998    full  . Tonsillectomy and adenoidectomy  1958  . Nasal surgery 1965    . Tubal ligation    . Breast ductal system excision  06/18/2011    Procedure: EXCISION DUCTAL SYSTEM BREAST;  Surgeon: Haywood Lasso, MD;  Location: [facility];  Service: General;  Laterality: Left;  Left Breast Ductal Excision    History  Smoking status  . Never Smoker   Smokeless tobacco  . Never Used    History  Alcohol Use No    Family History  Problem Relation Age of Onset  . Cancer Mother     uterine and stomach  . Stroke Father   .  Cancer Maternal Grandmother     breast    Reviw of Systems:  Reviewed in the HPI.  All other systems are negative.  Physical Exam: Blood pressure 134/80, pulse 73, height 5\' 6"  (1.676 m), weight 211 lb 12.8 oz (96.072 kg). Wt Readings from Last 3 Encounters:  06/28/13 211 lb 12.8 oz (96.072 kg)  05/30/13 208 lb (94.348 kg)  05/17/13 209 lb 12.8 oz (95.165 kg)     General: Well developed, well nourished, in no acute distress. Head: Normocephalic, atraumatic, sclera non-icteric, mucus membranes are moist,  Neck: Supple. Carotids are 2 + without bruits. No JVD  Lungs: Clear  Heart: RR, normal S1, S2 Abdomen: Soft, non-tender, non-distended with normal bowel sounds. Msk:  Strength and tone are normal  Extremities: No clubbing or cyanosis. No edema.  Distal pedal  pulses are 2+ and equal  Neuro: CN II - XII intact.  Alert and oriented X 3.  Psych:  Normal   ECG:  Assessment / Plan:

## 2013-06-28 NOTE — Assessment & Plan Note (Signed)
She's not having any further episodes of chest tightness. Stress Myoview study was normal. There is no evidence of ischemia. She has normal left ventricular systolic function. I'll see her back on an as-needed basis. She'll continue to followup with Dr. Marisue Humble

## 2013-07-04 ENCOUNTER — Other Ambulatory Visit: Payer: Self-pay

## 2013-07-04 DIAGNOSIS — Z1231 Encounter for screening mammogram for malignant neoplasm of breast: Secondary | ICD-10-CM

## 2013-08-03 ENCOUNTER — Other Ambulatory Visit: Payer: Self-pay | Admitting: Family Medicine

## 2013-08-08 ENCOUNTER — Encounter (INDEPENDENT_AMBULATORY_CARE_PROVIDER_SITE_OTHER): Payer: Self-pay

## 2013-08-08 ENCOUNTER — Ambulatory Visit
Admission: RE | Admit: 2013-08-08 | Discharge: 2013-08-08 | Disposition: A | Discharge status: Home / Self Care | Payer: BC Managed Care – PPO | Source: Ambulatory Visit

## 2013-08-08 DIAGNOSIS — Z1231 Encounter for screening mammogram for malignant neoplasm of breast: Secondary | ICD-10-CM

## 2015-11-13 ENCOUNTER — Other Ambulatory Visit: Payer: Self-pay | Admitting: Family Medicine

## 2015-11-13 ENCOUNTER — Ambulatory Visit
Admission: RE | Admit: 2015-11-13 | Discharge: 2015-11-13 | Disposition: A | Discharge status: Home / Self Care | Payer: BLUE CROSS/BLUE SHIELD | Source: Ambulatory Visit | Attending: Family Medicine | Admitting: Family Medicine

## 2015-11-13 DIAGNOSIS — R52 Pain, unspecified: Secondary | ICD-10-CM

## 2015-11-13 IMAGING — CR DG FOOT COMPLETE 3+V*L*
3 series · 3 of 3 positions shown · non-contrast
Comparison: None.

CLINICAL DATA: Pain in redness to the left lateral foot.

EXAM:
LEFT FOOT - COMPLETE 3+ VIEW

[t foot ap left]
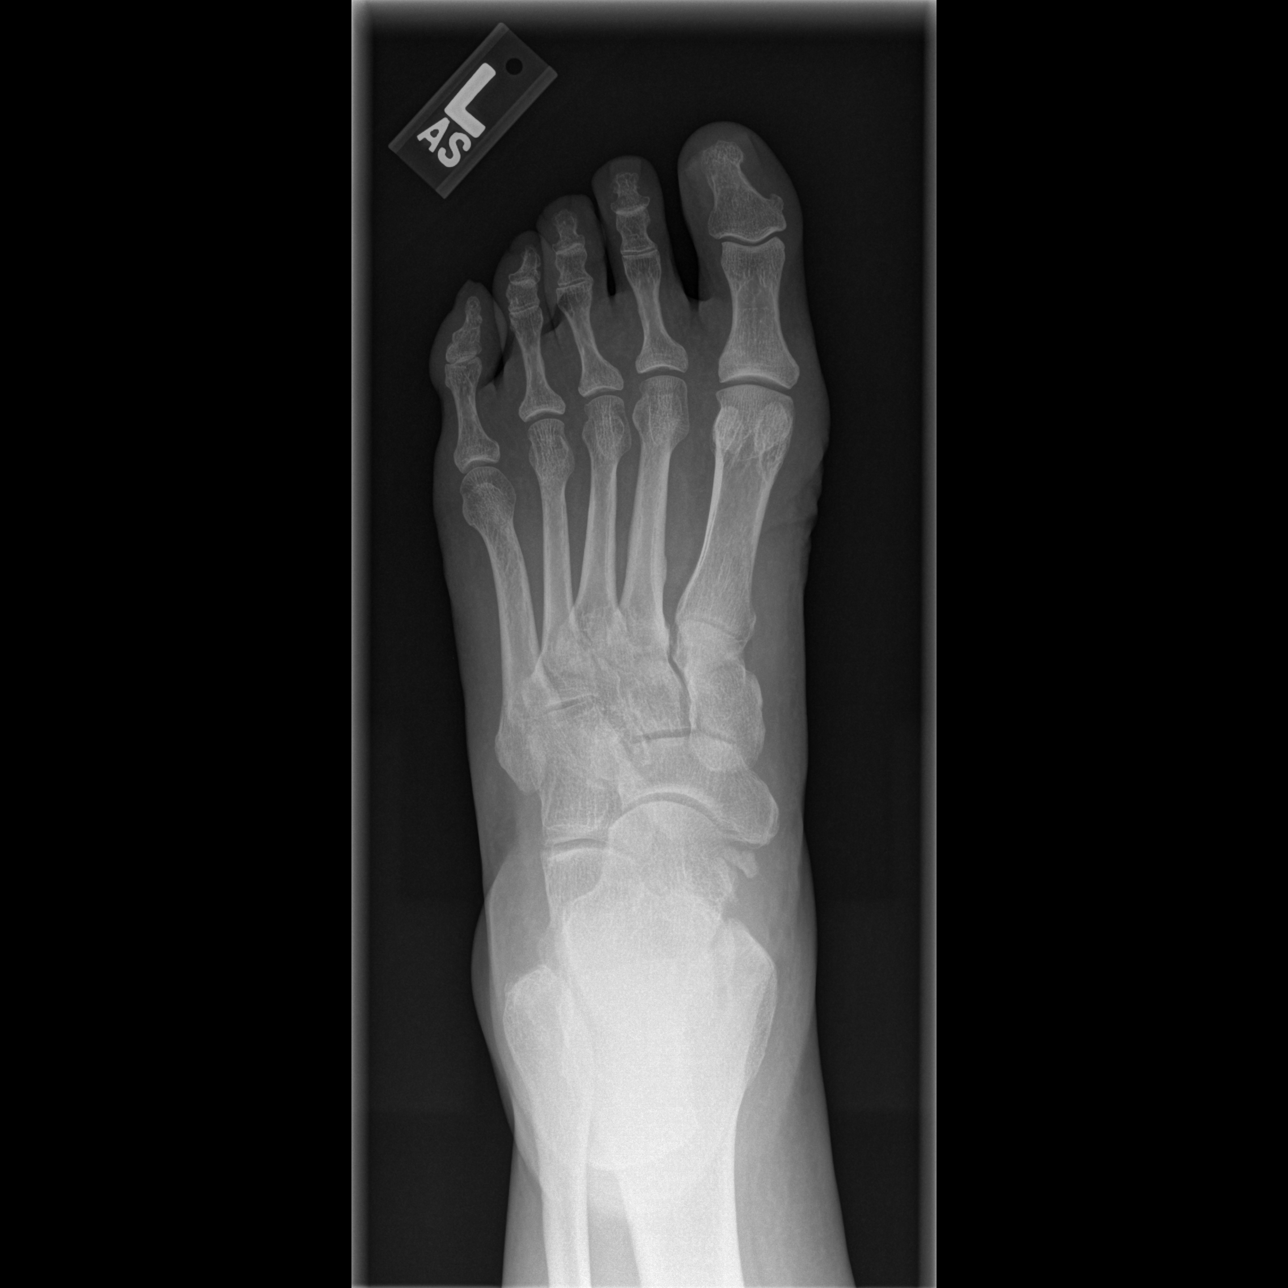

[t foot oblique left]
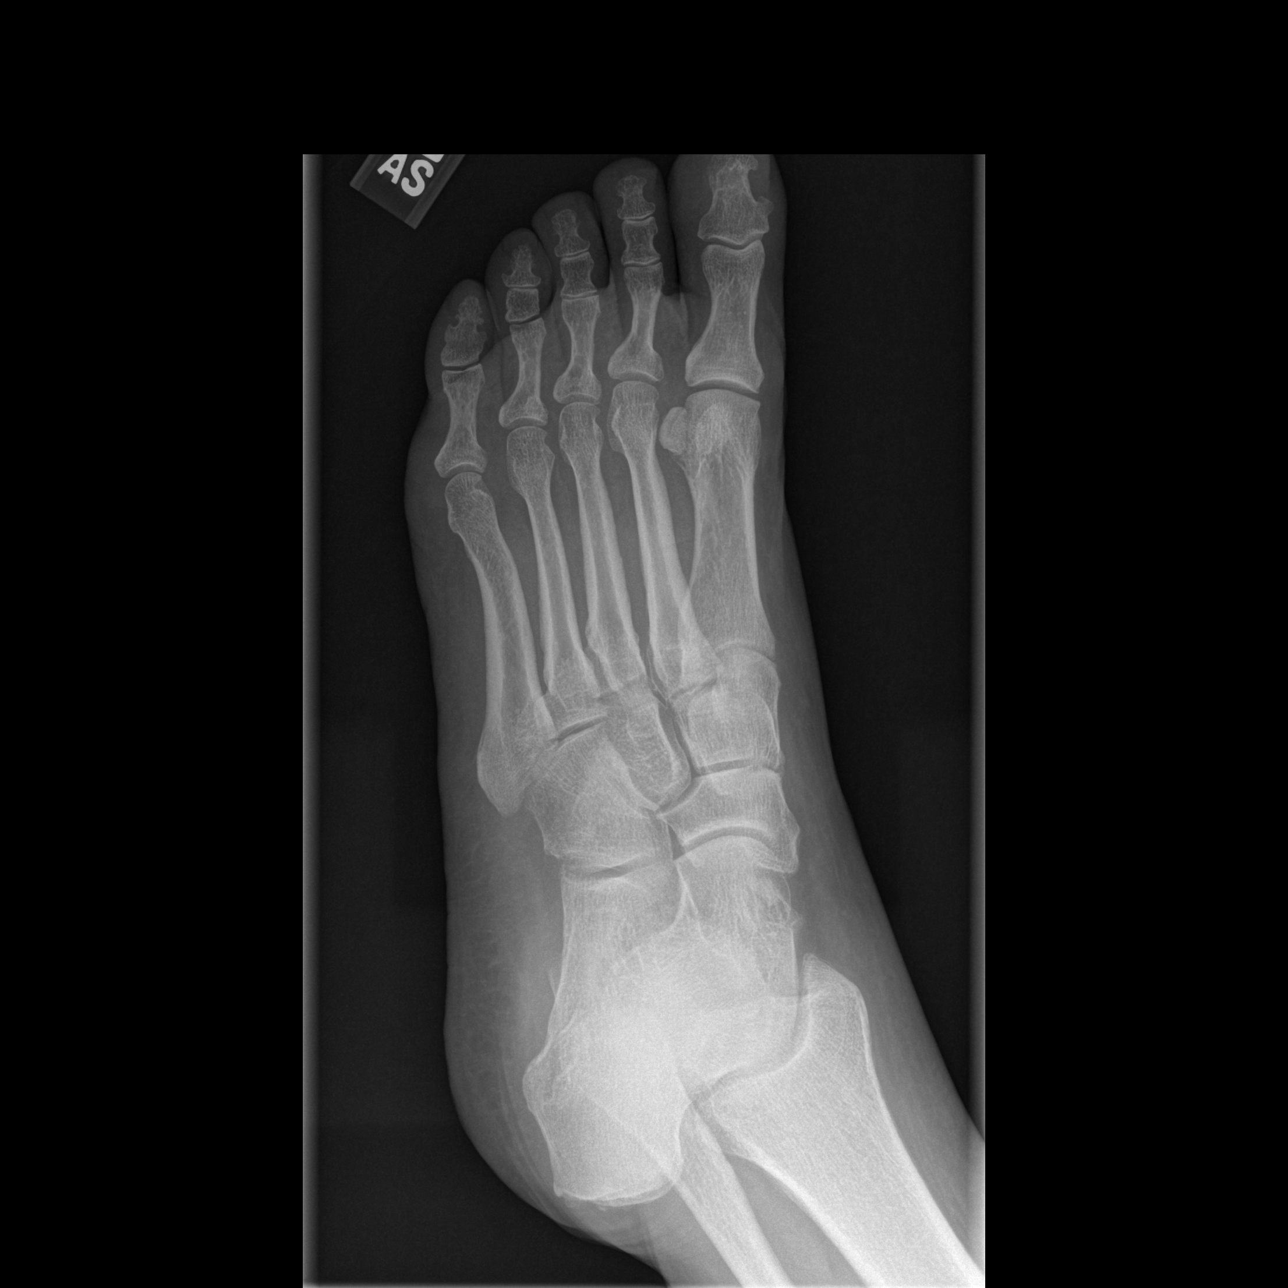

[t foot lat left]
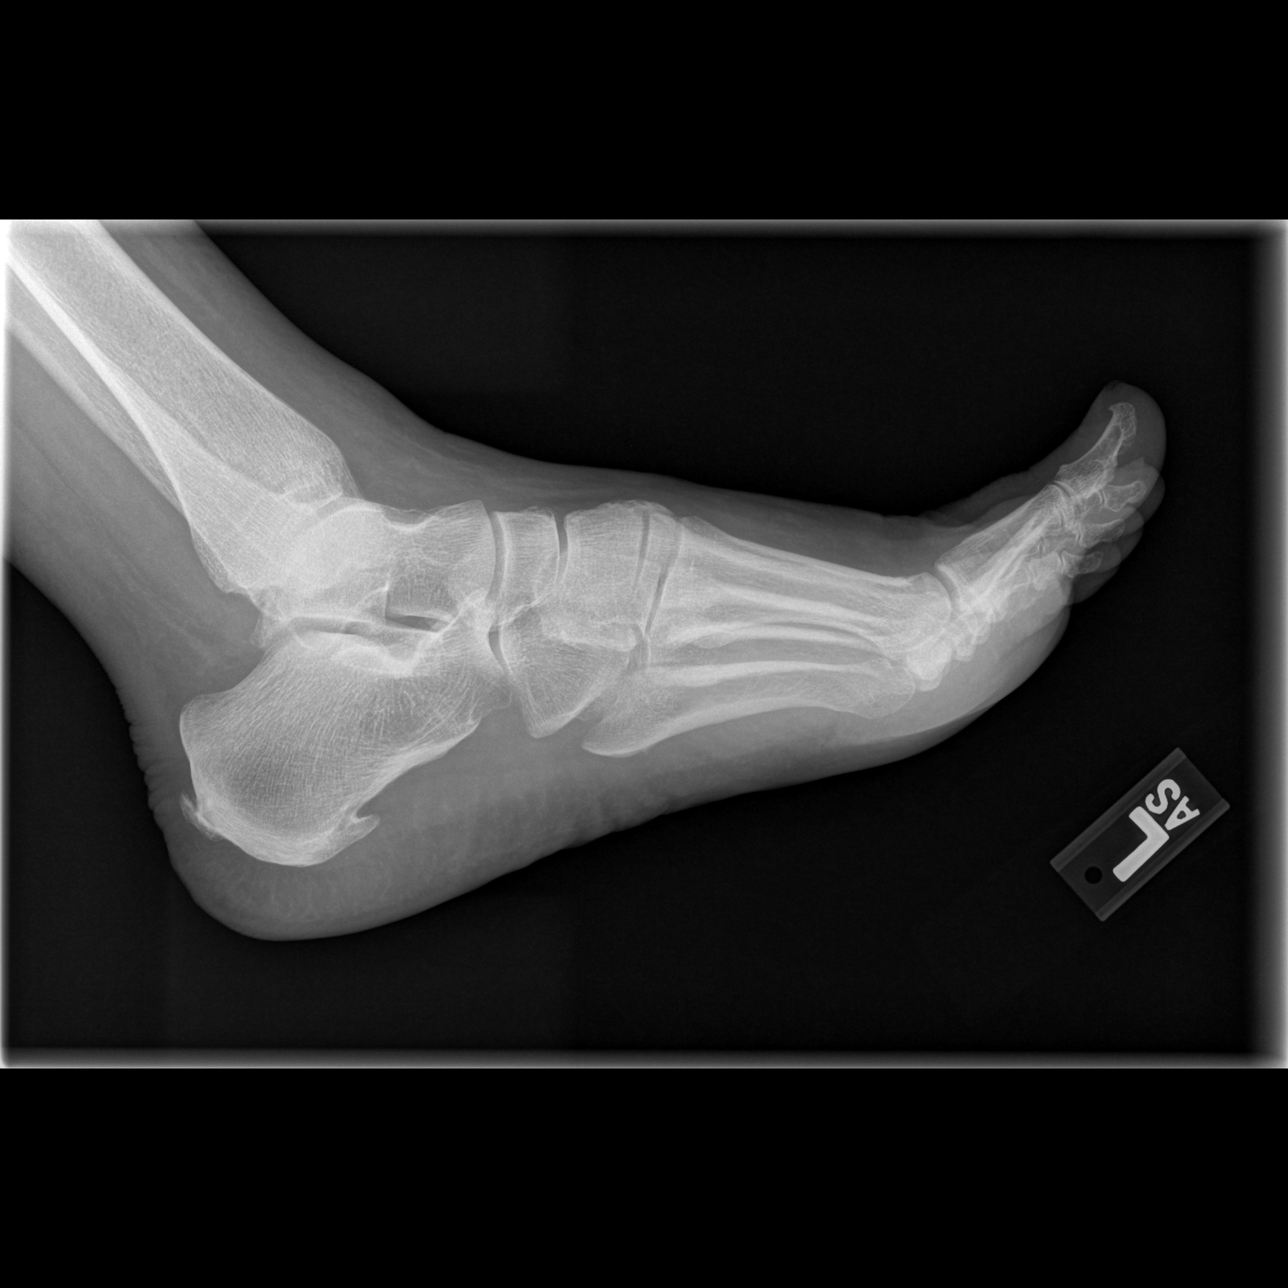

[3 of 3 positions shown; findings below may reference images not displayed]

FINDINGS: There is no evidence of fracture or dislocation. There is no
evidence of arthropathy or other focal bone abnormality. Soft
tissues are unremarkable.
IMPRESSION: No acute osseous injury of the left foot.

## 2015-12-05 ENCOUNTER — Other Ambulatory Visit: Payer: Self-pay | Admitting: Family Medicine

## 2015-12-05 DIAGNOSIS — Z1231 Encounter for screening mammogram for malignant neoplasm of breast: Secondary | ICD-10-CM

## 2015-12-17 ENCOUNTER — Ambulatory Visit: Payer: BLUE CROSS/BLUE SHIELD

## 2015-12-18 ENCOUNTER — Ambulatory Visit
Admission: RE | Admit: 2015-12-18 | Discharge: 2015-12-18 | Disposition: A | Discharge status: Home / Self Care | Payer: BLUE CROSS/BLUE SHIELD | Source: Ambulatory Visit | Attending: Family Medicine | Admitting: Family Medicine

## 2015-12-18 DIAGNOSIS — Z1231 Encounter for screening mammogram for malignant neoplasm of breast: Secondary | ICD-10-CM

## 2016-06-03 ENCOUNTER — Other Ambulatory Visit (HOSPITAL_COMMUNITY): Payer: Self-pay | Admitting: Interventional Radiology

## 2016-12-28 ENCOUNTER — Other Ambulatory Visit: Payer: Self-pay | Admitting: Family Medicine

## 2016-12-28 DIAGNOSIS — Z1231 Encounter for screening mammogram for malignant neoplasm of breast: Secondary | ICD-10-CM

## 2017-01-13 ENCOUNTER — Ambulatory Visit
Admission: RE | Admit: 2017-01-13 | Discharge: 2017-01-13 | Disposition: A | Discharge status: Home / Self Care | Payer: Medicare Other | Source: Ambulatory Visit | Attending: Family Medicine | Admitting: Family Medicine

## 2017-01-13 DIAGNOSIS — Z1231 Encounter for screening mammogram for malignant neoplasm of breast: Secondary | ICD-10-CM

## 2017-12-08 ENCOUNTER — Other Ambulatory Visit: Payer: Self-pay | Admitting: Family Medicine

## 2017-12-08 DIAGNOSIS — Z1231 Encounter for screening mammogram for malignant neoplasm of breast: Secondary | ICD-10-CM

## 2018-01-14 ENCOUNTER — Ambulatory Visit
Admission: RE | Admit: 2018-01-14 | Discharge: 2018-01-14 | Disposition: A | Discharge status: Home / Self Care | Payer: Self-pay | Source: Ambulatory Visit | Attending: Family Medicine | Admitting: Family Medicine

## 2018-01-14 DIAGNOSIS — Z1231 Encounter for screening mammogram for malignant neoplasm of breast: Secondary | ICD-10-CM

## 2018-01-14 IMAGING — MG DIGITAL SCREENING BILATERAL MAMMOGRAM WITH CAD
5 series · 5 of 5 positions shown · non-contrast
Comparison: Previous exam(s).

CLINICAL DATA: Screening.

EXAM:
DIGITAL SCREENING BILATERAL MAMMOGRAM WITH CAD

[L MLO (1 of 2)]
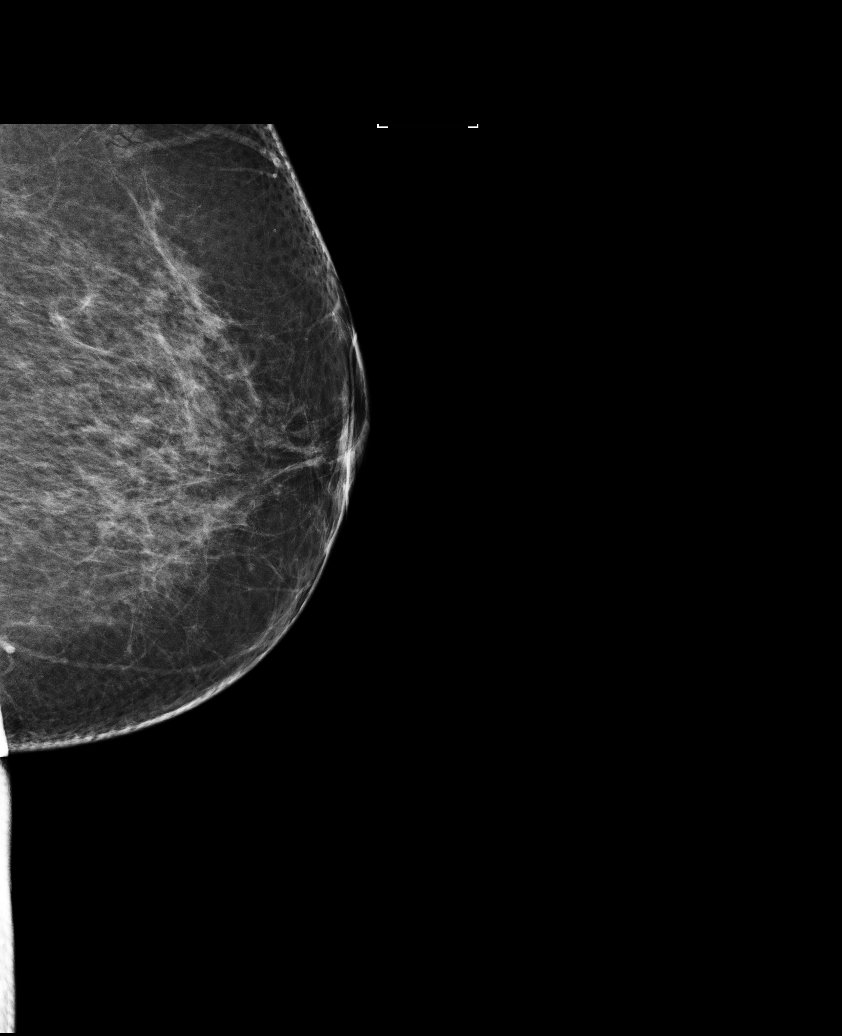

[L MLO (2 of 2)]
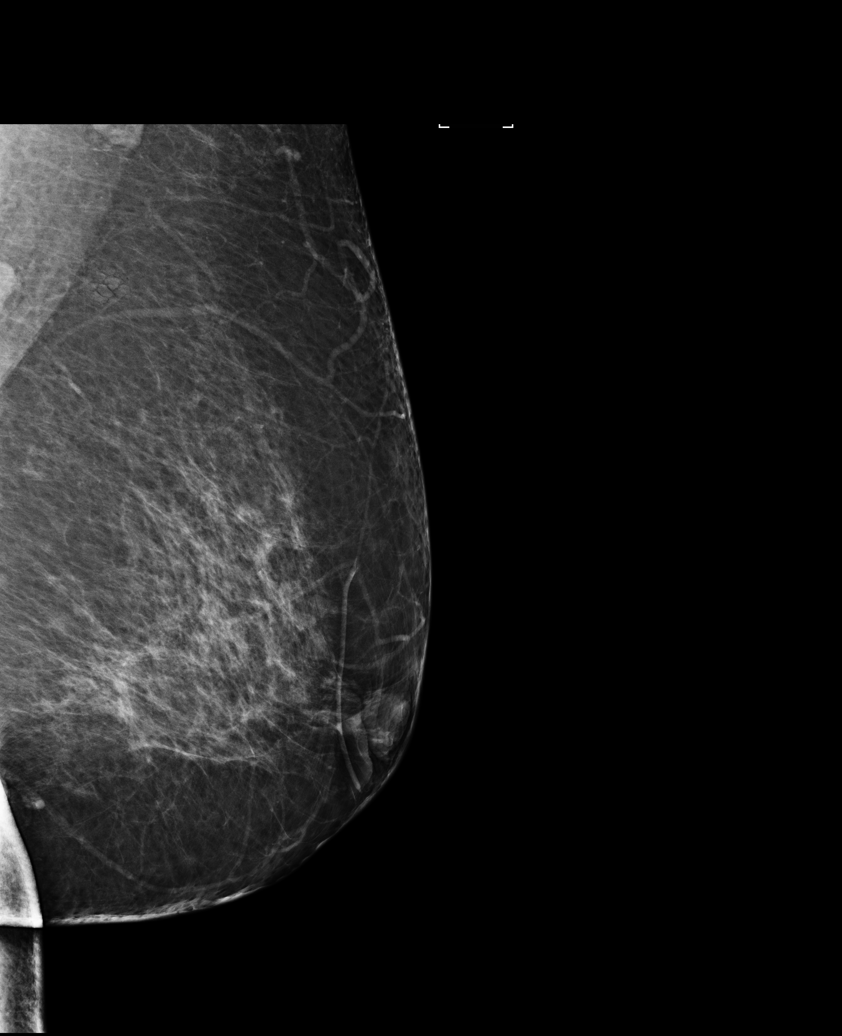

[R MLO]
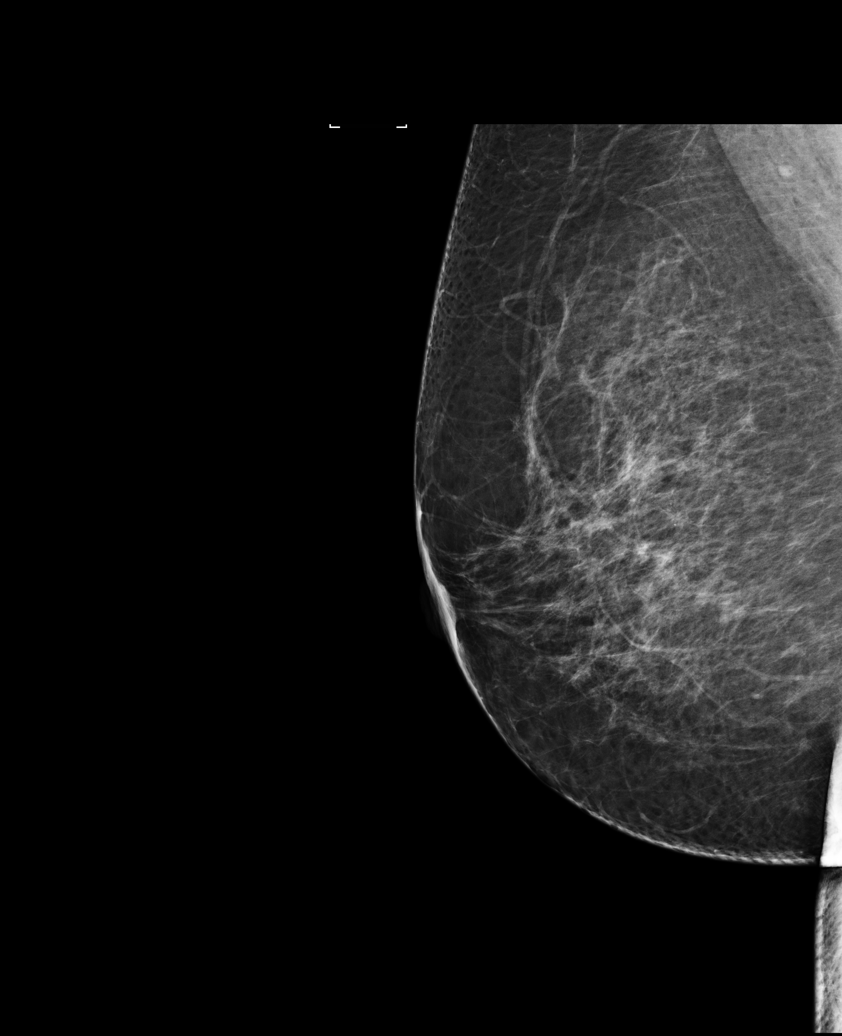

[R CC]
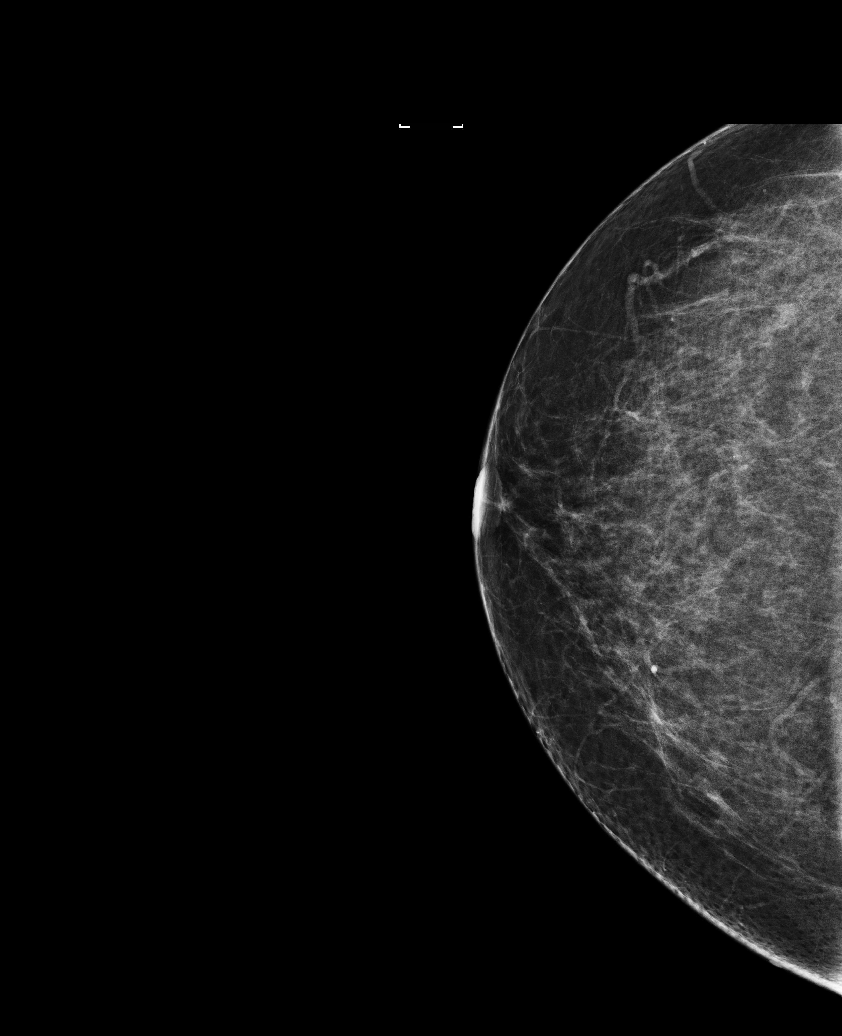

[L CC]
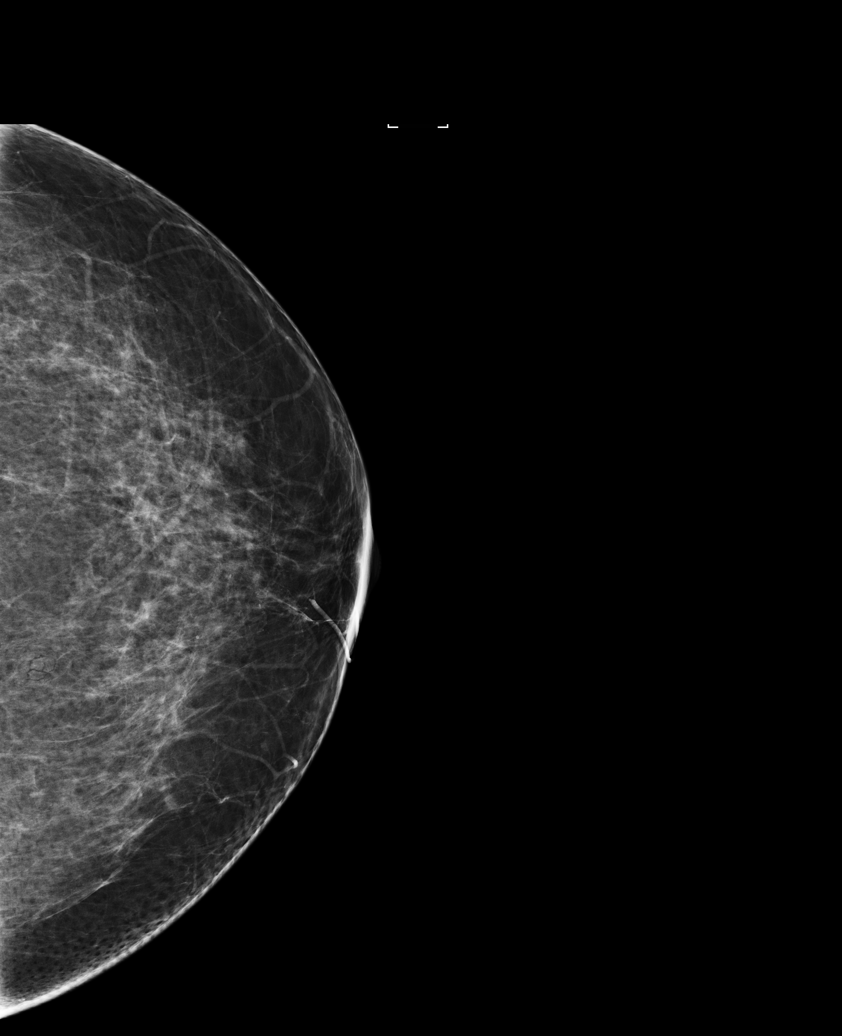

[5 of 5 positions shown; findings below may reference images not displayed]

ACR Breast Density Category b: There are scattered areas of
fibroglandular density.
FINDINGS: There are no findings suspicious for malignancy. Images were
processed with CAD.
IMPRESSION: No mammographic evidence of malignancy. A result letter of this
screening mammogram will be mailed directly to the patient.

RECOMMENDATION:
Screening mammogram in one year. (Code:[US])

BI-RADS CATEGORY  1: Negative.

## 2019-02-22 ENCOUNTER — Other Ambulatory Visit: Payer: Self-pay | Admitting: Family Medicine

## 2019-02-22 DIAGNOSIS — Z1231 Encounter for screening mammogram for malignant neoplasm of breast: Secondary | ICD-10-CM

## 2019-03-01 ENCOUNTER — Ambulatory Visit: Payer: Medicare Other

## 2019-04-20 ENCOUNTER — Ambulatory Visit
Admission: RE | Admit: 2019-04-20 | Discharge: 2019-04-20 | Disposition: A | Discharge status: Home / Self Care | Payer: Medicare Other | Source: Ambulatory Visit | Attending: Family Medicine | Admitting: Family Medicine

## 2019-04-20 ENCOUNTER — Other Ambulatory Visit: Payer: Self-pay

## 2019-04-20 DIAGNOSIS — Z1231 Encounter for screening mammogram for malignant neoplasm of breast: Secondary | ICD-10-CM

## 2019-04-20 IMAGING — MG DIGITAL SCREENING BILAT W/ CAD
4 series · 4 of 4 positions shown · non-contrast
Comparison: Previous exam(s).

CLINICAL DATA: Screening.

EXAM:
DIGITAL SCREENING BILATERAL MAMMOGRAM WITH CAD

[R MLO]
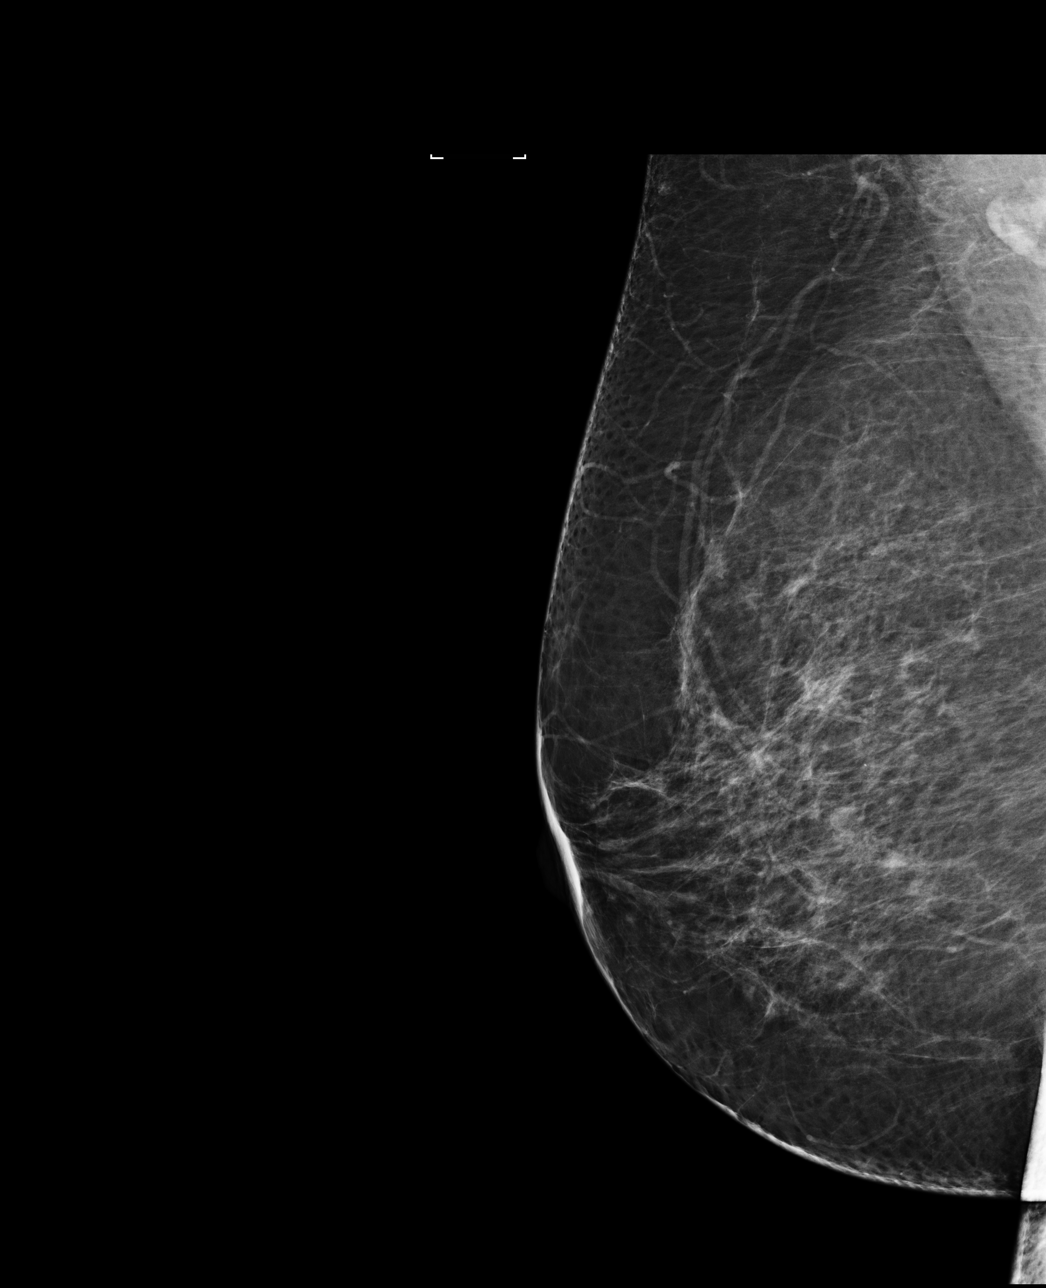

[R CC]
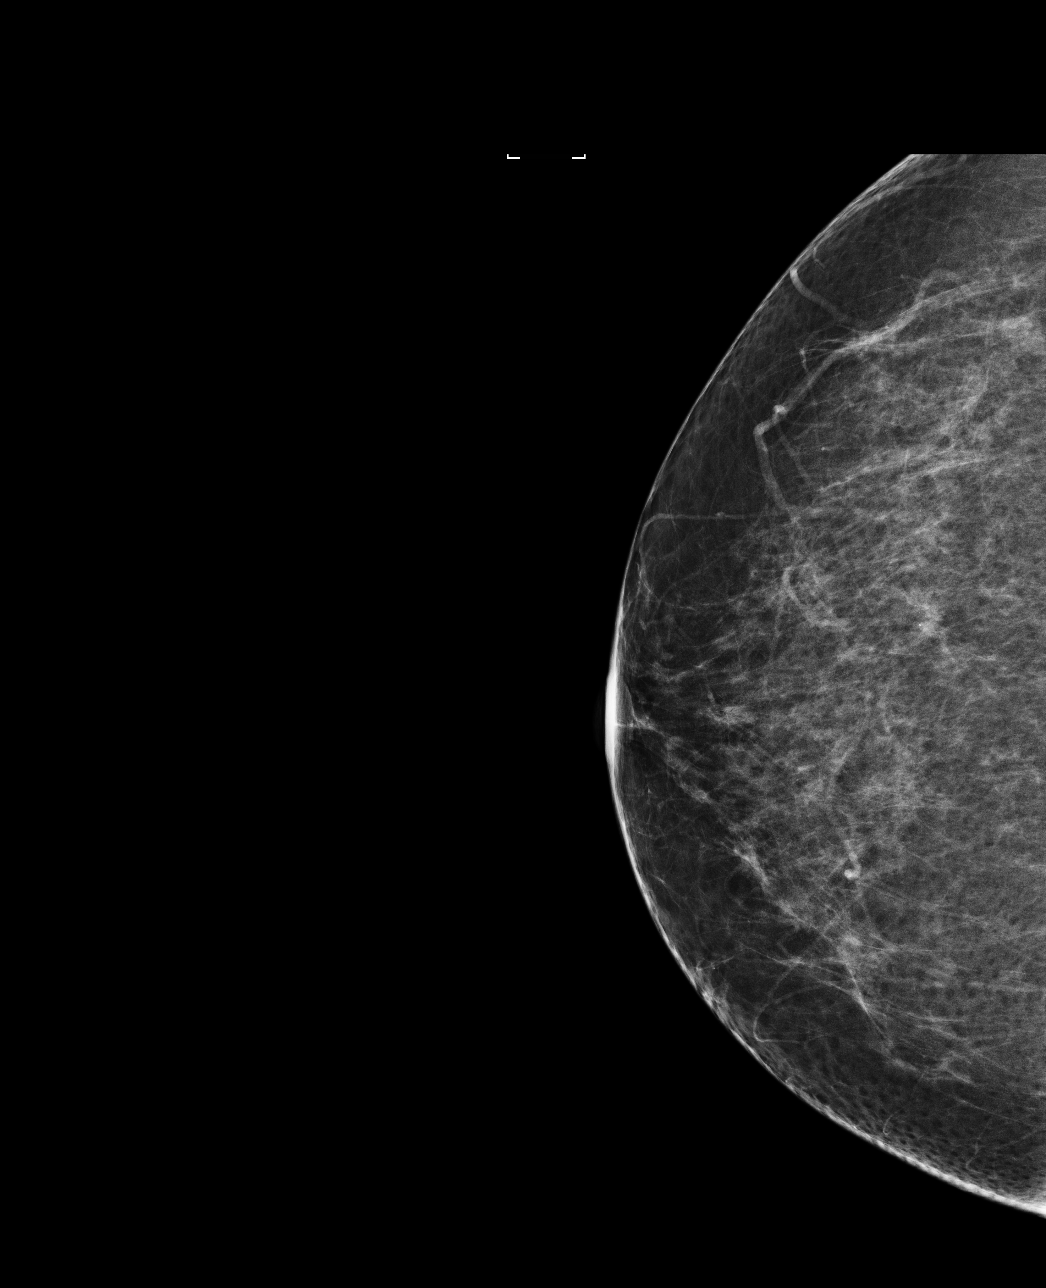

[L CC]
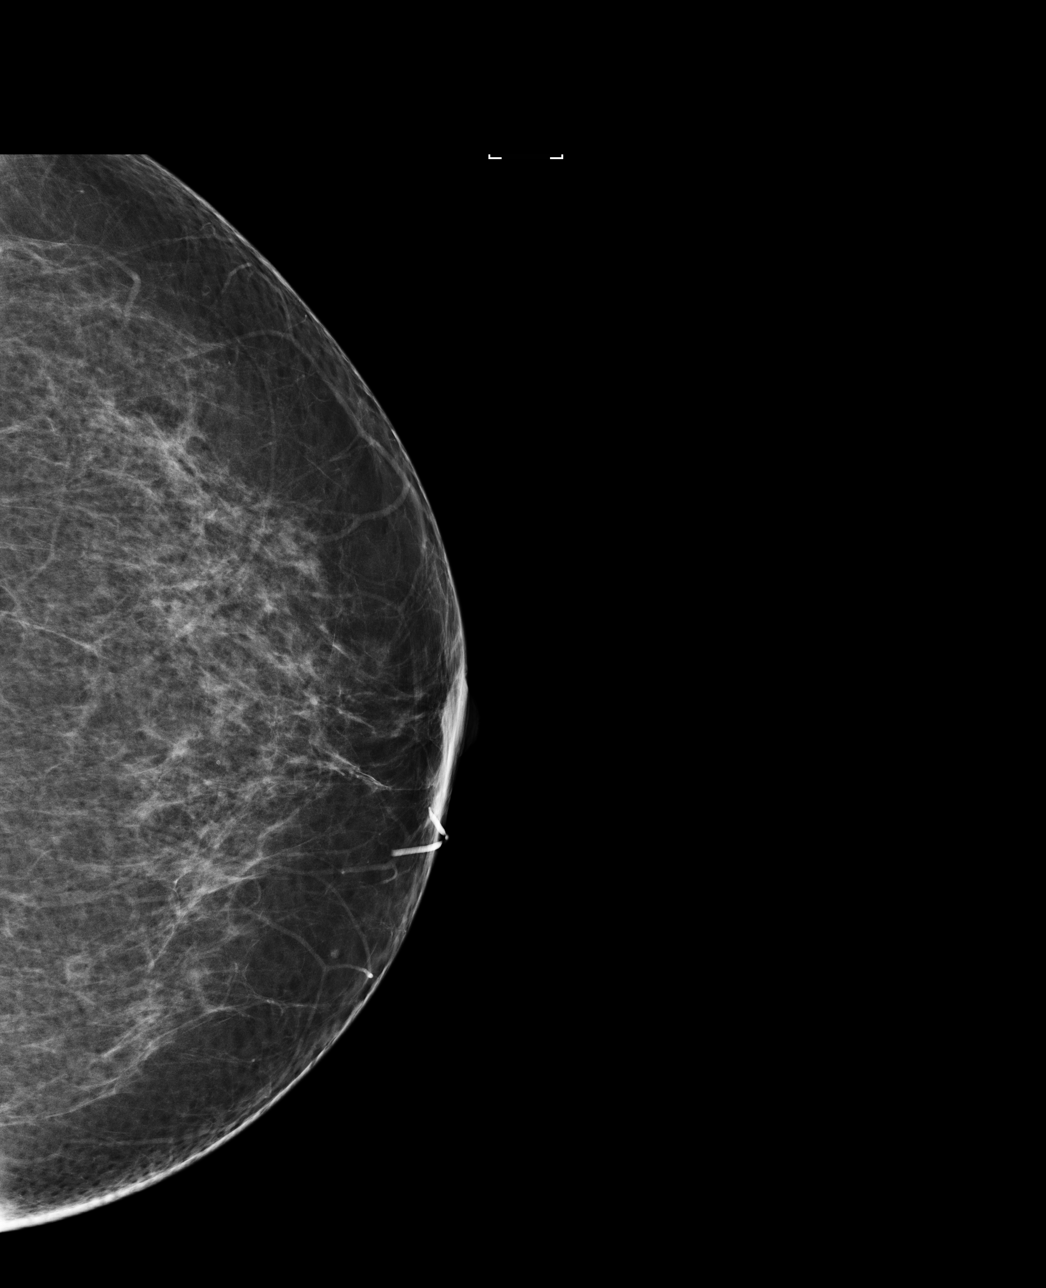

[L MLO]
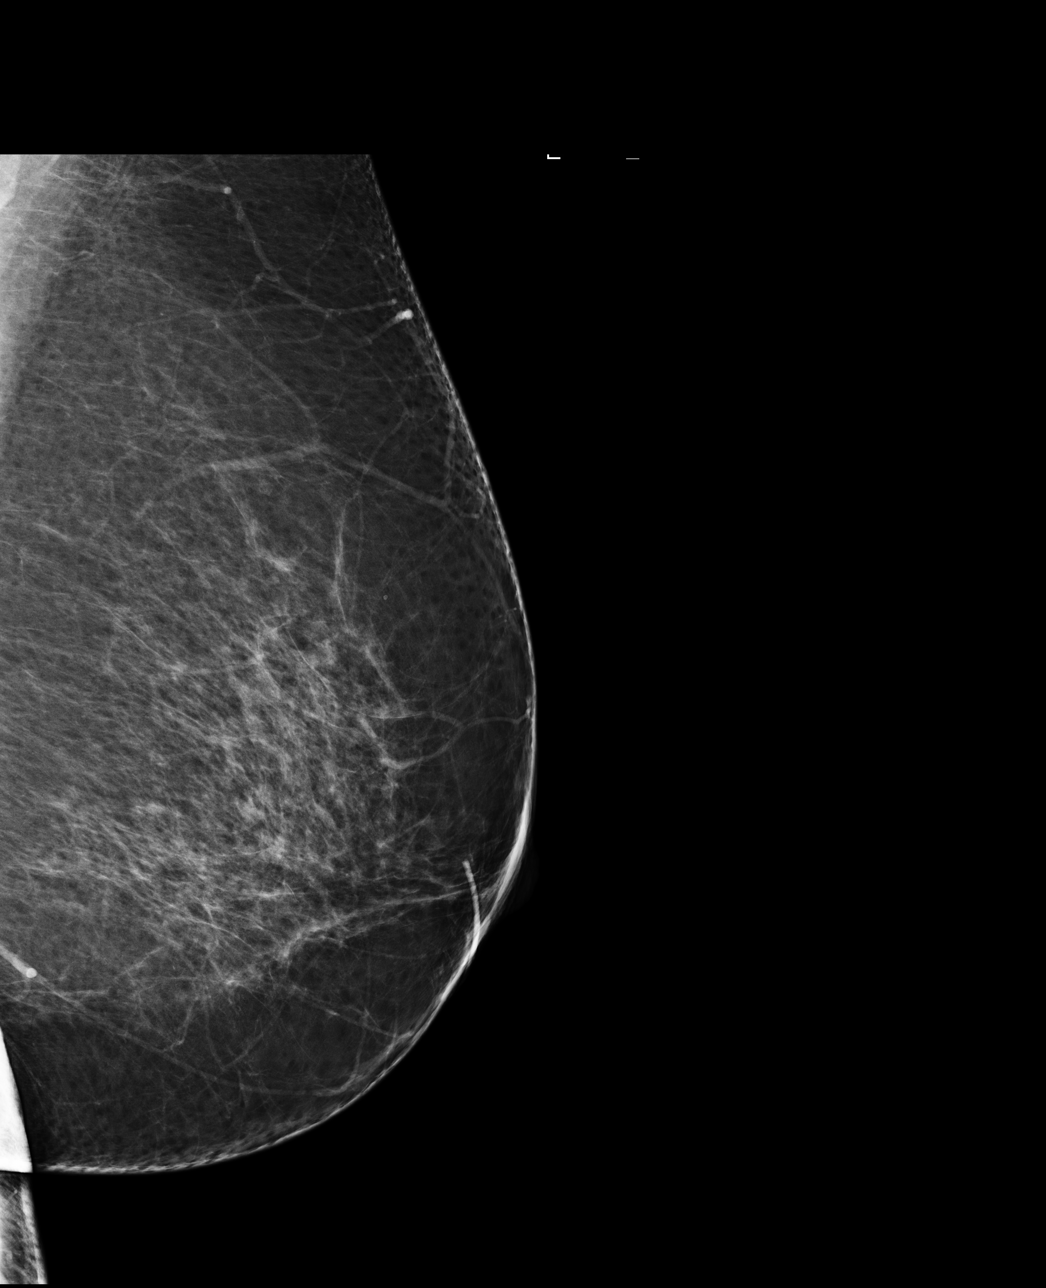

[4 of 4 positions shown; findings below may reference images not displayed]

ACR Breast Density Category b: There are scattered areas of
fibroglandular density.
FINDINGS: There are no findings suspicious for malignancy. Images were
processed with CAD.
IMPRESSION: No mammographic evidence of malignancy. A result letter of this
screening mammogram will be mailed directly to the patient.

RECOMMENDATION:
Screening mammogram in one year. (Code:[US])

BI-RADS CATEGORY  1: Negative.

## 2019-08-01 ENCOUNTER — Other Ambulatory Visit: Payer: Self-pay

## 2019-08-03 ENCOUNTER — Ambulatory Visit: Payer: Medicare Other | Admitting: Internal Medicine

## 2019-08-03 ENCOUNTER — Encounter: Payer: Self-pay | Admitting: Internal Medicine

## 2019-08-03 ENCOUNTER — Other Ambulatory Visit: Payer: Self-pay

## 2019-08-03 VITALS — BP 142/80 | HR 75 | Temp 98.1°F | Ht 66.0 in | Wt 226.2 lb

## 2019-08-03 DIAGNOSIS — R739 Hyperglycemia, unspecified: Secondary | ICD-10-CM | POA: Diagnosis not present

## 2019-08-03 DIAGNOSIS — E1165 Type 2 diabetes mellitus with hyperglycemia: Secondary | ICD-10-CM

## 2019-08-03 DIAGNOSIS — E785 Hyperlipidemia, unspecified: Secondary | ICD-10-CM

## 2019-08-03 LAB — GLUCOSE, POCT (MANUAL RESULT ENTRY): POC Glucose: 217 mg/dl — AB (ref 70–99)

## 2019-08-03 MED ORDER — GLIMEPIRIDE 2 MG PO TABS: 4.0000 mg | ORAL_TABLET | Freq: Every day | ORAL | 1 refills | Status: DC

## 2019-08-03 NOTE — Patient Instructions (Addendum)
-   Increase Glimepiride 2 mg, TWO tablets before Breakfast  - Continue Metformin 500 mg, 2 tablets before Breakfast and 2 tablets before Supper   - Exercise with brisk walking for 150 minutes a week    Choose healthy, lower carb lower calorie snacks: toss salad, cooked vegetables, cottage cheese, peanut butter, low fat cheese / string cheese, lower sodium deli meat, tuna salad or chicken salad      HOW TO TREAT LOW BLOOD SUGARS (Blood sugar LESS THAN 70 MG/DL)  Please follow the RULE OF 15 for the treatment of hypoglycemia treatment (when your (blood sugars are less than 70 mg/dL)    STEP 1: Take 15 grams of carbohydrates when your blood sugar is low, which includes:   3-4 GLUCOSE TABS  OR  3-4 OZ OF JUICE OR REGULAR SODA OR  ONE TUBE OF GLUCOSE GEL     STEP 2: RECHECK blood sugar in 15 MINUTES STEP 3: If your blood sugar is still low at the 15 minute recheck --> then, go back to STEP 1 and treat AGAIN with another 15 grams of carbohydrates.

## 2019-08-03 NOTE — Progress Notes (Signed)
Name: Caroline White  MRN/ DOB: [id number], [birth year]   Age/ Sex: 69 y.o., female    PCP: Gaynelle Arabian, MD   Reason for Endocrinology Evaluation: Type 2 Diabetes Mellitus     Date of Initial Endocrinology Visit: 08/03/2019     PATIENT IDENTIFIER: [name] is a 69 y.o. female with a past medical history of T2DM, HTN and Dyslipidemia. The patient presented for initial endocrinology clinic visit on 08/03/2019 for consultative assistance with her diabetes management.    HPI: Caroline White was    Diagnosed with DM years ago Prior Medications tried/Intolerance: as listed  Currently checking blood sugars rarely  Hypoglycemia episodes : no            Hemoglobin A1c has ranged from < 7.0%  , peaking at 9.5% in 2021. Patient required assistance for hypoglycemia: no  Patient has required hospitalization within the last 1 year from hyper or hypoglycemia: no  In terms of diet, the patient eats 3 meals a day, snacks occasionally, avoids sugar-sweetened beverages    HOME DIABETES REGIMEN: Glimepiride 2 mg daily  Metformin 500 mg 2 tabs BID    Statin: yes ACE-I/ARB: yes Prior Diabetic Education: Yes   METER DOWNLOAD SUMMARY: Did not bring    DIABETIC COMPLICATIONS: Microvascular complications:    Denies: retinopathy, neuropathy , CKD   Last eye exam: Completed 2020  Macrovascular complications:    Denies: CAD, PVD, CVA   PAST HISTORY: Past Medical History:  Past Medical History:  Diagnosis Date  . Diabetes mellitus    type II--no medications  diet control  . History of kidney stones    "Passed"  . Hyperlipidemia   . Hypertension   . 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 Path confirmed intraductal papilloma   . Nipple discharge    bloody from Lt breast  . Psoriasis    Past Surgical History:  Past Surgical History:  Procedure Laterality Date  . ABDOMINAL HYSTERECTOMY  1998   full  . BREAST DUCTAL SYSTEM  EXCISION  06/18/2011   Procedure: EXCISION DUCTAL SYSTEM BREAST;  Surgeon: Haywood Lasso, MD;  Location: [facility];  Service: General;  Laterality: Left;  Left Breast Ductal Excision  . BREAST EXCISIONAL BIOPSY Left   . nasal surgery 1965    . TONSILLECTOMY AND ADENOIDECTOMY  1958  . TUBAL LIGATION        Social History:  reports that she has never smoked. She has never used smokeless tobacco. She reports that she does not drink alcohol or use drugs. Family History:  Family History  Problem Relation Age of Onset  . Cancer Mother        uterine and stomach  . Stroke Father   . Cancer Maternal Grandmother        breast  . Breast cancer Maternal Grandmother      HOME MEDICATIONS: Allergies as of 08/03/2019      Reactions   Fish Oil    Heart racing   Sulfa Drugs Cross Reactors Other (See Comments)   Unknown   Zestril [lisinopril] Cough      Medication List       Accurate as of Aug 03, 2019  9:39 AM. If you have any questions, ask your nurse or doctor.        STOP taking these medications   pravastatin 40 MG tablet Commonly known as: PRAVACHOL Stopped by: Dorita Sciara, MD     TAKE these medications  amLODipine 10 MG tablet Commonly known as: NORVASC Take 10 mg by mouth daily.   glimepiride 2 MG tablet Commonly known as: AMARYL Take 2 mg by mouth every morning.   losartan-hydrochlorothiazide 100-12.5 MG tablet Commonly known as: HYZAAR Take 1 tablet by mouth daily before breakfast.   metFORMIN 500 MG 24 hr tablet Commonly known as: GLUCOPHAGE-XR Take 1,000 mg by mouth 2 (two) times daily. 2 tabs 2 times daily   metoprolol succinate 25 MG 24 hr tablet Commonly known as: TOPROL-XL Take 50 mg by mouth daily.   multivitamin capsule Take 1 capsule by mouth daily.   ONE TOUCH ULTRA TEST test strip Generic drug: glucose blood 2 (two) times a week.   PSORIASIN EX Apply 1 application topically daily. For psoriasis  --over the counter   rosuvastatin  40 MG tablet Commonly known as: CRESTOR Take 40 mg by mouth daily.        ALLERGIES: Allergies  Allergen Reactions  . Fish Oil     Heart racing  . Sulfa Drugs Cross Reactors Other (See Comments)    Unknown   . Zestril [Lisinopril] Cough     REVIEW OF SYSTEMS: A comprehensive ROS was conducted with the patient and is negative except as per HPI and below:  Review of Systems  Gastrointestinal: Positive for diarrhea. Negative for nausea.  Genitourinary: Positive for frequency.  Neurological: Positive for tingling.       Occasional in the feet   Endo/Heme/Allergies: Positive for polydipsia.      OBJECTIVE:   VITAL SIGNS: BP (!) 142/80 (BP Location: Left Arm, Patient Position: Sitting, Cuff Size: Large)   Pulse 75   Temp 98.1 F (36.7 C)   Ht 5\' 6"  (1.676 m)   Wt 226 lb 3.2 oz (102.6 kg)   SpO2 98%   BMI 36.51 kg/m    PHYSICAL EXAM:  General: Pt appears well and is in NAD  HEENT:  Eyes: External eye exam normal without stare, lid lag or exophthalmos.  EOM intact.    Neck: General: Supple without adenopathy or carotid bruits. Thyroid: Thyroid size normal.  No goiter or nodules appreciated. No thyroid bruit.  Lungs: Clear with good BS bilat with no rales, rhonchi, or wheezes  Heart: RRR with normal S1 and S2 and no gallops; no murmurs; no rub  Abdomen: Normoactive bowel sounds, soft, nontender, without masses or organomegaly palpable  Extremities:  Lower extremities - No pretibial edema. No lesions.  Skin: Normal texture and temperature to palpation. No rash noted. No Acanthosis nigricans/skin tags. No lipohypertrophy.  Neuro: MS is good with appropriate affect, pt is alert and Ox3    DM foot exam: 08/03/19  The skin of the feet is intact without sores or ulcerations. The pedal pulses are 2+ on right and 2+ on left. The sensation is intact to a screening 5.07, 10 gram monofilament bilaterally   DATA REVIEWED: 07/20/2019   BUN/Cr 10/0.72 mg/dL  GFR 80 A1c  9.5%  Tg 206 HDL 33 LDL 80   MA/Cr ratio 56.7   ASSESSMENT / PLAN / RECOMMENDATIONS:   1) Type 2 Diabetes Mellitus, Poorly controlled, Without complications - Most recent A1c of 9.5 %. Goal A1c < 7.0 %.    Plan: GENERAL: I have discussed with the patient the pathophysiology of diabetes. We went over the natural progression of the disease. We talked about both insulin resistance and insulin deficiency. We stressed the importance of lifestyle changes including diet and exercise. I explained the complications associated  with diabetes including retinopathy, nephropathy, neuropathy as well as increased risk of cardiovascular disease. We went over the benefit seen with glycemic control.    I explained to the patient that diabetic patients are at higher than normal risk for amputations.   We discussed the importance of low carb diet, avoiding sugar-sweetened beverages and snacks.We discussed low carb options.    MEDICATIONS: - Increase Glimepiride 2 mg, TWO tablets before Breakfast  - Continue Metformin 500 mg, 2 tablets before Breakfast and 2 tablets before Supper   EDUCATION / INSTRUCTIONS:  BG monitoring instructions: Patient is instructed to check her blood sugars 1 times a day, fasting .  Call [facility] if: BG persistently < 70 or > 300. . I reviewed the Rule of 15 for the treatment of hypoglycemia in detail with the patient. Literature supplied.   2) Diabetic complications:   Eye: Does not have known diabetic retinopathy.   Neuro/ Feet: Does not have known diabetic peripheral neuropathy.  Renal: Patient does not have known baseline CKD. She is on an ACEI/ARB at present.   3) Lipids: Patient is on Rosuvastatin. LDL at goal. Discussed cardiovascular benefits of statins     F/U in 3 months    Signed electronically by: Mack Guise, MD  [facility]  [address] [place], [place] [address]  Phone: [phone] FAX: [phone]   CC: Gaynelle Arabian, MD [address] [place] [address] Phone: [phone]  Fax: [phone]    Return to Endocrinology clinic as below: No future appointments.

## 2019-08-04 DIAGNOSIS — E1165 Type 2 diabetes mellitus with hyperglycemia: Secondary | ICD-10-CM | POA: Insufficient documentation

## 2019-08-04 DIAGNOSIS — E785 Hyperlipidemia, unspecified: Secondary | ICD-10-CM | POA: Insufficient documentation

## 2019-11-03 ENCOUNTER — Encounter: Payer: Self-pay | Admitting: Internal Medicine

## 2019-11-03 ENCOUNTER — Ambulatory Visit: Payer: Medicare Other | Admitting: Internal Medicine

## 2019-11-03 ENCOUNTER — Other Ambulatory Visit: Payer: Self-pay

## 2019-11-03 VITALS — BP 130/70 | HR 78 | Resp 18 | Ht 66.0 in | Wt 219.4 lb

## 2019-11-03 DIAGNOSIS — E1165 Type 2 diabetes mellitus with hyperglycemia: Secondary | ICD-10-CM | POA: Diagnosis not present

## 2019-11-03 LAB — POCT GLUCOSE (DEVICE FOR HOME USE): Glucose Fasting, POC: 334 mg/dL — AB (ref 70–99)

## 2019-11-03 LAB — POCT GLYCOSYLATED HEMOGLOBIN (HGB A1C): Hemoglobin A1C: 9.1 % — AB (ref 4.0–5.6)

## 2019-11-03 MED ORDER — METFORMIN HCL ER 500 MG PO TB24: 1000.0000 mg | ORAL_TABLET | Freq: Two times a day (BID) | ORAL | 3 refills | Status: DC

## 2019-11-03 MED ORDER — RYBELSUS 3 MG PO TABS: 3.0000 mg | ORAL_TABLET | Freq: Every day | ORAL | 1 refills | Status: DC

## 2019-11-03 MED ORDER — GLIMEPIRIDE 4 MG PO TABS: 4.0000 mg | ORAL_TABLET | Freq: Every day | ORAL | 1 refills | Status: DC

## 2019-11-03 NOTE — Progress Notes (Signed)
Name: Caroline White  Age/ Sex: 69 y.o., female   MRN/ DOB: [id number], June 29, 1950     PCP: Gaynelle Arabian, MD   Reason for Endocrinology Evaluation: Type 2 Diabetes Mellitus  Initial Endocrine Consultative Visit: 08/03/2019    PATIENT IDENTIFIER: Caroline White is a 69 y.o. female with a past medical history of T2DM, HTN and Dyslipidemia . The patient has followed with Endocrinology clinic since 08/03/2019 for consultative assistance with management of her diabetes.  DIABETIC HISTORY:  Ms. Collar was diagnosed with T2DM many years ago, Has been on oral glycemic agents since her diagnosis. No insulin in the past.    On her initial visit to our clinic she had an A1c of 9.5%. She was on metformin and Glimepiride , we increased Glimepiride   SUBJECTIVE:   During the last visit (08/03/2019): A1c 9.5% . Continued Metformin and increased Glimepiride   Today (11/03/2019): [name] is here for a 3 month follow up on diabetes management.  She checks her blood sugars occasionally .The patient has not  had hypoglycemic episodes since the last clinic visit     Denies nausea but has loose BM's up to 4x a day.      HOME DIABETES REGIMEN:  Glimepiride 2 mg, TWO tablets before Breakfast  Metformin 500 mg, 2 tablets before Breakfast and 2 tablets before Supper      Statin: yes ACE-I/ARB: yes    METER DOWNLOAD SUMMARY: Did not bring    DIABETIC COMPLICATIONS: Microvascular complications:    Denies: CKD, retinopathy, neuropathy  Last Eye Exam: Completed 10/2019 ( home camera test )   Macrovascular complications:    Denies: CAD, CVA, PVD   HISTORY:  Past Medical History:  Past Medical History:  Diagnosis Date  . Diabetes mellitus    type II--no medications  diet control  . History of kidney stones    "Passed"  . Hyperlipidemia   . Hypertension   . 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 Path confirmed intraductal  papilloma   . Nipple discharge    bloody from Lt breast  . Psoriasis    Past Surgical History:  Past Surgical History:  Procedure Laterality Date  . ABDOMINAL HYSTERECTOMY  1998   full  . BREAST DUCTAL SYSTEM EXCISION  06/18/2011   Procedure: EXCISION DUCTAL SYSTEM BREAST;  Surgeon: Haywood Lasso, MD;  Location: [facility];  Service: General;  Laterality: Left;  Left Breast Ductal Excision  . BREAST EXCISIONAL BIOPSY Left   . nasal surgery 1965    . TONSILLECTOMY AND ADENOIDECTOMY  1958  . TUBAL LIGATION      Social History:  reports that she has never smoked. She has never used smokeless tobacco. She reports that she does not drink alcohol and does not use drugs. Family History:  Family History  Problem Relation Age of Onset  . Cancer Mother        uterine and stomach  . Stroke Father   . Cancer Maternal Grandmother        breast  . Breast cancer Maternal Grandmother      HOME MEDICATIONS: Allergies as of 11/03/2019      Reactions   Fish Oil    Heart racing   Sulfa Drugs Cross Reactors Other (See Comments)   Unknown   Zestril [lisinopril] Cough      Medication List       Accurate as of November 03, 2019  9:43 AM. If you have any questions,  ask your nurse or doctor.        amLODipine 10 MG tablet Commonly known as: NORVASC Take 10 mg by mouth daily.   glimepiride 2 MG tablet Commonly known as: AMARYL Take 2 tablets (4 mg total) by mouth daily with breakfast.   losartan-hydrochlorothiazide 100-12.5 MG tablet Commonly known as: HYZAAR Take 1 tablet by mouth daily before breakfast.   metFORMIN 500 MG 24 hr tablet Commonly known as: GLUCOPHAGE-XR Take 1,000 mg by mouth 2 (two) times daily. 2 tabs 2 times daily   metoprolol succinate 25 MG 24 hr tablet Commonly known as: TOPROL-XL Take 50 mg by mouth daily.   multivitamin capsule Take 1 capsule by mouth daily.   ONE TOUCH ULTRA TEST test strip Generic drug: glucose blood 2 (two) times a week.    PSORIASIN EX Apply 1 application topically daily. For psoriasis  --over the counter   rosuvastatin 40 MG tablet Commonly known as: CRESTOR Take 40 mg by mouth daily.        OBJECTIVE:   Vital Signs: BP 130/70   Pulse 78   Resp 18   Ht 5\' 6"  (1.676 m)   Wt 219 lb 6.4 oz (99.5 kg)   SpO2 96%   BMI 35.41 kg/m   Wt Readings from Last 3 Encounters:  11/03/19 219 lb 6.4 oz (99.5 kg)  08/03/19 226 lb 3.2 oz (102.6 kg)  06/28/13 211 lb 12.8 oz (96.1 kg)     Exam: General: Pt appears well and is in NAD  Lungs: Clear with good BS bilat with no rales, rhonchi, or wheezes  Heart: RRR with normal S1 and S2 and no gallops; no murmurs; no rub  Abdomen: Normoactive bowel sounds, soft, nontender, without masses or organomegaly palpable  Extremities: No pretibial edema.  Neuro: MS is good with appropriate affect, pt is alert and Ox3     DM foot exam: 08/03/19  The skin of the feet is intact without sores or ulcerations. The pedal pulses are 2+ on right and 2+ on left. The sensation is intact to a screening 5.07, 10 gram monofilament bilaterally   DATA REVIEWED:   07/20/2019   BUN/Cr 10/0.72 mg/dL  GFR 80 A1c 9.5%  Tg 206 HDL 33 LDL 80   MA/Cr ratio 56.7    In-office BG 334 mg/dL   ASSESSMENT / PLAN / RECOMMENDATIONS:   1) Type 2 Diabetes Mellitus, Poorly controlled, With microalbuminuria complications - Most recent A1c of 9.1 %. Goal A1c < 7.0 %.    - A1c slightly better  - I have strongly encouraged her to check glucose at home, even if its 3x a week and no notify me if BG's > 200 mg/dL  - She is requesting to try Ozempic. She has not hx of pancreatitis but has chronic diarrhea it seems since having a colonoscopy 8 yrs ago. I cautioned her that this could get worse and to stop the medicine should this happen - Pt opted for Rybelsus   MEDICATIONS: - Continue Metformin 2 tablets twice daily  - Will change Glimepiride to 4 mg, 1 tablet daily  - Start  Rybelsus 3 mg daily with breakfast     EDUCATION / INSTRUCTIONS:  BG monitoring instructions: Patient is instructed to check her blood sugars 3 times a week  Call [facility] if: BG persistently < 70 or > 300. . I reviewed the Rule of 15 for the treatment of hypoglycemia in detail with the patient. Literature supplied.  2) Diabetic complications:   Eye: Does not have known diabetic retinopathy.   Neuro/ Feet: Does not have known diabetic peripheral neuropathy .   Renal: Patient does not have known baseline CKD. She   is  on an ACEI/ARB at present    F/U in 3 months    Signed electronically by: Mack Guise, MD  [facility]  [facility] [address] [place], [place] [address] Phone: [phone] FAX: [phone]   CC: Gaynelle Arabian, MD [address] [place] [address] Phone: [phone]  Fax: [phone]  Return to Endocrinology clinic as below: No future appointments.

## 2019-11-03 NOTE — Patient Instructions (Signed)
-   Continue Metformin 2 tablets twice daily  - Will change Glimepiride to 4 mg, 1 tablet daily  - Start Rybelsus 3 mg daily with breakfast       HOW TO TREAT LOW BLOOD SUGARS (Blood sugar LESS THAN 70 MG/DL)  Please follow the RULE OF 15 for the treatment of hypoglycemia treatment (when your (blood sugars are less than 70 mg/dL)    STEP 1: Take 15 grams of carbohydrates when your blood sugar is low, which includes:   3-4 GLUCOSE TABS  OR  3-4 OZ OF JUICE OR REGULAR SODA OR  ONE TUBE OF GLUCOSE GEL     STEP 2: RECHECK blood sugar in 15 MINUTES STEP 3: If your blood sugar is still low at the 15 minute recheck --> then, go back to STEP 1 and treat AGAIN with another 15 grams of carbohydrates.

## 2020-02-21 ENCOUNTER — Encounter: Payer: Self-pay | Admitting: Gastroenterology

## 2020-02-23 ENCOUNTER — Ambulatory Visit: Payer: Medicare Other | Admitting: Internal Medicine

## 2020-03-14 ENCOUNTER — Other Ambulatory Visit: Payer: Self-pay | Admitting: Family Medicine

## 2020-03-14 DIAGNOSIS — Z1231 Encounter for screening mammogram for malignant neoplasm of breast: Secondary | ICD-10-CM

## 2020-03-27 ENCOUNTER — Ambulatory Visit: Payer: Medicare Other | Admitting: Internal Medicine

## 2020-03-27 DIAGNOSIS — R413 Other amnesia: Secondary | ICD-10-CM | POA: Diagnosis not present

## 2020-03-27 DIAGNOSIS — E1169 Type 2 diabetes mellitus with other specified complication: Secondary | ICD-10-CM | POA: Diagnosis not present

## 2020-03-27 DIAGNOSIS — I10 Essential (primary) hypertension: Secondary | ICD-10-CM | POA: Diagnosis not present

## 2020-03-27 DIAGNOSIS — L409 Psoriasis, unspecified: Secondary | ICD-10-CM | POA: Diagnosis not present

## 2020-03-28 ENCOUNTER — Encounter: Payer: Self-pay | Admitting: Neurology

## 2020-03-29 ENCOUNTER — Ambulatory Visit (INDEPENDENT_AMBULATORY_CARE_PROVIDER_SITE_OTHER): Payer: Medicare Other | Admitting: Gastroenterology

## 2020-03-29 ENCOUNTER — Encounter: Payer: Self-pay | Admitting: Gastroenterology

## 2020-03-29 VITALS — BP 140/70 | HR 72 | Ht 66.0 in | Wt 206.0 lb

## 2020-03-29 DIAGNOSIS — R197 Diarrhea, unspecified: Secondary | ICD-10-CM

## 2020-03-29 DIAGNOSIS — R634 Abnormal weight loss: Secondary | ICD-10-CM | POA: Diagnosis not present

## 2020-03-29 DIAGNOSIS — R1032 Left lower quadrant pain: Secondary | ICD-10-CM

## 2020-03-29 NOTE — Progress Notes (Signed)
Chief Complaint:   Referring Provider:  Gaynelle Arabian, MD      ASSESSMENT AND PLAN;   #1. Diarrhea. D/d includes IBS-D, infectious causes, diarrhea d/t meds (like metformin), microscopic colitis, IBD, malabsorption, celiac disease and hypothyroidism.  #2. LLQ pain with wt loss.  #3. Assoc DM (uncontrolled HBA1c 9.5 07/2019), HTN, HLD, DM2, early dementia.  Plan: -Stool studies for GI Pathogen (includes C. Diff), giardia antigen, fecal elastase, fat and Calprotectin. -CBC, CMP, CRP, HBA1c, lipase, celiac screen and TSH -CT AP with PO/IV contrast -If still with problems, would need to proceed with colon with Bx. Pt reluctant at this time. -Imodium AD Bid for now. -Discussed in detail with daughter-in-law who is very reliable.    HPI:    Caroline White is a 70 y.o. female  Accompanied by her daughter-in-law With HTN, HLD, DM, early dementia, psoriasis Who has been getting forgetful regarding taking medications.  Currently her son and daughter-in-law has taken over her care.  She has appointment with neurology regarding ?  Dementia.   C/O diarrhea x over last 10 years ever since her last colonoscopy 11/2010.  She has 3-4 BMs/day, loose, mostly after eating, without nocturnal symptoms.  No melena or hematochezia.  Lately has been having left lower quadrant abdominal pain with bloating which only gets partially relieved on defecation.  No fever or chills.  Seen in ED @ [facility] for UTI 02/07/2020-treated with cephalexin x 3 days.  It was found out that she was not taking her meds as she was getting very forgetful.  Metformin has been restarted.  She could not tell me if the diarrhea has gotten worse after taking antibiotics/reinstituting metformin.  She does use well water. No sodas, chocolates, chewing gums, artificial sweeteners and candy. No NSAIDs.  Nobody else in the family has diarrhea.  There has been documented weight loss as below  Wt Readings from Last 3  Encounters:  03/29/20 206 lb (93.4 kg)  11/03/19 219 lb 6.4 oz (99.5 kg)  08/03/19 226 lb 3.2 oz (102.6 kg)   Denies having any upper GI symptoms including nausea, vomiting, heartburn, regurgitation, odynophagia or dysphagia.  Labs reviewed from 07/20/2019: glc 313, BUN/creatinine 10/0.7, normal CMP with normal LFTs.  AST 14, ALT 20.   Past GI procedures: -Colonoscopy 11/19/2010 ([facility]): Mild pancolonic diverticulosis, small internal hemorrhoids.  Otherwise normal to TI.  Repeat in 10 years.  SH -Married to Caroline White.   Past Medical History:  Diagnosis Date  . Diabetes mellitus    type II--no medications  diet control  . History of kidney stones    "Passed"  . Hyperlipidemia   . Hypertension   . 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 Path confirmed intraductal papilloma   . Nipple discharge    bloody from Lt breast  . Psoriasis     Past Surgical History:  Procedure Laterality Date  . ABDOMINAL HYSTERECTOMY  1998   full  . BREAST DUCTAL SYSTEM EXCISION  06/18/2011   Procedure: EXCISION DUCTAL SYSTEM BREAST;  Surgeon: Haywood Lasso, MD;  Location: [facility];  Service: General;  Laterality: Left;  Left Breast Ductal Excision  . BREAST EXCISIONAL BIOPSY Left   . COLONOSCOPY  11/19/2010   Mild pancolonic diverticuloisis. Small internal hemorrhoids. Otherwise normal colonoscopy to terminal ileum  . nasal surgery 1965    . TONSILLECTOMY AND ADENOIDECTOMY  1958  . TUBAL LIGATION      Family History  Problem Relation Age of Onset  .  Cancer Mother        uterine and stomach  . Stroke Father   . Cancer Maternal Grandmother        breast  . Breast cancer Maternal Grandmother     Social History   Tobacco Use  . Smoking status: Never Smoker  . Smokeless tobacco: Never Used  Substance Use Topics  . Alcohol use: No  . Drug use: No    Current Outpatient Medications  Medication Sig Dispense Refill  . amLODipine (NORVASC) 10 MG tablet Take 10 mg by mouth  daily.    . Coal Tar Extract (PSORIASIN EX) Apply 1 application topically daily. For psoriasis  --over the counter    . glimepiride (AMARYL) 4 MG tablet Take 1 tablet (4 mg total) by mouth daily before breakfast. 90 tablet 1  . metFORMIN (GLUCOPHAGE-XR) 500 MG 24 hr tablet Take 2 tablets (1,000 mg total) by mouth 2 (two) times daily. 2 tabs 2 times daily 360 tablet 3  . metoprolol succinate (TOPROL-XL) 25 MG 24 hr tablet Take 50 mg by mouth daily.     . Multiple Vitamin (MULTIVITAMIN) capsule Take 1 capsule by mouth daily.    . ONE TOUCH ULTRA TEST test strip 2 (two) times a week.    . rosuvastatin (CRESTOR) 40 MG tablet Take 40 mg by mouth daily.    . Semaglutide (RYBELSUS) 3 MG TABS Take 3 mg by mouth daily. 90 tablet 1  . Semaglutide,0.25 or 0.5MG /DOS, (OZEMPIC, 0.25 OR 0.5 MG/DOSE,) 2 MG/1.5ML SOPN inject 0.5 mg weekly     No current facility-administered medications for this visit.    Allergies  Allergen Reactions  . Fish Oil     Heart racing  . Sulfa Drugs Cross Reactors Other (See Comments)    Unknown   . Zestril [Lisinopril] Cough    Review of Systems:  Constitutional: Denies fever, chills, diaphoresis, appetite change and has fatigue.  HEENT: Denies photophobia, eye pain, redness, hearing loss, ear pain, congestion, sore throat, rhinorrhea, sneezing, mouth sores, neck pain, neck stiffness and tinnitus.   Respiratory: Denies SOB, DOE, cough, chest tightness,  and wheezing.   Cardiovascular: Denies chest pain, palpitations and leg swelling.  Genitourinary: Denies dysuria, urgency, frequency, hematuria, flank pain and difficulty urinating. No further urinary problems Musculoskeletal: Denies myalgias, has back pain, joint swelling, arthralgias and gait problem.  Skin: No rash.  Neurological: Denies dizziness, seizures, syncope, weakness, light-headedness, numbness and headaches.  Hematological: Denies adenopathy. Easy bruising, personal or family bleeding history   Psychiatric/Behavioral: Has anxiety or depression     Physical Exam:    BP 140/70   Pulse 72   Ht 5\' 6"  (1.676 m)   Wt 206 lb (93.4 kg)   BMI 33.25 kg/m  Wt Readings from Last 3 Encounters:  03/29/20 206 lb (93.4 kg)  11/03/19 219 lb 6.4 oz (99.5 kg)  08/03/19 226 lb 3.2 oz (102.6 kg)   Constitutional:  Well-developed, in no acute distress. Psychiatric: Normal mood and affect. Behavior is normal. HEENT: Pupils normal.  Conjunctivae are normal. No scleral icterus. Neck supple.  Cardiovascular: Normal rate, regular rhythm. No edema Pulmonary/chest: Effort normal and breath sounds normal. No wheezing, rales or rhonchi. Abdominal: Soft, nondistended. Nontender. Bowel sounds active throughout. There are no masses palpable. No hepatomegaly. Rectal: Deferred Neurological: Alert and oriented to person place and time. Skin: Skin is warm and dry. No rashes noted.  Data Reviewed: I have personally reviewed following labs and imaging studies  CBC: CBC Latest Ref  Rng & Units 06/16/2011  WBC 4.0 - 10.5 K/uL 5.2  Hemoglobin 12.0 - 15.0 g/dL 13.3  Hematocrit 36.0 - 46.0 % 40.6  Platelets 150 - 400 K/uL 189    CMP: CMP Latest Ref Rng & Units 06/16/2011  Glucose 70 - 99 mg/dL 87  BUN 6 - 23 mg/dL 22  Creatinine 0.50 - 1.10 mg/dL 0.75  Sodium 135 - 145 mEq/L 141  Potassium 3.5 - 5.1 mEq/L 4.0  Chloride 96 - 112 mEq/L 102  CO2 19 - 32 mEq/L 29  Calcium 8.4 - 10.5 mg/dL 10.0   Extensive notes were reviewed   Carmell Austria, MD 03/29/2020, 11:19 AM  Cc: Gaynelle Arabian, MD

## 2020-03-29 NOTE — Patient Instructions (Signed)
Your provider has requested that you go to the basement level for lab work at [facility] in [place] [address]. Press "B" on the elevator. The lab is located at the first door on the left as you exit the elevator.  We will contact you with a date for your CT   Follow up in 6 months.  Thank you,  Dr. Jackquline Denmark

## 2020-04-03 ENCOUNTER — Other Ambulatory Visit (INDEPENDENT_AMBULATORY_CARE_PROVIDER_SITE_OTHER): Payer: Medicare Other

## 2020-04-03 DIAGNOSIS — R634 Abnormal weight loss: Secondary | ICD-10-CM | POA: Diagnosis not present

## 2020-04-03 DIAGNOSIS — R1032 Left lower quadrant pain: Secondary | ICD-10-CM | POA: Diagnosis not present

## 2020-04-03 DIAGNOSIS — R197 Diarrhea, unspecified: Secondary | ICD-10-CM

## 2020-04-03 LAB — CBC
HCT: 36.4 % (ref 36.0–46.0)
Hemoglobin: 12.4 g/dL (ref 12.0–15.0)
MCHC: 34.2 g/dL (ref 30.0–36.0)
MCV: 88.2 fl (ref 78.0–100.0)
Platelets: 223 10*3/uL (ref 150.0–400.0)
RBC: 4.13 Mil/uL (ref 3.87–5.11)
RDW: 15.5 % (ref 11.5–15.5)
WBC: 8.8 10*3/uL (ref 4.0–10.5)

## 2020-04-03 LAB — COMPREHENSIVE METABOLIC PANEL
ALT: 17 U/L (ref 0–35)
AST: 16 U/L (ref 0–37)
Albumin: 4.7 g/dL (ref 3.5–5.2)
Alkaline Phosphatase: 59 U/L (ref 39–117)
BUN: 15 mg/dL (ref 6–23)
CO2: 28 mEq/L (ref 19–32)
Calcium: 9.8 mg/dL (ref 8.4–10.5)
Chloride: 100 mEq/L (ref 96–112)
Creatinine, Ser: 0.99 mg/dL (ref 0.40–1.20)
GFR: 58.33 mL/min — ABNORMAL LOW (ref >60.00)
Glucose, Bld: 96 mg/dL (ref 70–99)
Potassium: 3.8 mEq/L (ref 3.5–5.1)
Sodium: 137 mEq/L (ref 135–145)
Total Bilirubin: 0.8 mg/dL (ref 0.2–1.2)
Total Protein: 7.6 g/dL (ref 6.0–8.3)

## 2020-04-03 LAB — HIGH SENSITIVITY CRP: CRP, High Sensitivity: 13.28 mg/L — ABNORMAL HIGH (ref 0.000–5.000)

## 2020-04-03 LAB — HEMOGLOBIN A1C: Hgb A1c MFr Bld: 5.3 % (ref 4.6–6.5)

## 2020-04-03 LAB — LIPASE: Lipase: 25 U/L (ref 11.0–59.0)

## 2020-04-10 ENCOUNTER — Ambulatory Visit (HOSPITAL_BASED_OUTPATIENT_CLINIC_OR_DEPARTMENT_OTHER)
Admission: RE | Admit: 2020-04-10 | Discharge: 2020-04-10 | Disposition: A | Discharge status: Home / Self Care | Payer: Medicare Other | Source: Ambulatory Visit | Attending: Gastroenterology | Admitting: Gastroenterology

## 2020-04-10 ENCOUNTER — Encounter (HOSPITAL_BASED_OUTPATIENT_CLINIC_OR_DEPARTMENT_OTHER): Payer: Self-pay

## 2020-04-10 ENCOUNTER — Other Ambulatory Visit: Payer: Self-pay

## 2020-04-10 DIAGNOSIS — R634 Abnormal weight loss: Secondary | ICD-10-CM | POA: Diagnosis not present

## 2020-04-10 DIAGNOSIS — R109 Unspecified abdominal pain: Secondary | ICD-10-CM | POA: Diagnosis not present

## 2020-04-10 DIAGNOSIS — R197 Diarrhea, unspecified: Secondary | ICD-10-CM | POA: Diagnosis not present

## 2020-04-10 DIAGNOSIS — R1032 Left lower quadrant pain: Secondary | ICD-10-CM | POA: Diagnosis not present

## 2020-04-10 IMAGING — CT CT ABD-PELV W/ CM
2 of 5 series · 15 of 46 positions shown, 17 images · IV contrast (Omnipaque)
Comparison: CT [DATE].

CLINICAL DATA: Left lower quadrant abdominal pain with diarrhea and
weight loss. History of diabetes and hypertension.

EXAM:
CT ABDOMEN AND PELVIS WITH CONTRAST
TECHNIQUE: Multidetector CT imaging of the abdomen and pelvis was performed
using the standard protocol following bolus administration of
intravenous contrast.
CONTRAST:  100mL OMNIPAQUE IOHEXOL 300 MG/ML  SOLN

[Series 2: axial st · axial · 0.90mm/px · z∈[-497,-17]mm · 12 of 108 slices shown, 14 images]
[im 6/108  soft-tissue]
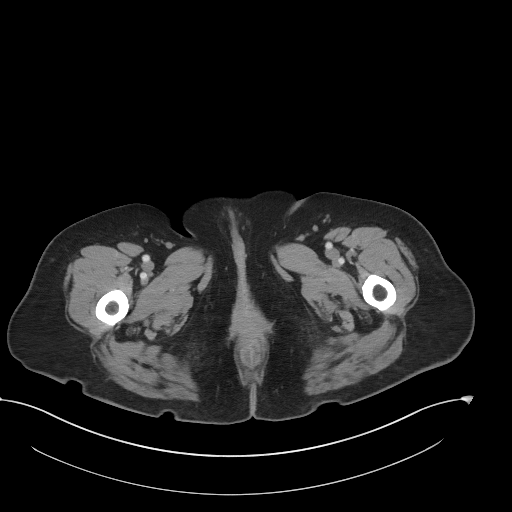
[im 6/108  bone]
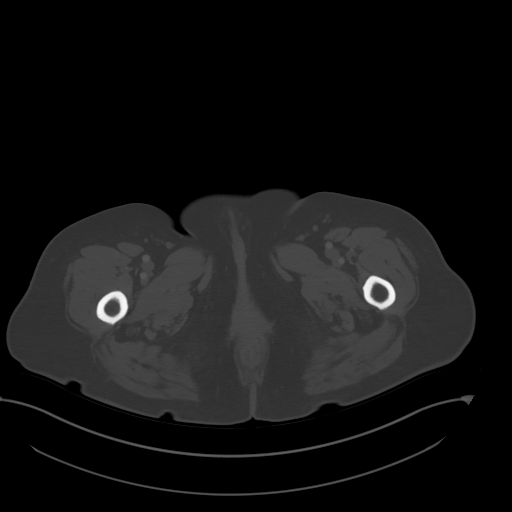
[im 18/108  soft-tissue]
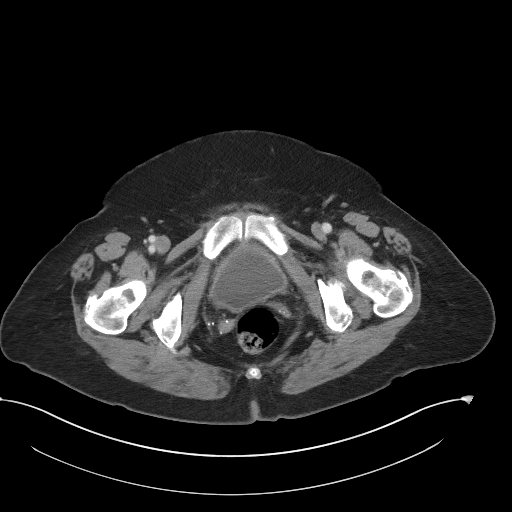
[im 24/108  soft-tissue]
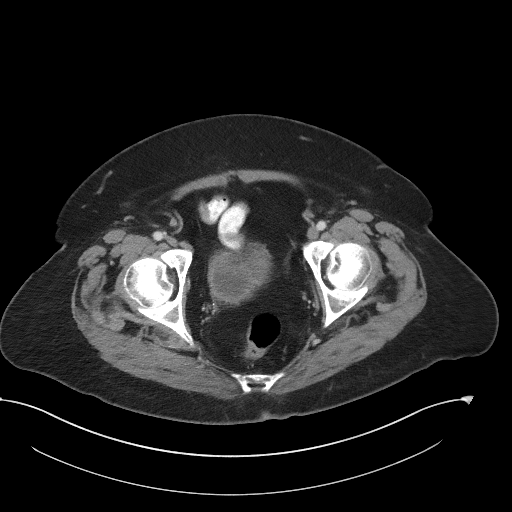
[im 30/108  soft-tissue]
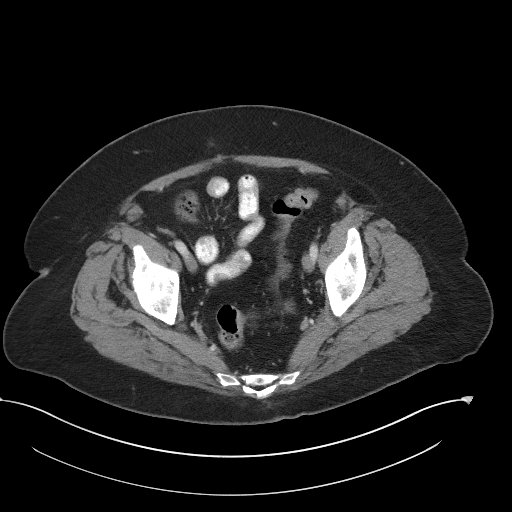
[im 42/108  soft-tissue]
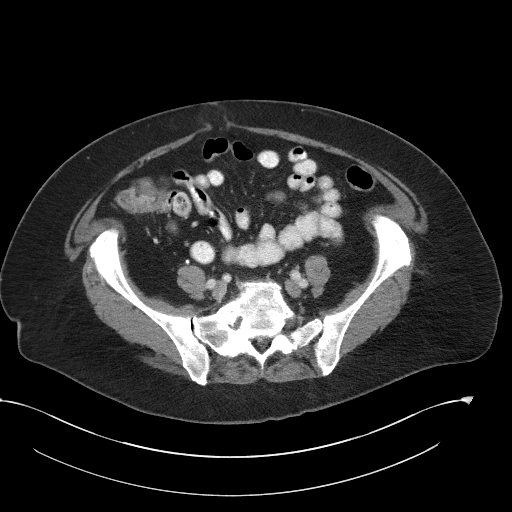
[im 48/108  soft-tissue]
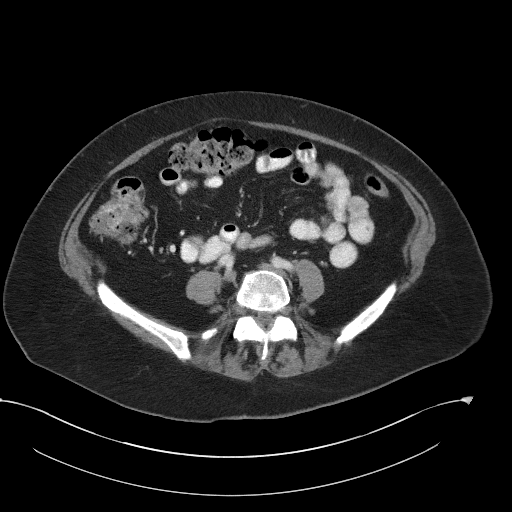
[im 60/108  soft-tissue]
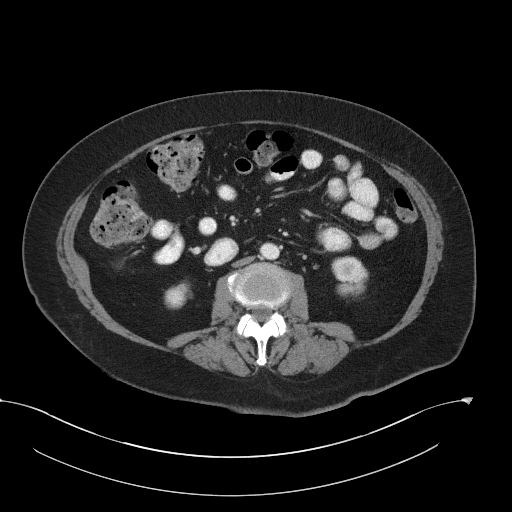
[im 66/108  soft-tissue]
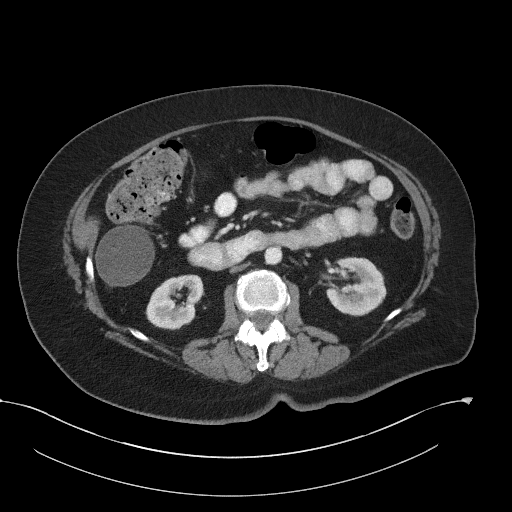
[im 78/108  soft-tissue]
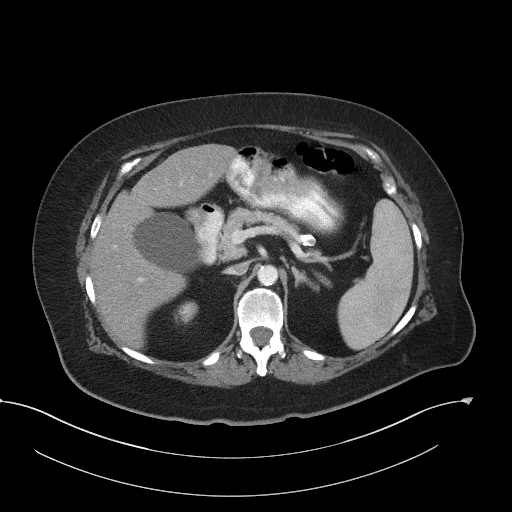
[im 78/108  bone]
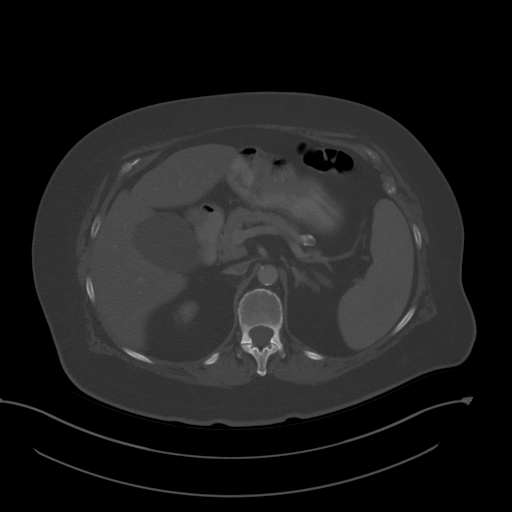
[im 84/108  soft-tissue]
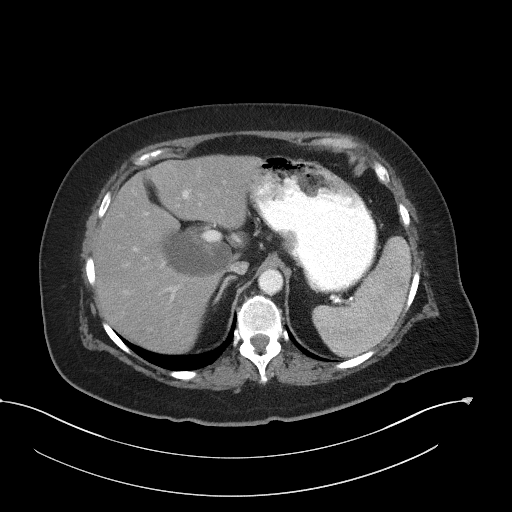
[im 90/108  soft-tissue]
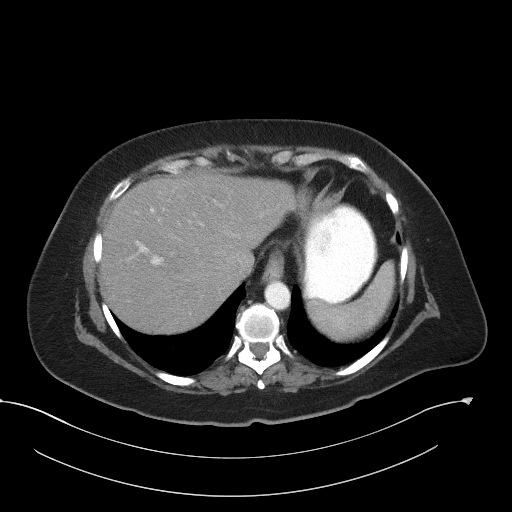
[im 102/108  soft-tissue]
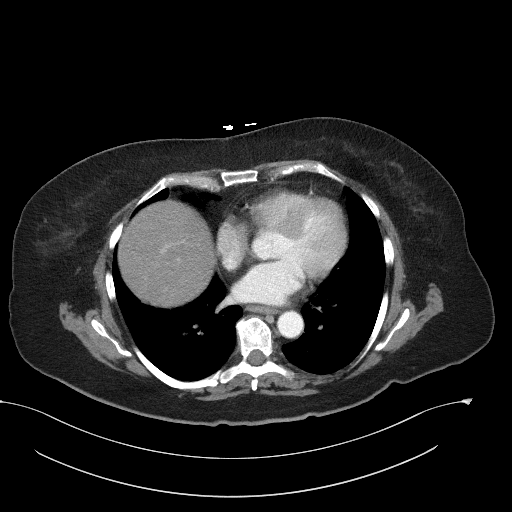

[Series 5: coronal st · coronal · 0.88mm/px · 3 of 101 slices shown]
[im 34/101  soft-tissue]
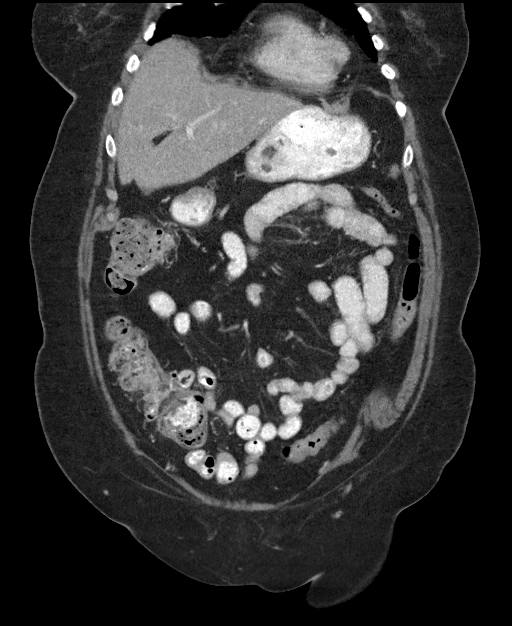
[im 45/101  soft-tissue]
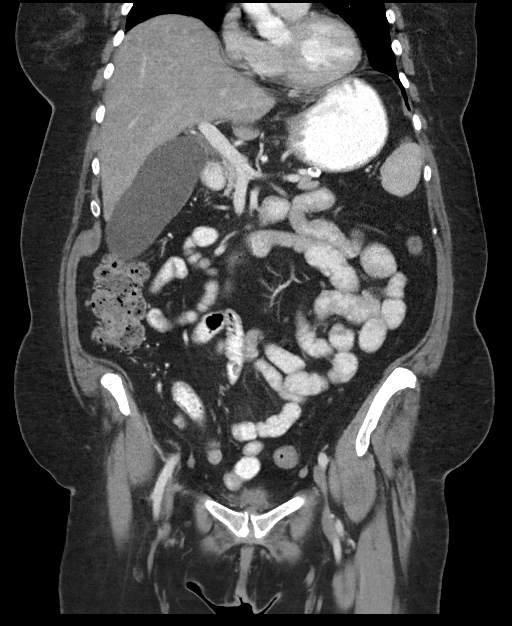
[im 56/101  soft-tissue]
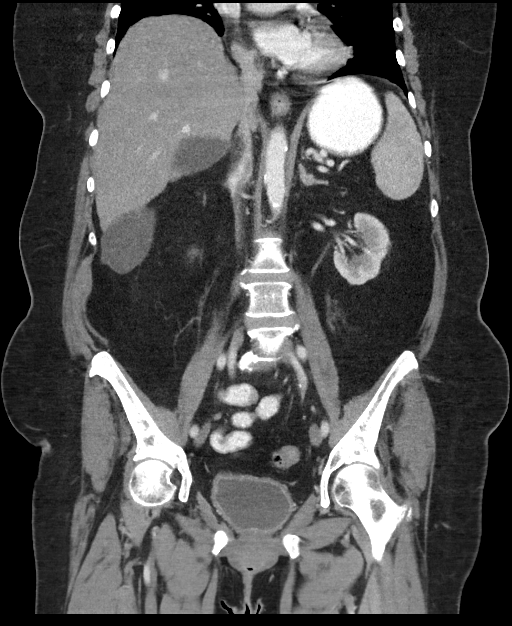

[15 of 46 positions shown; findings below may reference images not displayed]

FINDINGS: Lower chest: Clear lung bases. No significant pleural or pericardial
effusion. Coronary artery atherosclerosis noted.

Hepatobiliary: The liver is normal in density without suspicious
focal abnormality. The gallbladder is distended, measuring up to 15
cm in length on coronal image 51/5, not significantly changed from
the previous study. There is focal calcification in the wall of the
gallbladder fundus (image 47/2). No gallbladder wall thickening or
surrounding inflammatory change. There is no biliary dilatation.

Pancreas: Unremarkable. No pancreatic ductal dilatation or
surrounding inflammatory changes.

Spleen: Normal in size without focal abnormality.

Adrenals/Urinary Tract: Both adrenal glands appear normal. Both
kidneys demonstrate mild cortical thinning and low-density lesions
which are likely cysts. No evidence of enhancing renal mass, urinary
tract calculus or hydronephrosis. There is mild diffuse bladder wall
thickening and mucosal hyperenhancement. Evidence of pelvic floor
laxity with a small cystocele.

Stomach/Bowel: The stomach appears unremarkable for its degree of
distension. No evidence of bowel wall thickening, distention or
surrounding inflammatory change. The appendix appears normal. There
is moderate stool throughout the colon. There are scattered colonic
diverticula, without evidence of acute inflammation.

Vascular/Lymphatic: There are no enlarged abdominal or pelvic lymph
nodes. Mild aortic and branch vessel atherosclerosis. No acute
vascular findings. The portal, superior mesenteric and splenic veins
are patent. There is a stable peripherally calcified 14 mm splenic
artery aneurysm on image [DATE].

Reproductive: Hysterectomy.  No adnexal mass.

Other: Small umbilical hernia containing only fat, unchanged. As
above, evidence of pelvic floor laxity.

Musculoskeletal: No acute or significant osseous findings. Mild
lumbar spondylosis.
IMPRESSION: 1. Mild diffuse bladder wall thickening and mucosal
hyperenhancement, suggesting cystitis. Correlation with urinalysis
recommended.
2. No other acute findings or explanation for the patient's
symptoms.
3. Pelvic floor laxity with small cystocele.
4. Chronic gallbladder distension, similar to previous study. Focal
calcification within the gallbladder wall without wall thickening or
surrounding inflammation.
5. Aortic Atherosclerosis ([BZ]-[BZ]).

## 2020-04-10 MED ORDER — IOHEXOL 300 MG/ML  SOLN
100.0000 mL | Freq: Once | INTRAMUSCULAR | Status: AC | PRN
  Administered 2020-04-10: 100 mL via INTRAVENOUS

## 2020-04-12 ENCOUNTER — Telehealth: Payer: Self-pay | Admitting: Gastroenterology

## 2020-04-12 ENCOUNTER — Other Ambulatory Visit: Payer: Self-pay | Admitting: Gastroenterology

## 2020-04-12 DIAGNOSIS — R197 Diarrhea, unspecified: Secondary | ICD-10-CM

## 2020-04-12 DIAGNOSIS — R7982 Elevated C-reactive protein (CRP): Secondary | ICD-10-CM

## 2020-04-12 NOTE — Telephone Encounter (Signed)
Spoke to daughter informed of of results and recommendations.

## 2020-04-12 NOTE — Telephone Encounter (Signed)
Patients daughter in law returning the call for lab results

## 2020-04-19 ENCOUNTER — Other Ambulatory Visit: Payer: Medicare Other

## 2020-04-19 DIAGNOSIS — R197 Diarrhea, unspecified: Secondary | ICD-10-CM

## 2020-04-19 DIAGNOSIS — R7982 Elevated C-reactive protein (CRP): Secondary | ICD-10-CM

## 2020-04-23 ENCOUNTER — Ambulatory Visit
Admission: RE | Admit: 2020-04-23 | Discharge: 2020-04-23 | Disposition: A | Discharge status: Home / Self Care | Payer: Medicare Other | Source: Ambulatory Visit | Attending: Family Medicine | Admitting: Family Medicine

## 2020-04-23 ENCOUNTER — Ambulatory Visit: Payer: Medicare Other

## 2020-04-23 ENCOUNTER — Other Ambulatory Visit: Payer: Self-pay

## 2020-04-23 DIAGNOSIS — Z1231 Encounter for screening mammogram for malignant neoplasm of breast: Secondary | ICD-10-CM | POA: Diagnosis not present

## 2020-04-23 IMAGING — MG DIGITAL SCREENING BILAT W/ CAD
6 series · 6 of 6 positions shown · non-contrast
Comparison: Previous exam(s).

CLINICAL DATA: Screening.

EXAM:
DIGITAL SCREENING BILATERAL MAMMOGRAM WITH CAD
TECHNIQUE: Bilateral screening digital craniocaudal and mediolateral oblique
mammograms were obtained. The images were evaluated with
computer-aided detection.

[L MLO (1 of 2)]
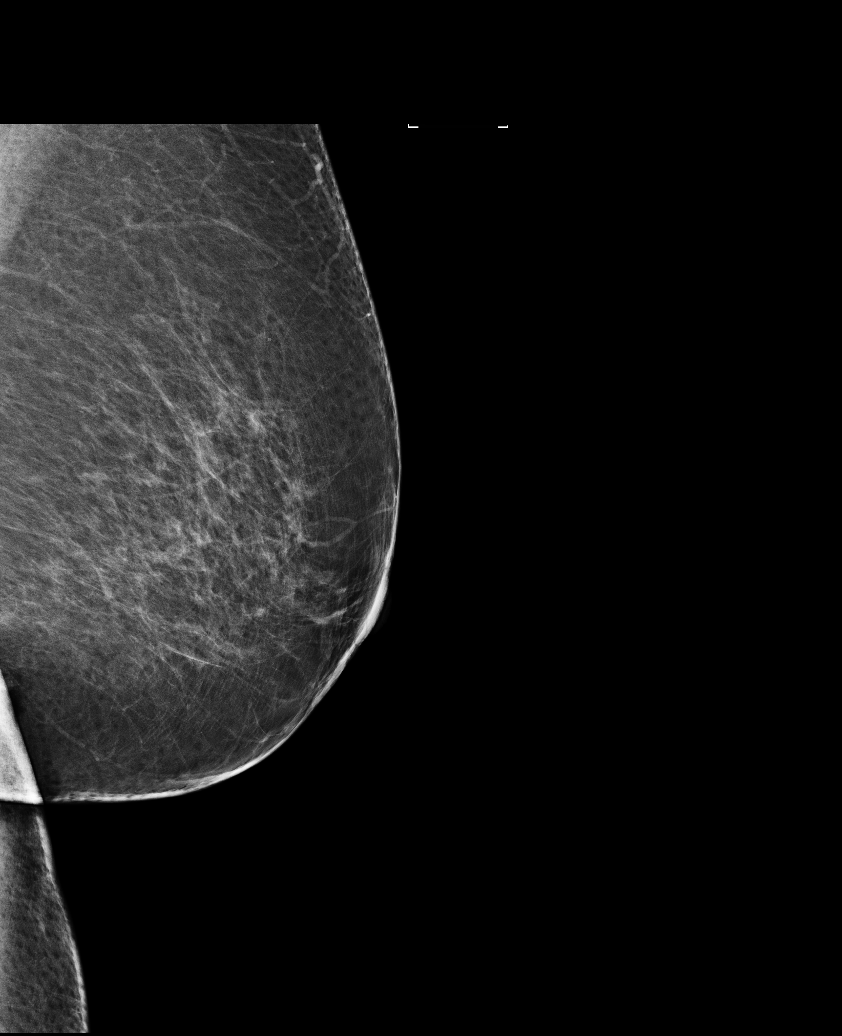

[L CC]
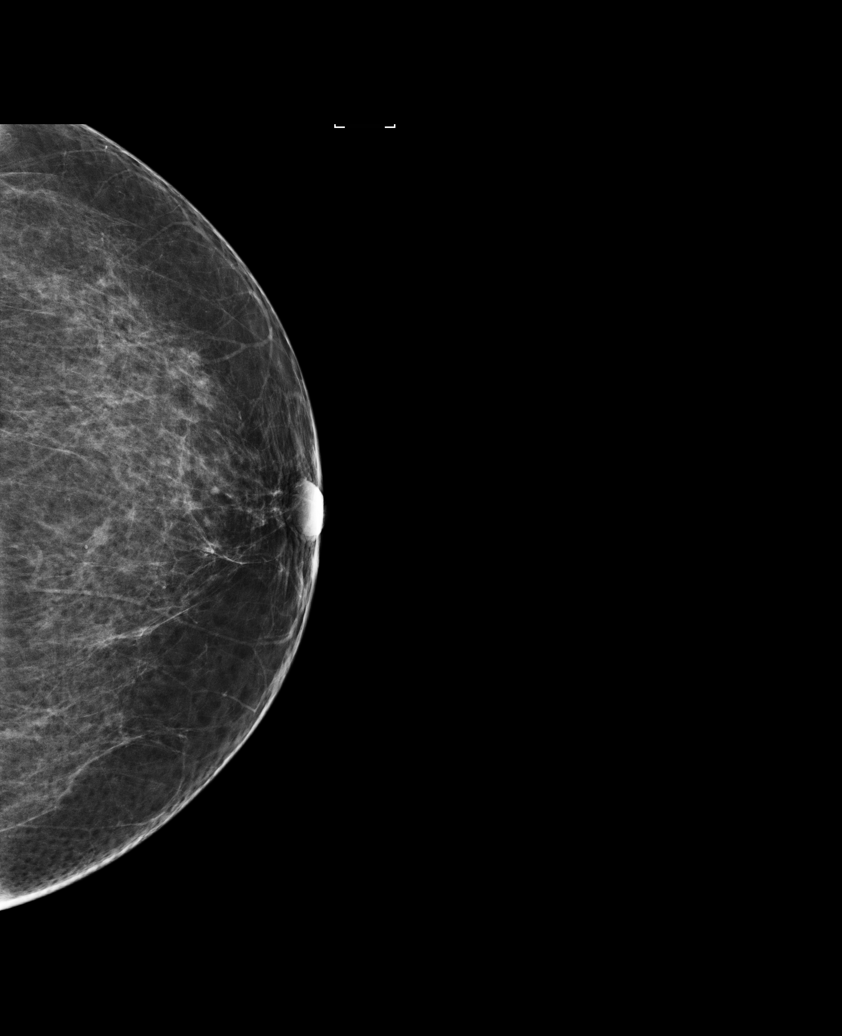

[R CC]
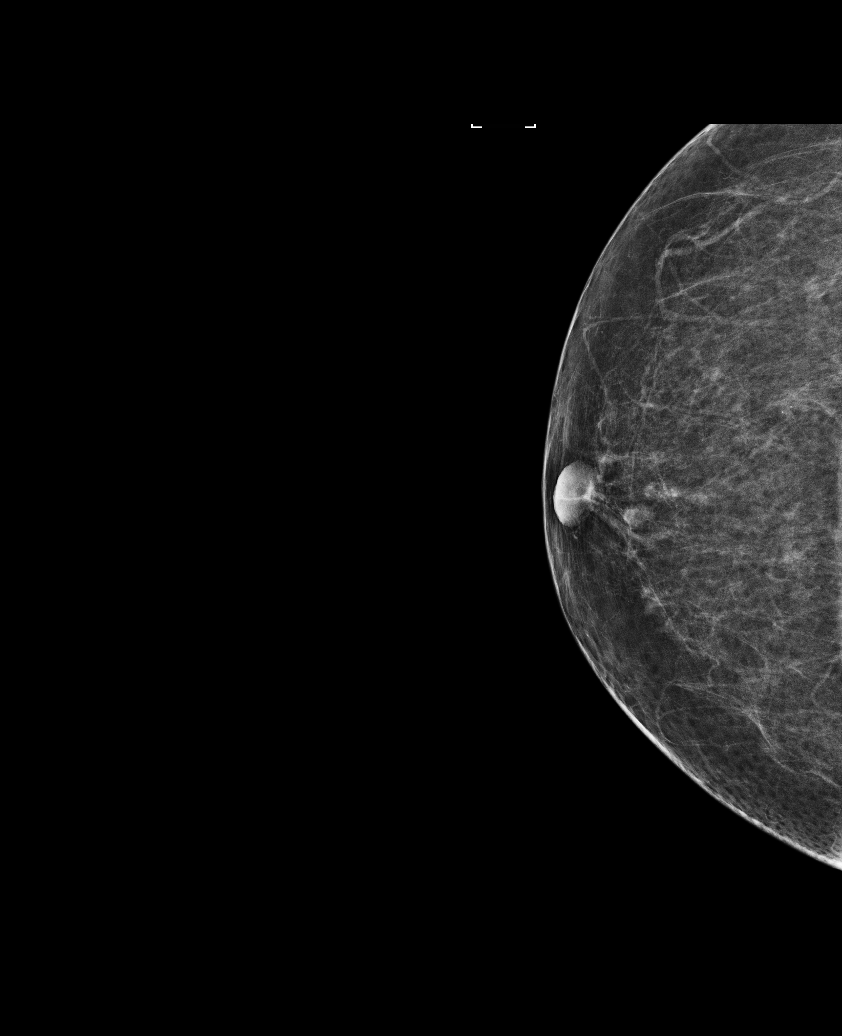

[R MLO (1 of 2)]
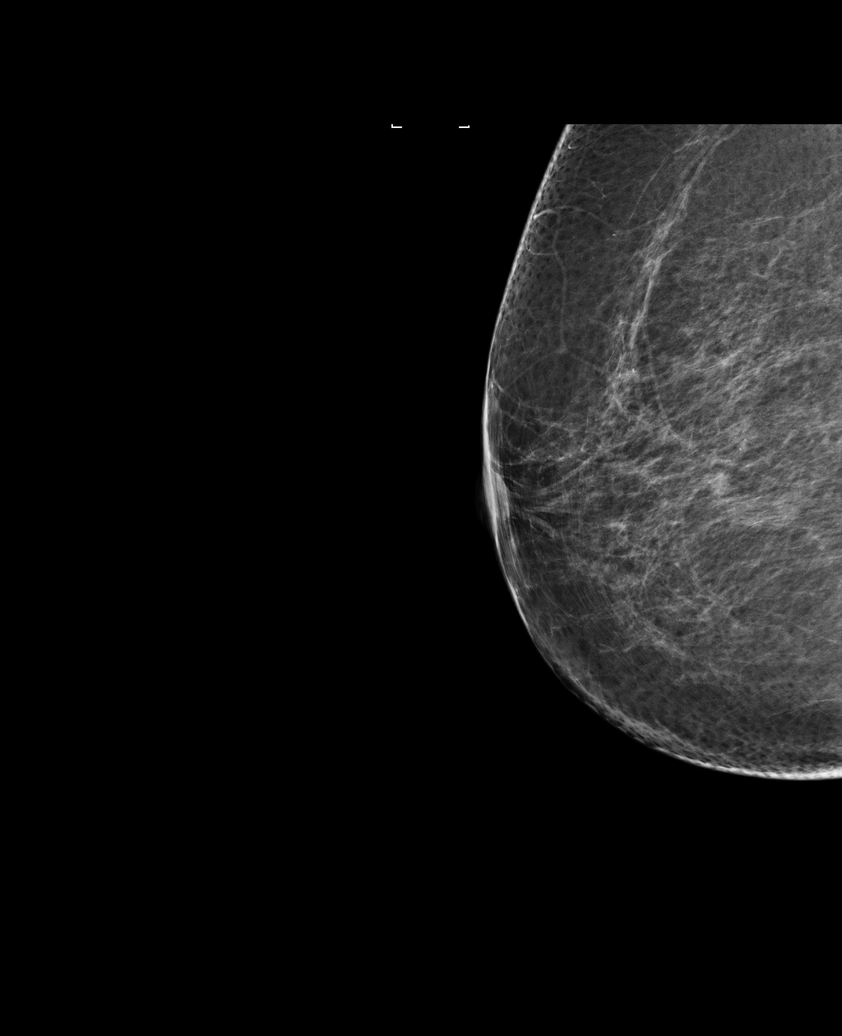

[R MLO (2 of 2)]
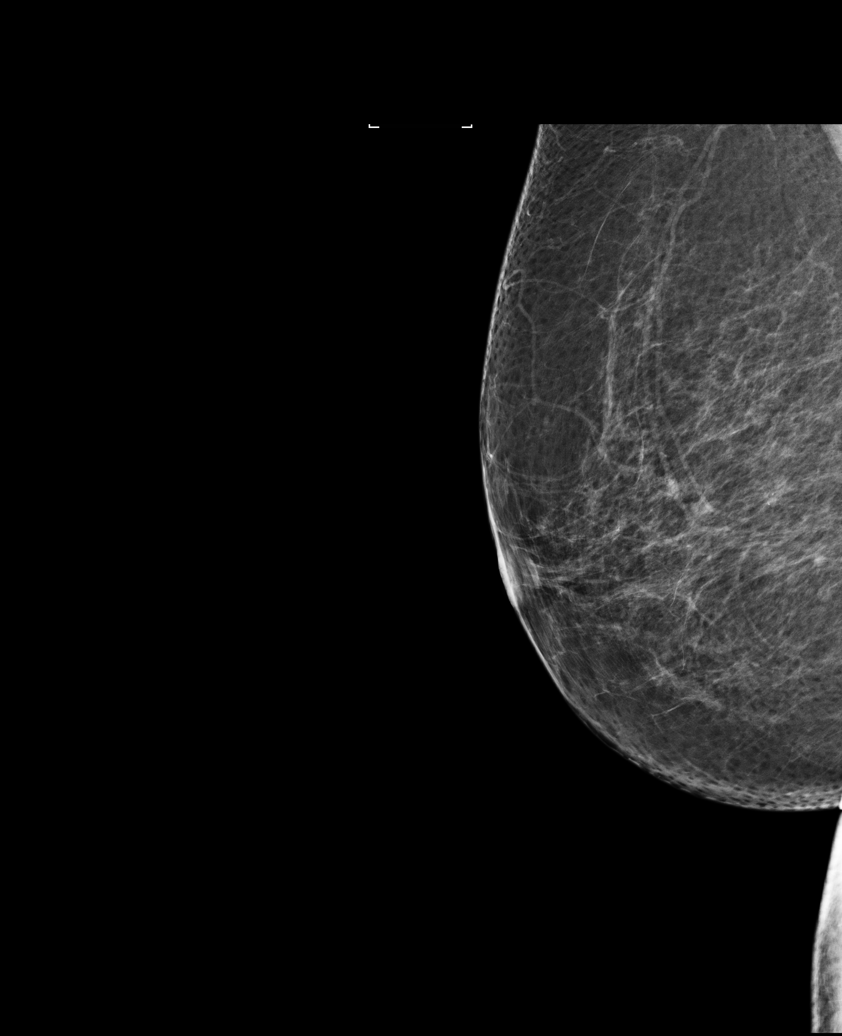

[L MLO (2 of 2)]
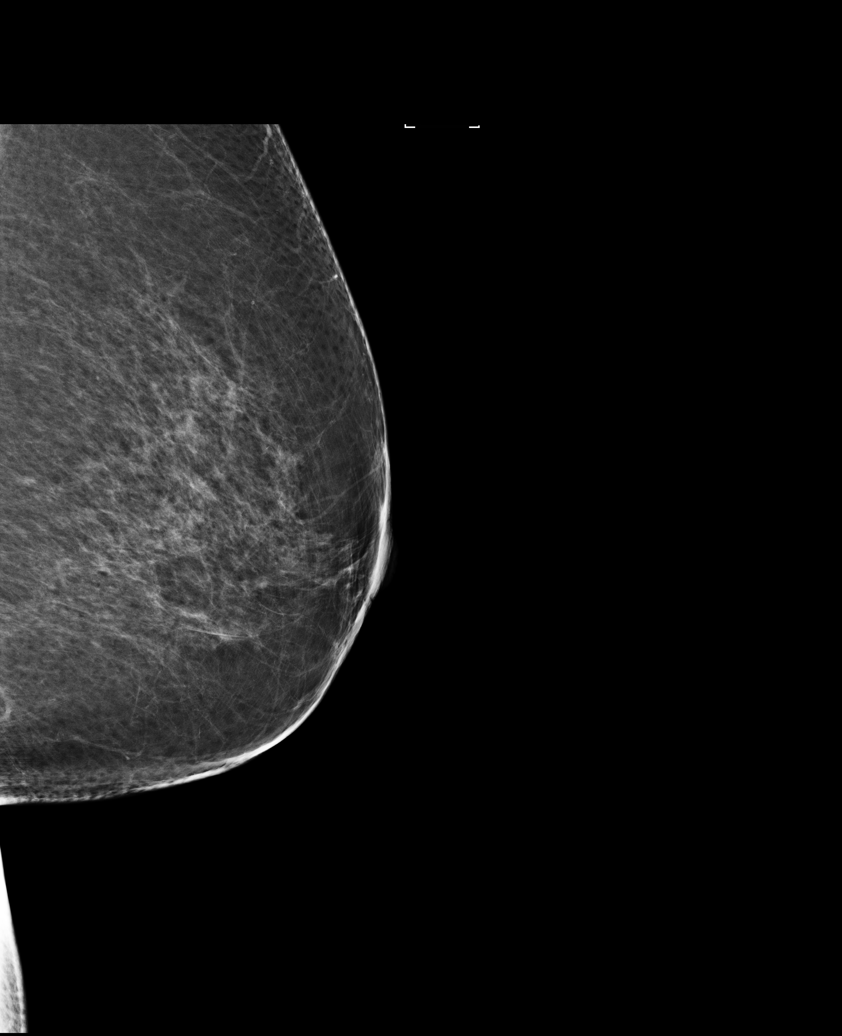

[6 of 6 positions shown; findings below may reference images not displayed]

ACR Breast Density Category b: There are scattered areas of
fibroglandular density.
FINDINGS: There are no findings suspicious for malignancy.
IMPRESSION: No mammographic evidence of malignancy. A result letter of this
screening mammogram will be mailed directly to the patient.

RECOMMENDATION:
Screening mammogram in one year. (Code:[AV])

BI-RADS CATEGORY  1: Negative.

## 2020-04-24 ENCOUNTER — Encounter: Payer: Self-pay | Admitting: Internal Medicine

## 2020-04-24 ENCOUNTER — Ambulatory Visit: Payer: Medicare Other | Admitting: Internal Medicine

## 2020-04-24 VITALS — BP 130/64 | HR 77 | Ht 66.0 in | Wt 195.5 lb

## 2020-04-24 DIAGNOSIS — E1129 Type 2 diabetes mellitus with other diabetic kidney complication: Secondary | ICD-10-CM | POA: Diagnosis not present

## 2020-04-24 DIAGNOSIS — R809 Proteinuria, unspecified: Secondary | ICD-10-CM | POA: Diagnosis not present

## 2020-04-24 DIAGNOSIS — E1165 Type 2 diabetes mellitus with hyperglycemia: Secondary | ICD-10-CM

## 2020-04-24 LAB — POCT GLUCOSE (DEVICE FOR HOME USE): Glucose Fasting, POC: 155 mg/dL — AB (ref 70–99)

## 2020-04-24 NOTE — Patient Instructions (Signed)
-   Continue Metformin 2 tablets twice daily  - Continue Ozempic 0.5 mg Weekly        HOW TO TREAT LOW BLOOD SUGARS (Blood sugar LESS THAN 70 MG/DL)  Please follow the RULE OF 15 for the treatment of hypoglycemia treatment (when your (blood sugars are less than 70 mg/dL)    STEP 1: Take 15 grams of carbohydrates when your blood sugar is low, which includes:   3-4 GLUCOSE TABS  OR  3-4 OZ OF JUICE OR REGULAR SODA OR  ONE TUBE OF GLUCOSE GEL     STEP 2: RECHECK blood sugar in 15 MINUTES STEP 3: If your blood sugar is still low at the 15 minute recheck --> then, go back to STEP 1 and treat AGAIN with another 15 grams of carbohydrates.

## 2020-04-24 NOTE — Progress Notes (Signed)
Name: Caroline White  Age/ Sex: 70 y.o., female   MRN/ DOB: [id number], 19-Apr-1950     PCP: Caroline Arabian, MD   Reason for Endocrinology Evaluation: Type 2 Diabetes Mellitus  Initial Endocrine Consultative Visit: 08/03/2019    PATIENT IDENTIFIER: Caroline White is a 70 y.o. female with a past medical history of T2DM, HTN and Dyslipidemia . The patient has followed with Endocrinology clinic since 08/03/2019 for consultative assistance with management of her diabetes.  DIABETIC HISTORY:  Caroline White was diagnosed with T2DM many years ago, Has been on oral glycemic agents since her diagnosis. No insulin in the past.    On her initial visit to our clinic she had an A1c of 9.5%. She was on metformin and Glimepiride , we increased Glimepiride   Rybelsus started 10/2019 SUBJECTIVE:   During the last visit (11/03/2019): A1c 9.1% . Continued Metformin and increased Glimepiride and started Rybelsus   Today (04/24/2020): Caroline White is here for a follow up on diabetes management.She has not been here in 6 months. She is accompanied by daughter in-law Caroline White.   She checks her blood sugars 0 times a day .The patient is not aware of hypoglycemic episodes as she has not been checking glucose at home.   She endorses episodes of weakness and shaking at night   Pt with chronic diarrhea/loose stools for ~10 yrs, this has not worsened by Ozempic     HOME DIABETES REGIMEN:  Metformin 500 mg, 2 tablets before Breakfast and 2 tablets before Supper  Ozempic 0.5 mg weekly ( Sundays)     Statin: yes ACE-I/ARB: yes    METER DOWNLOAD SUMMARY: Did not bring    DIABETIC COMPLICATIONS: Microvascular complications:    Denies: CKD, retinopathy, neuropathy  Last Eye Exam: Completed 10/2019  Macrovascular complications:    Denies: CAD, CVA, PVD   HISTORY:  Past Medical History:  Past Medical History:  Diagnosis Date  . Diabetes mellitus    type II--no medications  diet  control  . History of kidney stones    "Passed"  . Hyperlipidemia   . Hypertension   . 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 Path confirmed intraductal papilloma   . Nipple discharge    bloody from Lt breast  . Psoriasis    Past Surgical History:  Past Surgical History:  Procedure Laterality Date  . ABDOMINAL HYSTERECTOMY  1998   full  . BREAST DUCTAL SYSTEM EXCISION  06/18/2011   Procedure: EXCISION DUCTAL SYSTEM BREAST;  Surgeon: Haywood Lasso, MD;  Location: [facility];  Service: General;  Laterality: Left;  Left Breast Ductal Excision  . BREAST EXCISIONAL BIOPSY Left   . COLONOSCOPY  11/19/2010   Mild pancolonic diverticuloisis. Small internal hemorrhoids. Otherwise normal colonoscopy to terminal ileum  . nasal surgery 1965    . TONSILLECTOMY AND ADENOIDECTOMY  1958  . TUBAL LIGATION      Social History:  reports that she has never smoked. She has never used smokeless tobacco. She reports that she does not drink alcohol and does not use drugs. Family History:  Family History  Problem Relation Age of Onset  . Cancer Mother        uterine and stomach  . Stroke Father   . Cancer Maternal Grandmother        breast  . Breast cancer Maternal Grandmother      HOME MEDICATIONS: Allergies as of 04/24/2020      Reactions   Fish Oil  Heart racing   Sulfa Drugs Cross Reactors Other (See Comments)   Unknown   Zestril [lisinopril] Cough      Medication List       Accurate as of April 24, 2020  1:59 PM. If you have any questions, ask your nurse or doctor.        amLODipine 10 MG tablet Commonly known as: NORVASC Take 10 mg by mouth daily.   glimepiride 4 MG tablet Commonly known as: AMARYL Take 1 tablet (4 mg total) by mouth daily before breakfast.   metFORMIN 500 MG 24 hr tablet Commonly known as: GLUCOPHAGE-XR Take 2 tablets (1,000 mg total) by mouth 2 (two) times daily. 2 tabs 2 times daily   metoprolol succinate 25 MG 24 hr  tablet Commonly known as: TOPROL-XL Take 50 mg by mouth daily.   multivitamin capsule Take 1 capsule by mouth daily.   ONE TOUCH ULTRA TEST test strip Generic drug: glucose blood 2 (two) times a week.   Ozempic (0.25 or 0.5 MG/DOSE) 2 MG/1.5ML Sopn Generic drug: Semaglutide(0.25 or 0.5MG /DOS) inject 0.5 mg weekly What changed: Another medication with the same name was removed. Continue taking this medication, and follow the directions you see here. Changed by: Dorita Sciara, MD   PSORIASIN EX Apply 1 application topically daily. For psoriasis  --over the counter   rosuvastatin 40 MG tablet Commonly known as: CRESTOR Take 40 mg by mouth daily.        OBJECTIVE:   Vital Signs: BP 130/64   Pulse 77   Ht 5\' 6"  (1.676 m)   Wt 195 lb 8 oz (88.7 kg)   SpO2 98%   BMI 31.55 kg/m   Wt Readings from Last 3 Encounters:  04/24/20 195 lb 8 oz (88.7 kg)  03/29/20 206 lb (93.4 kg)  11/03/19 219 lb 6.4 oz (99.5 kg)     Exam: General: Pt appears well and is in NAD  Lungs: Clear with good BS bilat     Heart: RRR   Extremities: No pretibial edema.  Neuro: Pt with difficulty following commands     DM foot exam: 04/24/2020 The skin of the feet is intact without sores or ulcerations. The pedal pulses are 2+ on right and 2+ on left. The sensation is intact to a screening 5.07, 10 gram monofilament bilaterally   DATA REVIEWED:   Results for Caroline, MCCAREY White "PAT" (MRN [id number]) as of 04/24/2020 13:50  Ref. Range 04/03/2020 12:22  Sodium Latest Ref Range: 135 - 145 mEq/L 137  Potassium Latest Ref Range: 3.5 - 5.1 mEq/L 3.8  Chloride Latest Ref Range: 96 - 112 mEq/L 100  CO2 Latest Ref Range: 19 - 32 mEq/L 28  Glucose Latest Ref Range: 70 - 99 mg/dL 96  BUN Latest Ref Range: 6 - 23 mg/dL 15  Creatinine Latest Ref Range: 0.40 - 1.20 mg/dL 0.99  Calcium Latest Ref Range: 8.4 - 10.5 mg/dL 9.8  Alkaline Phosphatase Latest Ref Range: 39 - 117 U/L 59  Albumin  Latest Ref Range: 3.5 - 5.2 g/dL 4.7  Lipase Latest Ref Range: 11.0 - 59.0 U/L 25.0  AST Latest Ref Range: 0 - 37 U/L 16  ALT Latest Ref Range: 0 - 35 U/L 17  Total Protein Latest Ref Range: 6.0 - 8.3 g/dL 7.6  Total Bilirubin Latest Ref Range: 0.2 - 1.2 mg/dL 0.8  GFR Latest Ref Range: >60.00 mL/min 58.33 (L)  C-Reactive Protein, Cardiac Latest Ref Range: 0.000 - 5.000 mg/L 13.280 (H)  WBC Latest Ref Range: 4.0 - 10.5 K/uL 8.8  RBC Latest Ref Range: 3.87 - 5.11 Mil/uL 4.13  Hemoglobin Latest Ref Range: 12.0 - 15.0 g/dL 12.4  HCT Latest Ref Range: 36.0 - 46.0 % 36.4  MCV Latest Ref Range: 78.0 - 100.0 fl 88.2  MCHC Latest Ref Range: 30.0 - 36.0 g/dL 34.2  RDW Latest Ref Range: 11.5 - 15.5 % 15.5  Platelets Latest Ref Range: 150.0 - 400.0 K/uL 223.0  Hemoglobin A1C Latest Ref Range: 4.6 - 6.5 % 5.3    ASSESSMENT / PLAN / RECOMMENDATIONS:   1) Type 2 Diabetes Mellitus, Optimally  controlled, With microalbuminuria complications - Most recent A1c of 5.3 %. Goal A1c < 7.0 %.     - Agree with PCP in stopping Glimepiride due to risk of hypoglycemia  - The pt has been noted with behavioral changes since the summer, pending CT scan. The pt did have dififultly following commands today  - Family is trying to help with diabetes care. Caroline White was shown how to use glucose meter   MEDICATIONS: - Continue Metformin 2 tablets twice daily  - Continue Ozempic 0.5 mg weekly     EDUCATION / INSTRUCTIONS:  BG monitoring instructions: Patient is instructed to check her blood sugars 3 times a week  Call [facility] if: BG persistently < 70  . I reviewed the Rule of 15 for the treatment of hypoglycemia in detail with the patient. Literature supplied.    2) Diabetic complications:   Eye: Does not have known diabetic retinopathy.   Neuro/ Feet: Does not have known diabetic peripheral neuropathy .   Renal: Patient does not have known baseline CKD. She   is  on an ACEI/ARB at  present    F/U in 3 months    Signed electronically by: Mack Guise, MD  [facility] End[facility]  [facility] [place], [place] [address] Phone: [phone] FAX: [phone]   CC: Caroline Arabian, MD [address] [place] [address] Phone: [phone]  Fax: [phone]  Return to Endocrinology clinic as below: Future Appointments  Date Time Provider [place]  05/07/2020 10:30 AM Cameron Sprang, MD [facility] None

## 2020-04-25 DIAGNOSIS — R809 Proteinuria, unspecified: Secondary | ICD-10-CM | POA: Insufficient documentation

## 2020-04-25 DIAGNOSIS — E1129 Type 2 diabetes mellitus with other diabetic kidney complication: Secondary | ICD-10-CM | POA: Insufficient documentation

## 2020-04-25 MED ORDER — OZEMPIC (0.25 OR 0.5 MG/DOSE) 2 MG/1.5ML ~~LOC~~ SOPN: 0.5000 mg | PEN_INJECTOR | SUBCUTANEOUS | 11 refills | Status: DC

## 2020-05-03 ENCOUNTER — Telehealth: Payer: Self-pay | Admitting: Gastroenterology

## 2020-05-03 NOTE — Telephone Encounter (Signed)
Pt's daughter in law Dazani Norby is requesting a call back from a nurse, caller states the pt is not able to complete the stool test that was ordered.

## 2020-05-06 NOTE — Telephone Encounter (Signed)
Spoke to patients daughter who states that patient was not able to do the stool testing.

## 2020-05-07 ENCOUNTER — Encounter: Payer: Self-pay | Admitting: Neurology

## 2020-05-07 ENCOUNTER — Other Ambulatory Visit (INDEPENDENT_AMBULATORY_CARE_PROVIDER_SITE_OTHER): Payer: Medicare Other

## 2020-05-07 ENCOUNTER — Ambulatory Visit: Payer: Medicare Other | Admitting: Neurology

## 2020-05-07 ENCOUNTER — Other Ambulatory Visit: Payer: Self-pay

## 2020-05-07 VITALS — BP 120/75 | HR 79 | Ht 66.0 in | Wt 193.2 lb

## 2020-05-07 DIAGNOSIS — F0390 Unspecified dementia without behavioral disturbance: Secondary | ICD-10-CM

## 2020-05-07 LAB — TSH: TSH: 4.33 u[IU]/mL (ref 0.35–4.50)

## 2020-05-07 LAB — AMMONIA: Ammonia: 21 umol/L (ref 11–35)

## 2020-05-07 LAB — VITAMIN B12: Vitamin B-12: 205 pg/mL — ABNORMAL LOW (ref 211–911)

## 2020-05-07 LAB — C-REACTIVE PROTEIN: CRP: <1 mg/dL (ref 0.5–20.0)

## 2020-05-07 LAB — SEDIMENTATION RATE: Sed Rate: 20 mm/hr (ref 0–30)

## 2020-05-07 MED ORDER — MEMANTINE HCL 10 MG PO TABS: ORAL_TABLET | ORAL | 11 refills | Status: DC
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
## 2020-05-07 NOTE — Patient Instructions (Addendum)
1. Schedule MRI brain with and without contrast. We have sent a referral to [facility] for your MRI and they will call you directly to schedule your appointment. They are located at [place]. If you need to contact them directly please call [phone].   2. Schedule EEG  3. Bloodwork for B12, RPR, ammonia, ESR, CRP, ANA.Your provider has requested that you have labwork completed today. Please go to [facility] ([id number]) on the second floor of this building before leaving the office today. You do not need to check in. If you are not called within 15 minutes please check with the front desk.   4. Start Memantine (Namenda) 98m: take 1 tablet every night for 2 weeks, then increase to 1 tablet twice a day  5. Continue close supervision, no further driving  6. Follow-up after tests, call for any changes   FALL PRECAUTIONS: Be cautious when walking. Scan the area for obstacles that may increase the risk of trips and falls. When getting up in the mornings, sit up at the edge of the bed for a few minutes before getting out of bed. Consider elevating the bed at the head end to avoid drop of blood pressure when getting up. Walk always in a well-lit room (use night lights in the walls). Avoid area rugs or power cords from appliances in the middle of the walkways. Use a walker or a cane if necessary and consider physical therapy for balance exercise. Get your eyesight checked regularly.  HOME SAFETY: Consider the safety of the kitchen when operating appliances like stoves, microwave oven, and blender. Consider having supervision and share cooking responsibilities until no longer able to participate in those. Accidents with firearms and other hazards in the house should be identified and addressed as well.  ABILITY TO BE LEFT ALONE: If patient is unable to contact 911 operator, consider using LifeLine, or when the need is there, arrange for someone to stay with patients.  Smoking is a fire hazard, consider supervision or cessation. Risk of wandering should be assessed by caregiver and if detected at any point, supervision and safe proof recommendations should be instituted.  RECOMMENDATIONS FOR ALL PATIENTS WITH MEMORY PROBLEMS: 1. Continue to exercise (Recommend 30 minutes of walking everyday, or 3 hours every week) 2. Increase social interactions - [place] with friends and family 3. Eat healthy, avoid fried foods and eat more fruits and vegetables 4. Maintain adequate blood pressure, blood sugar, and blood cholesterol level. Reducing the risk of stroke and cardiovascular disease also helps promoting better memory. 5. Avoid stressful situations. Live a simple life and avoid aggravations. Organize your time and prepare for the next day in anticipation. 6. Sleep well, avoid any interruptions of sleep and avoid any distractions in the bedroom that may interfere with adequate sleep quality 7. Avoid sugar, avoid sweets as there is a strong link between excessive sugar intake, diabetes, and cognitive impairment The Mediterranean diet has been shown to help patients reduce the risk of progressive memory disorders and reduces cardiovascular risk. This includes eating fish, eat fruits and green leafy vegetables, nuts like almonds and hazelnuts, walnuts, and also use olive oil. Avoid fast foods and fried foods as much as possible. Avoid sweets and sugar as sugar use has been linked to worsening of memory function.  There is always a concern of gradual progression of memory problems. If this is the case, then we may need to adjust level of  care according to patient needs. Support, both to the patient and caregiver, should then be put into place.

## 2020-05-09 LAB — ANA: Anti Nuclear Antibody (ANA): NEGATIVE

## 2020-05-09 LAB — RPR: RPR Ser Ql: NONREACTIVE

## 2020-05-10 LAB — TROPHERYMA WHIPPLEI, DNA, PCR: T. whipplei: NOT DETECTED

## 2020-05-14 ENCOUNTER — Telehealth: Payer: Self-pay

## 2020-05-14 MED ORDER — "EASY TOUCH FLIPLOCK SAFETY SYR 21G X 1-1/2"" 3 ML MISC": 0 refills | Status: DC

## 2020-05-14 MED ORDER — SHARPS CONTAINER MISC: 1.0000 | 0 refills | Status: DC | PRN

## 2020-05-14 MED ORDER — B-12 COMPLIANCE INJECTION 1000 MCG/ML IJ KIT: PACK | INTRAMUSCULAR | 0 refills | Status: DC

## 2020-05-14 NOTE — Telephone Encounter (Signed)
-----   Message from Cameron Sprang, MD sent at 05/14/2020 12:54 PM EST ----- Pls let her know majority of bloodwork was okay except for her B12 level. Would recommend B12 injections, patient will need 1000 mcg daily for 1 week, then weekly for 1 month, then monthly for a year. Would it be easier/closer for her to do this with her PCP or would they like [place] to do it? Other option is we teach someone in household to give injections and they do it for her. Thanks

## 2020-05-14 NOTE — Telephone Encounter (Signed)
Pt called spoke with daughter majority of bloodwork was okay except for her B12 level. Would recommend B12 injections, patient will need 1000 mcg daily for 1 week, then weekly for 1 month, then monthly for a year. Would it be easier/closer for her to do this with her PCP or would they like [place] to do it? Other option is we teach someone in household to give injections and they do it for her. Daughter stated that they will come to the office for B12 injection training, Order placed in Epic

## 2020-05-15 ENCOUNTER — Ambulatory Visit: Payer: Medicare Other | Admitting: Neurology

## 2020-05-15 ENCOUNTER — Other Ambulatory Visit: Payer: Self-pay

## 2020-05-15 DIAGNOSIS — F0390 Unspecified dementia without behavioral disturbance: Secondary | ICD-10-CM | POA: Diagnosis not present

## 2020-05-17 ENCOUNTER — Telehealth: Payer: Self-pay

## 2020-05-17 NOTE — Telephone Encounter (Signed)
-----   Message from Cameron Sprang, MD sent at 05/17/2020 12:53 PM EST ----- Pls let patient/dtr know the EEG was normal, proceed with MRI brain as scheduled, thanks

## 2020-05-17 NOTE — Procedures (Signed)
ELECTROENCEPHALOGRAM REPORT  Date of Study: 05/15/2020  Patient's Name: Caroline White MRN: [id number] Date of Birth: 04-21-1950  Referring Provider: Dr. Ellouise Newer  Clinical History: This is a 70 year old woman with rapidly progressive dementia, body jerks.  Medications: NORVASC 10 MG tablet GLUCOPHAGE-XR 500 MG 24 hr tablet TOPROL-XL 25 MG 24 hr tablet MULTIVITAMIN capsule CRESTOR 40 MG tablet OZEMPIC, 0.25 OR 0.5 MG/DOSE 2 MG/1.5ML SOPN DIOVAN-HCT 80-12.5 MG tablet  Technical Summary: A multichannel digital EEG recording measured by the international 10-20 system with electrodes applied with paste and impedances below 5000 ohms performed in our laboratory with EKG monitoring in an awake and asleep patient.  Hyperventilation was not performed. Photic stimulation was performed.  The digital EEG was referentially recorded, reformatted, and digitally filtered in a variety of bipolar and referential montages for optimal display.    Description: The patient is awake and asleep during the recording.  During maximal wakefulness, there is a symmetric, medium voltage 9 Hz posterior dominant rhythm that attenuates with eye opening.  The record is symmetric.  During drowsiness and stage I sleep, there is an increase in theta and delta slowing of the background with occasional vertex waves seen. Photic stimulation did not elicit any abnormalities.  There were no epileptiform discharges or electrographic seizures seen.    EKG lead was unremarkable.  Impression: This awake and asleep EEG is normal.    Clinical Correlation: A normal EEG does not exclude a clinical diagnosis of epilepsy.  If further clinical questions remain, prolonged EEG may be helpful.  Clinical correlation is advised.   Ellouise Newer, M.D.

## 2020-05-17 NOTE — Telephone Encounter (Signed)
Spoke with pt daughter in law the EEG was normal, proceed with MRI brain as scheduled

## 2020-05-20 ENCOUNTER — Telehealth: Payer: Self-pay | Admitting: Neurology

## 2020-05-20 NOTE — Telephone Encounter (Signed)
Patient's daughter [name] called back in and said she does not think she can give the B12 shots due to how large the needles are and her fear of needles. She said they were told there was an option for the patient to come in every day to get a shot. Is that still an option? I have cancelled the in office injection appointment to teach them how to administer the B12 shot.

## 2020-05-20 NOTE — Telephone Encounter (Signed)
[name] patient daughter called and left a VM stating that she was told that once she got the B-12 shot she could bring Pat into the office to be shown how to do the injection, she needs to know when she can do that  Please call

## 2020-05-20 NOTE — Telephone Encounter (Signed)
Called and got her sch for 05-21-20 at 10:45

## 2020-05-21 ENCOUNTER — Ambulatory Visit: Payer: Medicare Other

## 2020-05-27 ENCOUNTER — Other Ambulatory Visit: Payer: Self-pay

## 2020-05-27 ENCOUNTER — Ambulatory Visit (INDEPENDENT_AMBULATORY_CARE_PROVIDER_SITE_OTHER): Payer: Medicare Other

## 2020-05-27 DIAGNOSIS — E538 Deficiency of other specified B group vitamins: Secondary | ICD-10-CM

## 2020-05-27 MED ORDER — CYANOCOBALAMIN 1000 MCG/ML IJ SOLN
1000.0000 ug | Freq: Once | INTRAMUSCULAR | Status: AC
  Administered 2020-05-27: 1000 ug via INTRAMUSCULAR

## 2020-05-28 ENCOUNTER — Ambulatory Visit (INDEPENDENT_AMBULATORY_CARE_PROVIDER_SITE_OTHER): Payer: Medicare Other

## 2020-05-28 ENCOUNTER — Ambulatory Visit
Admission: RE | Admit: 2020-05-28 | Discharge: 2020-05-28 | Disposition: A | Discharge status: Home / Self Care | Payer: Medicare Other | Source: Ambulatory Visit | Attending: Neurology | Admitting: Neurology

## 2020-05-28 ENCOUNTER — Telehealth: Payer: Self-pay | Admitting: Neurology

## 2020-05-28 ENCOUNTER — Other Ambulatory Visit: Payer: Self-pay

## 2020-05-28 DIAGNOSIS — E538 Deficiency of other specified B group vitamins: Secondary | ICD-10-CM

## 2020-05-28 DIAGNOSIS — I639 Cerebral infarction, unspecified: Secondary | ICD-10-CM

## 2020-05-28 DIAGNOSIS — G9389 Other specified disorders of brain: Secondary | ICD-10-CM | POA: Diagnosis not present

## 2020-05-28 DIAGNOSIS — F0390 Unspecified dementia without behavioral disturbance: Secondary | ICD-10-CM

## 2020-05-28 IMAGING — MR MR HEAD WO/W CM
12 series · 48 of 48 positions shown · IV contrast (18ml Multihance)
Comparison: CT CHUN [DATE].
COMPARISON: CT CHUN [DATE].

Addendum:
CLINICAL DATA: Memory loss.

EXAM:
MRI HEAD WITHOUT AND WITH CONTRAST
TECHNIQUE: Multiplanar, multiecho pulse sequences of the brain and surrounding
structures were obtained without and with intravenous contrast.
CONTRAST:  18mL MULTIHANCE GADOBENATE DIMEGLUMINE 529 MG/ML IV SOLN

[Series 2: T1 · sagittal · 5.0mm · 0.45mm/px · 3 of 26 slices shown]
[im 1/26]
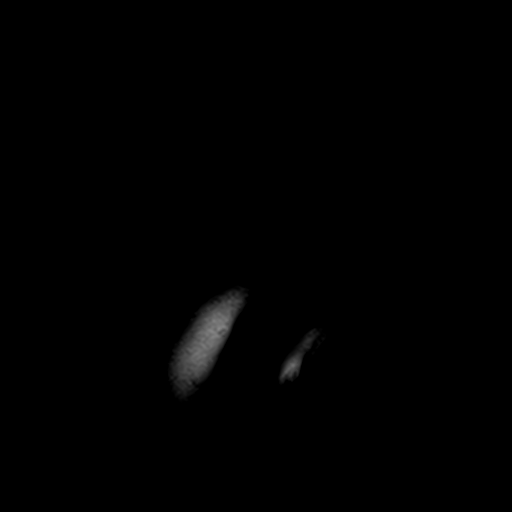
[im 13/26]
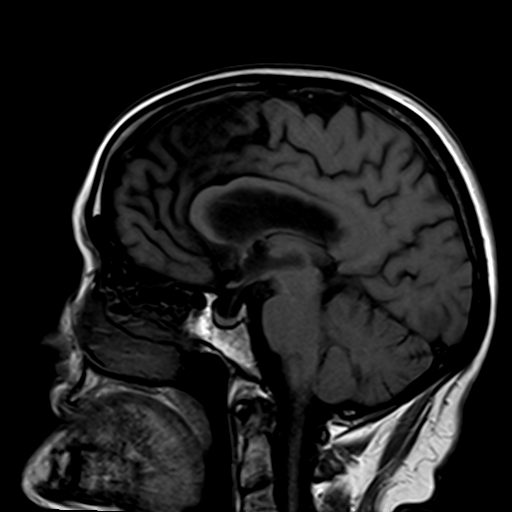
[im 26/26]
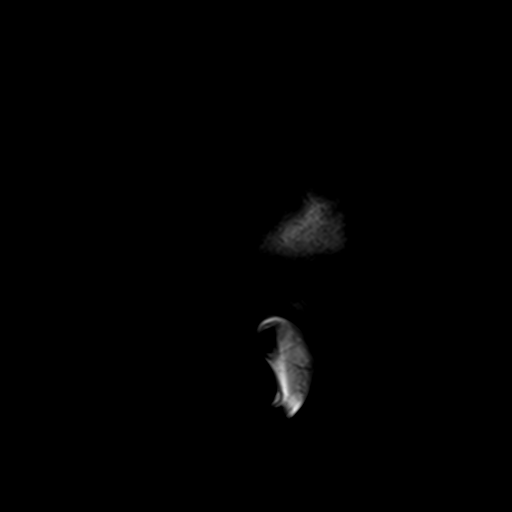

[Series 3: DWI · axial · 3.0mm · 1.80mm/px · z∈[-54,+102]mm · 7 of 106 slices shown (1 of 4)]
[im 1/106]
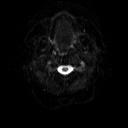
[im 18/106]
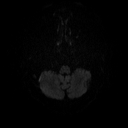
[im 36/106]
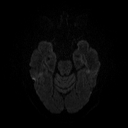
[im 53/106]
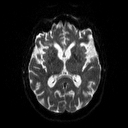
[im 71/106]
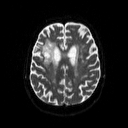
[im 88/106]
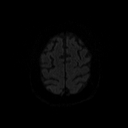
[im 106/106]
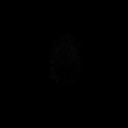

[Series 4: DWI · axial · 3.0mm · 1.80mm/px · z∈[-54,+102]mm · 3 of 53 slices shown (2 of 4)]
[im 1/53]
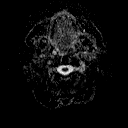
[im 27/53]
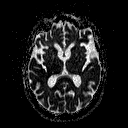
[im 53/53]
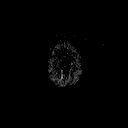

[Series 5: DWI · coronal · 5.0mm · 1.80mm/px · 5 of 74 slices shown (3 of 4)]
[im 1/74]
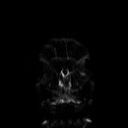
[im 19/74]
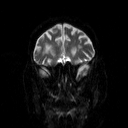
[im 37/74]
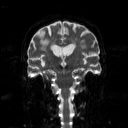
[im 55/74]
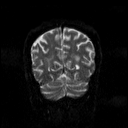
[im 74/74]
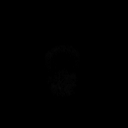

[Series 6: DWI · coronal · 5.0mm · 1.80mm/px · 2 of 37 slices shown (4 of 4)]
[im 1/37]
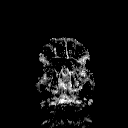
[im 37/37]
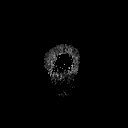

[Series 7: T2 · axial · 5.0mm · 0.60mm/px · z∈[-56,+105]mm · 2 of 25 slices shown (1 of 2)]
[im 1/25]
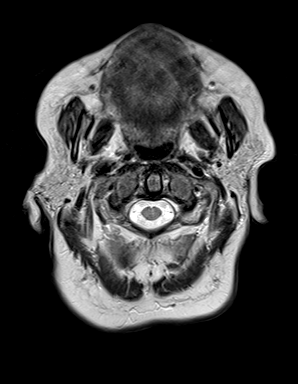
[im 25/25]
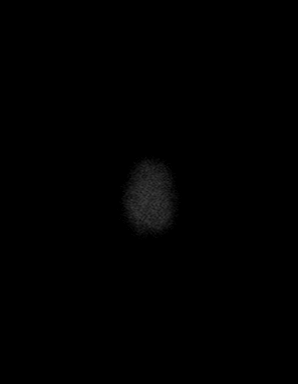

[Series 8: FLAIR · axial · 3.0mm · 0.45mm/px · z∈[-53,+100]mm · 2 of 34 slices shown]
[im 1/34]
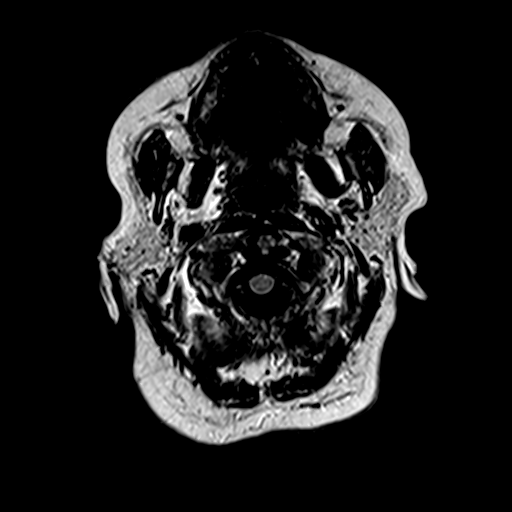
[im 34/34]
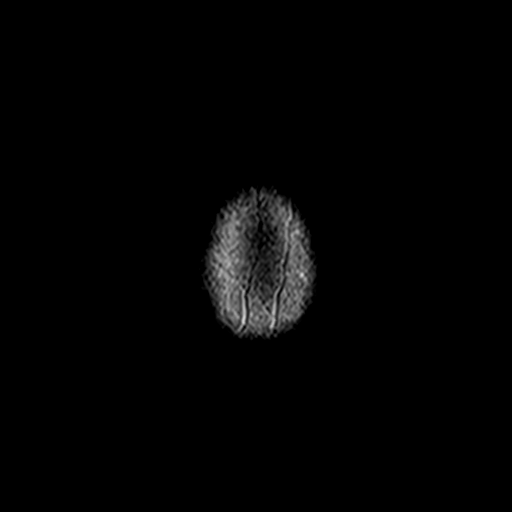

[Series 10: swi_images · axial · 4.0mm · 0.90mm/px · z∈[-46,+94]mm · 2 of 36 slices shown]
[im 1/36]
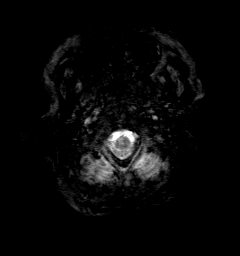
[im 36/36]
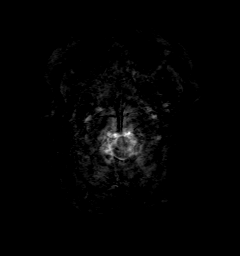

[Series 11: t1_mpr_tra · axial · 1.1mm · 0.75mm/px · z∈[-55,+103]mm · 9 of 144 slices shown]
[im 1/144]
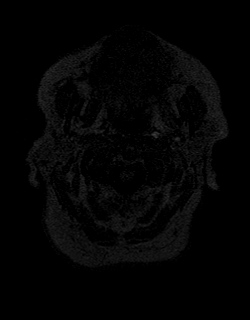
[im 18/144]
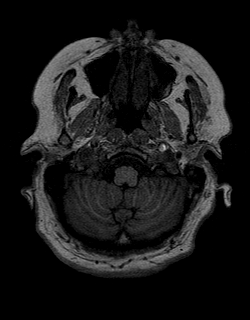
[im 36/144]
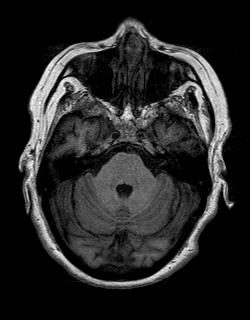
[im 54/144]
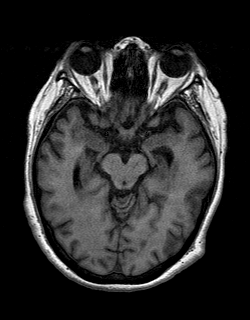
[im 72/144]
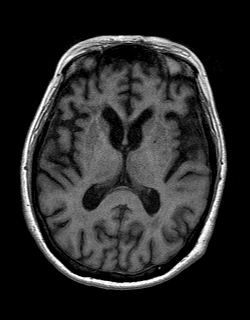
[im 90/144]
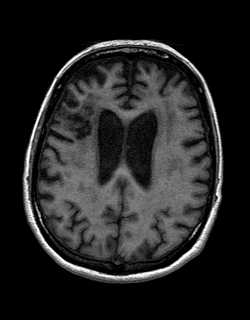
[im 108/144]
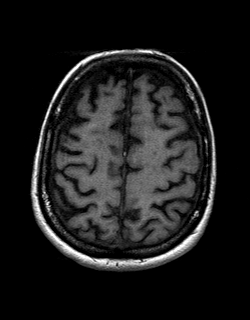
[im 126/144]
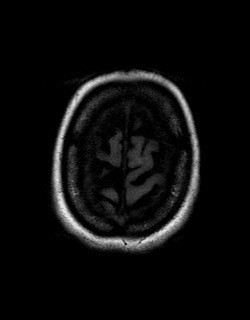
[im 144/144]
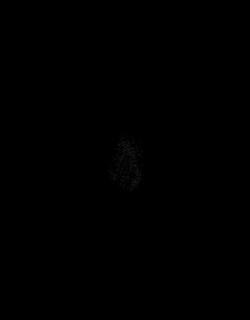

[Series 12: T2 · coronal · 5.0mm · 0.45mm/px · 2 of 29 slices shown (2 of 2)]
[im 1/29]
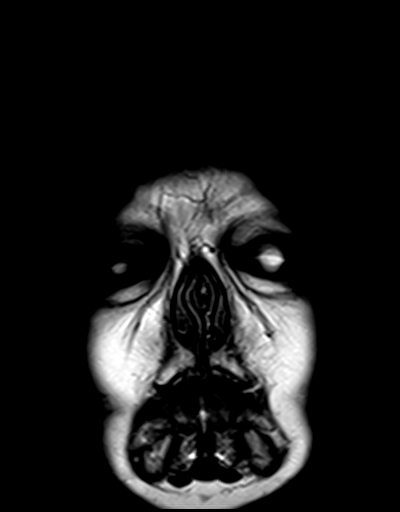
[im 29/29]
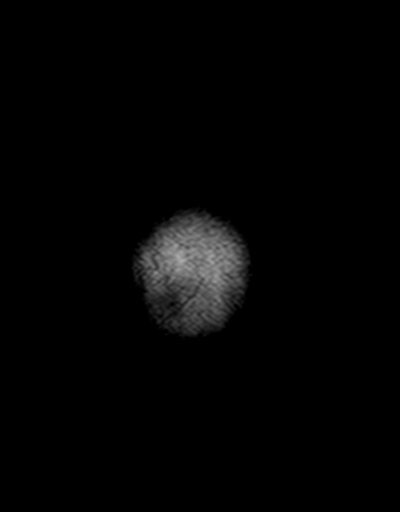

[Series 13: t1_mpr_tra post · axial · 1.1mm · 0.75mm/px · z∈[-55,+103]mm · 9 of 144 slices shown]
[im 1/144]
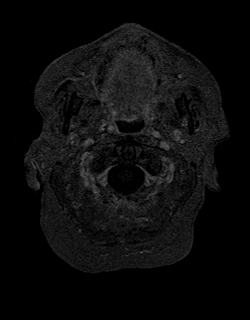
[im 18/144]
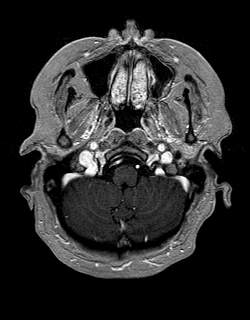
[im 36/144]
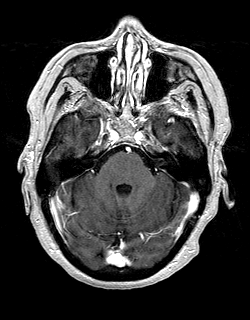
[im 54/144]
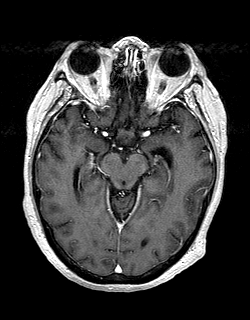
[im 72/144]
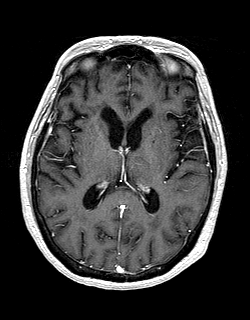
[im 90/144]
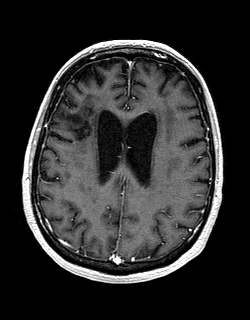
[im 108/144]
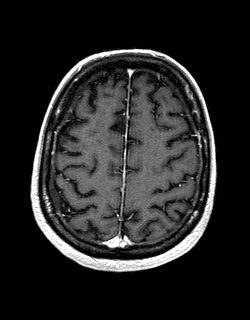
[im 126/144]
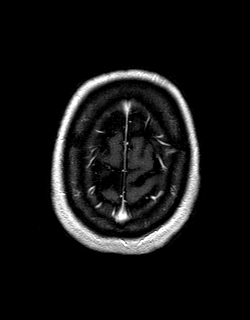
[im 144/144]
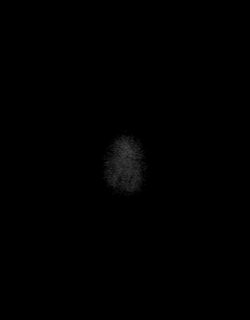

[Series 14: post cor · coronal · 5.0mm · 0.45mm/px · 2 of 29 slices shown]
[im 1/29]
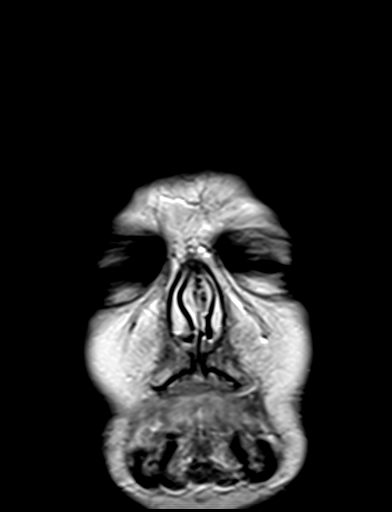
[im 29/29]
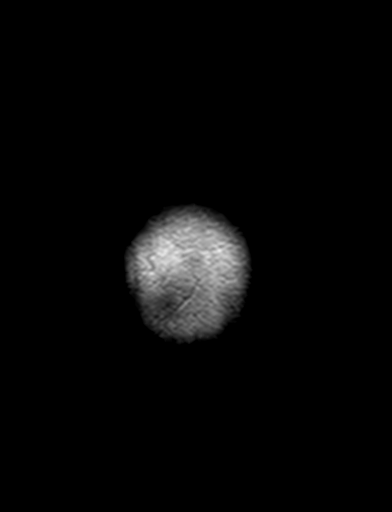

[48 of 48 positions shown; findings below may reference images not displayed]

FINDINGS: Brain: Right frontal cortical and subcortical encephalomalacia (new
since [DATE]) with multiple areas of surrounding
restricted diffusion, compatible with acute or early subacute
peri-infarct ischemia. Associated gliosis and/or edema without
substantial mass effect. Additional advanced T2/FLAIR
hyperintensities in the periventricular subcortical white matter,
most likely related to chronic microvascular ischemic disease. No
acute hemorrhage. Generalized cerebral volume loss with ex vacuo
ventricular dilation. No mass lesion or midline shift. Basal
cisterns are patent. No extra-axial fluid collection. No abnormal
enhancement.

Vascular: Major arterial flow voids are maintained at the skull
base.

Skull and upper cervical spine: Normal marrow signal.

Sinuses/Orbits: Mild mucosal thickening of scattered ethmoid air
cells in the inferior left maxillary sinus. No air-fluid levels.
Unremarkable orbits.

Other: Small bilateral mastoid effusions.
IMPRESSION: 1. Right frontal encephalomalacia (new since [DATE]) with
multiple areas of surrounding restricted diffusion, compatible with
acute or early subacute peri-infarct ischemia. Associated gliosis
and/or edema without substantial mass effect.
2. Advanced chronic microvascular ischemic disease and generalized
cerebral volume loss.

ADDENDUM:
Findings discussed with Dr. CHUN via telephone at [DATE] p.m.

*** End of Addendum ***
FINDINGS: Brain: Right frontal cortical and subcortical encephalomalacia (new
since [DATE]) with multiple areas of surrounding
restricted diffusion, compatible with acute or early subacute
peri-infarct ischemia. Associated gliosis and/or edema without
substantial mass effect. Additional advanced T2/FLAIR
hyperintensities in the periventricular subcortical white matter,
most likely related to chronic microvascular ischemic disease. No
acute hemorrhage. Generalized cerebral volume loss with ex vacuo
ventricular dilation. No mass lesion or midline shift. Basal
cisterns are patent. No extra-axial fluid collection. No abnormal
enhancement.

Vascular: Major arterial flow voids are maintained at the skull
base.

Skull and upper cervical spine: Normal marrow signal.

Sinuses/Orbits: Mild mucosal thickening of scattered ethmoid air
cells in the inferior left maxillary sinus. No air-fluid levels.
Unremarkable orbits.

Other: Small bilateral mastoid effusions.
IMPRESSION: 1. Right frontal encephalomalacia (new since [DATE]) with
multiple areas of surrounding restricted diffusion, compatible with
acute or early subacute peri-infarct ischemia. Associated gliosis
and/or edema without substantial mass effect.
2. Advanced chronic microvascular ischemic disease and generalized
cerebral volume loss.

## 2020-05-28 MED ORDER — GADOBENATE DIMEGLUMINE 529 MG/ML IV SOLN
18.0000 mL | Freq: Once | INTRAVENOUS | Status: AC | PRN
  Administered 2020-05-28: 18 mL via INTRAVENOUS

## 2020-05-28 MED ORDER — CYANOCOBALAMIN 1000 MCG/ML IJ SOLN
1000.0000 ug | Freq: Once | INTRAMUSCULAR | Status: AC
  Administered 2020-05-28: 1000 ug via INTRAMUSCULAR

## 2020-05-28 NOTE — Progress Notes (Signed)
Pt came in today for b12 injection was given in right deltoid. Pt tolerated well,

## 2020-05-28 NOTE — Telephone Encounter (Signed)
Received call from Radiology re: brain MRI showing Right frontal cortical and subcortical encephalomalacia (new since February 07, 2020) with multiple areas of surrounding restricted diffusion, compatible with acute or early subacute peri-infarct ischemia. Spoke to son about results, discussed doing stroke workup with CTA head and neck with and without contrast, echocardiogram. Start daily aspirin 81mg , bloodwork from PCP will be requested for review, goal LDL <100. Son informed that for any sudden changes in symptoms, go to ER immediately, he expressed understanding.

## 2020-05-29 ENCOUNTER — Ambulatory Visit (INDEPENDENT_AMBULATORY_CARE_PROVIDER_SITE_OTHER): Payer: Medicare Other

## 2020-05-29 ENCOUNTER — Other Ambulatory Visit: Payer: Self-pay

## 2020-05-29 DIAGNOSIS — E538 Deficiency of other specified B group vitamins: Secondary | ICD-10-CM

## 2020-05-29 MED ORDER — CYANOCOBALAMIN 1000 MCG/ML IJ SOLN
1000.0000 ug | Freq: Once | INTRAMUSCULAR | Status: AC
  Administered 2020-05-29: 1000 ug via INTRAMUSCULAR

## 2020-05-29 NOTE — Telephone Encounter (Signed)
Orders placed in epic

## 2020-05-30 ENCOUNTER — Ambulatory Visit (INDEPENDENT_AMBULATORY_CARE_PROVIDER_SITE_OTHER): Payer: Medicare Other

## 2020-05-30 DIAGNOSIS — E538 Deficiency of other specified B group vitamins: Secondary | ICD-10-CM

## 2020-05-30 MED ORDER — CYANOCOBALAMIN 1000 MCG/ML IJ SOLN
1000.0000 ug | Freq: Once | INTRAMUSCULAR | Status: AC
  Administered 2020-05-30: 1000 ug via INTRAMUSCULAR

## 2020-05-30 NOTE — Progress Notes (Signed)
Pt came in today for B12 injection she tolerated it well no complaints, injection given in right deltoid,

## 2020-05-31 ENCOUNTER — Other Ambulatory Visit: Payer: Self-pay

## 2020-05-31 ENCOUNTER — Ambulatory Visit (INDEPENDENT_AMBULATORY_CARE_PROVIDER_SITE_OTHER): Payer: Medicare Other

## 2020-05-31 DIAGNOSIS — E538 Deficiency of other specified B group vitamins: Secondary | ICD-10-CM

## 2020-05-31 MED ORDER — CYANOCOBALAMIN 1000 MCG/ML IJ SOLN
1000.0000 ug | Freq: Once | INTRAMUSCULAR | Status: AC
  Administered 2020-05-31: 1000 ug via INTRAMUSCULAR

## 2020-06-02 ENCOUNTER — Other Ambulatory Visit: Payer: Self-pay | Admitting: Internal Medicine

## 2020-06-03 ENCOUNTER — Other Ambulatory Visit: Payer: Self-pay

## 2020-06-03 ENCOUNTER — Ambulatory Visit (INDEPENDENT_AMBULATORY_CARE_PROVIDER_SITE_OTHER): Payer: Medicare Other

## 2020-06-03 DIAGNOSIS — E538 Deficiency of other specified B group vitamins: Secondary | ICD-10-CM | POA: Diagnosis not present

## 2020-06-03 MED ORDER — CYANOCOBALAMIN 1000 MCG/ML IJ SOLN
1000.0000 ug | Freq: Once | INTRAMUSCULAR | Status: AC
  Administered 2020-06-03: 1000 ug via INTRAMUSCULAR

## 2020-06-03 NOTE — Progress Notes (Signed)
b12 injection given  NDC# [phone]

## 2020-06-04 ENCOUNTER — Ambulatory Visit (INDEPENDENT_AMBULATORY_CARE_PROVIDER_SITE_OTHER): Payer: Medicare Other

## 2020-06-04 ENCOUNTER — Other Ambulatory Visit (INDEPENDENT_AMBULATORY_CARE_PROVIDER_SITE_OTHER): Payer: Medicare Other

## 2020-06-04 DIAGNOSIS — E538 Deficiency of other specified B group vitamins: Secondary | ICD-10-CM | POA: Diagnosis not present

## 2020-06-04 MED ORDER — CYANOCOBALAMIN 1000 MCG/ML IJ SOLN
1000.0000 ug | Freq: Once | INTRAMUSCULAR | Status: AC
  Administered 2020-06-04: 1000 ug via INTRAMUSCULAR

## 2020-06-07 ENCOUNTER — Ambulatory Visit: Payer: Medicare Other

## 2020-06-10 ENCOUNTER — Ambulatory Visit (INDEPENDENT_AMBULATORY_CARE_PROVIDER_SITE_OTHER): Payer: Medicare Other

## 2020-06-10 ENCOUNTER — Other Ambulatory Visit: Payer: Self-pay

## 2020-06-10 DIAGNOSIS — E538 Deficiency of other specified B group vitamins: Secondary | ICD-10-CM | POA: Diagnosis not present

## 2020-06-10 MED ORDER — CYANOCOBALAMIN 1000 MCG/ML IJ SOLN
1000.0000 ug | Freq: Once | INTRAMUSCULAR | Status: AC
  Administered 2020-06-10: 1000 ug via INTRAMUSCULAR

## 2020-06-10 NOTE — Progress Notes (Signed)
Pt tolerated injection well,

## 2020-06-12 ENCOUNTER — Other Ambulatory Visit: Payer: Self-pay

## 2020-06-12 ENCOUNTER — Ambulatory Visit
Admission: RE | Admit: 2020-06-12 | Discharge: 2020-06-12 | Disposition: A | Discharge status: Home / Self Care | Payer: Medicare Other | Source: Ambulatory Visit | Attending: Neurology | Admitting: Neurology

## 2020-06-12 ENCOUNTER — Ambulatory Visit: Payer: Medicare Other | Admitting: Neurology

## 2020-06-12 DIAGNOSIS — I639 Cerebral infarction, unspecified: Secondary | ICD-10-CM

## 2020-06-17 ENCOUNTER — Ambulatory Visit (INDEPENDENT_AMBULATORY_CARE_PROVIDER_SITE_OTHER): Payer: Medicare Other

## 2020-06-17 ENCOUNTER — Other Ambulatory Visit: Payer: Self-pay

## 2020-06-17 DIAGNOSIS — E538 Deficiency of other specified B group vitamins: Secondary | ICD-10-CM | POA: Diagnosis not present

## 2020-06-17 MED ORDER — CYANOCOBALAMIN 1000 MCG/ML IJ SOLN
1000.0000 ug | Freq: Once | INTRAMUSCULAR | Status: AC
  Administered 2020-06-17: 1000 ug via INTRAMUSCULAR

## 2020-06-24 ENCOUNTER — Other Ambulatory Visit: Payer: Self-pay

## 2020-06-24 ENCOUNTER — Ambulatory Visit (INDEPENDENT_AMBULATORY_CARE_PROVIDER_SITE_OTHER): Payer: Medicare Other

## 2020-06-24 DIAGNOSIS — E538 Deficiency of other specified B group vitamins: Secondary | ICD-10-CM

## 2020-06-24 MED ORDER — CYANOCOBALAMIN 1000 MCG/ML IJ SOLN
1000.0000 ug | Freq: Once | INTRAMUSCULAR | Status: AC
  Administered 2020-06-24: 1000 ug via INTRAMUSCULAR

## 2020-07-01 ENCOUNTER — Ambulatory Visit (INDEPENDENT_AMBULATORY_CARE_PROVIDER_SITE_OTHER): Payer: Medicare Other

## 2020-07-01 ENCOUNTER — Other Ambulatory Visit: Payer: Self-pay

## 2020-07-01 DIAGNOSIS — E538 Deficiency of other specified B group vitamins: Secondary | ICD-10-CM

## 2020-07-01 MED ORDER — CYANOCOBALAMIN 1000 MCG/ML IJ SOLN
1000.0000 ug | Freq: Once | INTRAMUSCULAR | Status: AC
Start: 2020-07-01 — End: 2020-07-01
  Administered 2020-07-01: 1000 ug via INTRAMUSCULAR

## 2020-07-04 DIAGNOSIS — Z0000 Encounter for general adult medical examination without abnormal findings: Secondary | ICD-10-CM | POA: Diagnosis not present

## 2020-07-04 DIAGNOSIS — Z1389 Encounter for screening for other disorder: Secondary | ICD-10-CM | POA: Diagnosis not present

## 2020-07-04 DIAGNOSIS — E1169 Type 2 diabetes mellitus with other specified complication: Secondary | ICD-10-CM | POA: Diagnosis not present

## 2020-07-04 DIAGNOSIS — E782 Mixed hyperlipidemia: Secondary | ICD-10-CM | POA: Diagnosis not present

## 2020-07-04 DIAGNOSIS — L409 Psoriasis, unspecified: Secondary | ICD-10-CM | POA: Diagnosis not present

## 2020-07-04 DIAGNOSIS — I10 Essential (primary) hypertension: Secondary | ICD-10-CM | POA: Diagnosis not present

## 2020-07-24 ENCOUNTER — Ambulatory Visit: Payer: Medicare Other | Admitting: Internal Medicine

## 2020-07-24 ENCOUNTER — Encounter: Payer: Self-pay | Admitting: Internal Medicine

## 2020-07-24 ENCOUNTER — Other Ambulatory Visit: Payer: Self-pay

## 2020-07-24 VITALS — BP 120/72 | HR 75 | Ht 66.0 in | Wt 185.5 lb

## 2020-07-24 DIAGNOSIS — E1165 Type 2 diabetes mellitus with hyperglycemia: Secondary | ICD-10-CM

## 2020-07-24 LAB — POCT GLYCOSYLATED HEMOGLOBIN (HGB A1C): Hemoglobin A1C: 5 % (ref 4.0–5.6)

## 2020-07-24 MED ORDER — METFORMIN HCL ER 500 MG PO TB24: 500.0000 mg | ORAL_TABLET | Freq: Two times a day (BID) | ORAL | 3 refills | Status: DC

## 2020-07-24 MED ORDER — ONETOUCH DELICA LANCETS 30G MISC: 1.0000 | Freq: Every day | 11 refills | Status: DC

## 2020-07-24 NOTE — Patient Instructions (Addendum)
-   Decrease  Metformin 1 tablets twice daily  - Continue Ozempic 0.5 mg Weekly        HOW TO TREAT LOW BLOOD SUGARS (Blood sugar LESS THAN 70 MG/DL)  Please follow the RULE OF 15 for the treatment of hypoglycemia treatment (when your (blood sugars are less than 70 mg/dL)    STEP 1: Take 15 grams of carbohydrates when your blood sugar is low, which includes:   3-4 GLUCOSE TABS  OR  3-4 OZ OF JUICE OR REGULAR SODA OR  ONE TUBE OF GLUCOSE GEL     STEP 2: RECHECK blood sugar in 15 MINUTES STEP 3: If your blood sugar is still low at the 15 minute recheck --> then, go back to STEP 1 and treat AGAIN with another 15 grams of carbohydrates.

## 2020-07-24 NOTE — Progress Notes (Signed)
Name: Caroline White  Age/ Sex: 69 y.o., female   MRN/ DOB: [id number], 12/17/1950     PCP: Ehinger, Robert, MD   Reason for Endocrinology Evaluation: Type 2 Diabetes Mellitus  Initial Endocrine Consultative Visit: 08/03/2019    PATIENT IDENTIFIER: Caroline White is a 69 y.o. female with a past medical history of T2DM, HTN and Dyslipidemia . The patient has followed with Endocrinology clinic since 08/03/2019 for consultative assistance with management of her diabetes.  DIABETIC HISTORY:  Caroline White was diagnosed with T2DM many years ago, Has been on oral glycemic agents since her diagnosis. No insulin in the past.    On her initial visit to our clinic she had an A1c of 9.5%. She was on metformin and Glimepiride , we increased Glimepiride    GLP-1 agonist started 10/2019   Glimepiride stopped 03/2020 due to low A1c of 5.3%  SUBJECTIVE:   During the last visit (04/24/2020): A1c 5.3% . Continued Metformin and Ozempic       Today (07/24/2020): Caroline White is here for a follow up on diabetes management. She is accompanied by daughter in-law Caroline White.   She checks her blood sugars occasionally.The patient has not had any  hypoglycemic episodes since her last visit here.     Pt with chronic diarrhea/loose stools for ~10 yrs, this has not worsened by Ozempic     HOME DIABETES REGIMEN:  Metformin 500 mg, 2 tablets before Breakfast and 2 tablets before Supper  Ozempic 0.5 mg weekly ( Sundays)     Statin: yes ACE-I/ARB: yes     METER DOWNLOAD SUMMARY: 2/11-5/01/2021 Average Number Tests/Day = 0.1 Overall Mean FS Glucose = 123 Standard Deviation = 20  BG Ranges: Low = 89 High = 150   Hypoglycemic Events/30 Days: BG < 50 = 0 Episodes of symptomatic severe hypoglycemia = 0     DIABETIC COMPLICATIONS: Microvascular complications:    Denies: CKD, retinopathy, neuropathy  Last Eye Exam: Completed 10/2019  Macrovascular complications:    Denies:  CAD, CVA, PVD   HISTORY:  Past Medical History:  Past Medical History:  Diagnosis Date  . Diabetes mellitus    type II--no medications  diet control  . History of kidney stones    "Passed"  . Hyperlipidemia   . Hypertension   . 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 Path confirmed intraductal papilloma   . Nipple discharge    bloody from Lt breast  . Psoriasis    Past Surgical History:  Past Surgical History:  Procedure Laterality Date  . ABDOMINAL HYSTERECTOMY  1998   full  . BREAST DUCTAL SYSTEM EXCISION  06/18/2011   Procedure: EXCISION DUCTAL SYSTEM BREAST;  Surgeon: Christian J Streck, MD;  Location: [facility];  Service: General;  Laterality: Left;  Left Breast Ductal Excision  . BREAST EXCISIONAL BIOPSY Left   . COLONOSCOPY  11/19/2010   Mild pancolonic diverticuloisis. Small internal hemorrhoids. Otherwise normal colonoscopy to terminal ileum  . nasal surgery 1965    . TONSILLECTOMY AND ADENOIDECTOMY  1958  . TUBAL LIGATION      Social History:  reports that she has never smoked. She has never used smokeless tobacco. She reports that she does not drink alcohol and does not use drugs. Family History:  Family History  Problem Relation Age of Onset  . Cancer Mother        uterine and stomach  . Stroke Father   . Cancer Maternal Grandmother          breast  . Breast cancer Maternal Grandmother      HOME MEDICATIONS: Allergies as of 07/24/2020      Reactions   Fish Oil    Heart racing   Sulfa Drugs Cross Reactors Other (See Comments)   Unknown   Zestril [lisinopril] Cough      Medication List       Accurate as of Jul 24, 2020  2:35 PM. If you have any questions, ask your nurse or doctor.        STOP taking these medications   ONE TOUCH ULTRA TEST test strip Generic drug: glucose blood Stopped by: Dorita Sciara, MD   valsartan-hydrochlorothiazide 80-12.5 MG tablet Commonly known as: DIOVAN-HCT Stopped by: Dorita Sciara, MD     TAKE these medications   amLODipine 10 MG tablet Commonly known as: NORVASC Take 10 mg by mouth daily.   B-12 Compliance Injection 1000 MCG/ML Kit Generic drug: Cyanocobalamin 1000 mcg daily for 1 week, then weekly for 1 month, then monthly for a year.   Easy Touch FlipLock Safety Syr 21G X 1-1/2" 3 ML Misc Generic drug: SYRINGE-NEEDLE (DISP) 3 ML Use a new needle for each injection   memantine 10 MG tablet Commonly known as: NAMENDA Take 1 tablet every night for 2 weeks, then increase to 1 tablet twice a day What changed:   how much to take  how to take this  when to take this  additional instructions   metFORMIN 500 MG 24 hr tablet Commonly known as: GLUCOPHAGE-XR Take 2 tablets (1,000 mg total) by mouth 2 (two) times daily. 2 tabs 2 times daily   metoprolol succinate 25 MG 24 hr tablet Commonly known as: TOPROL-XL Take 50 mg by mouth daily.   multivitamin capsule Take 1 capsule by mouth daily.   Ozempic (0.25 or 0.5 MG/DOSE) 2 MG/1.5ML Sopn Generic drug: Semaglutide(0.25 or 0.5MG/DOS) Inject 0.5 mg into the skin once a week.   rosuvastatin 20 MG tablet Commonly known as: CRESTOR 1 tablet What changed: Another medication with the same name was removed. Continue taking this medication, and follow the directions you see here. Changed by: Dorita Sciara, MD   sharps container 1 each by Does not apply route as needed.        OBJECTIVE:   Vital Signs: BP 120/72   Pulse 75   Ht 5' 6" (1.676 m)   Wt 185 lb 8 oz (84.1 kg)   SpO2 98%   BMI 29.94 kg/m   Wt Readings from Last 3 Encounters:  07/24/20 185 lb 8 oz (84.1 kg)  05/07/20 193 lb 3.2 oz (87.6 kg)  04/24/20 195 lb 8 oz (88.7 kg)     Exam: General: Pt appears well and is in NAD  Lungs: Clear with good BS bilat     Heart: RRR   Extremities: No pretibial edema.  Neuro: Pt with difficulty following commands     DM foot exam: 04/24/2020 The skin of the feet is intact  without sores or ulcerations. The pedal pulses are 2+ on right and 2+ on left. The sensation is intact to a screening 5.07, 10 gram monofilament bilaterally   DATA REVIEWED:   Results for Caroline White "PAT" (MRN [id number]) as of 04/24/2020 13:50  Ref. Range 04/03/2020 12:22  Sodium Latest Ref Range: 135 - 145 mEq/L 137  Potassium Latest Ref Range: 3.5 - 5.1 mEq/L 3.8  Chloride Latest Ref Range: 96 - 112 mEq/L 100  CO2 Latest Ref Range:  19 - 32 mEq/L 28  Glucose Latest Ref Range: 70 - 99 mg/dL 96  BUN Latest Ref Range: 6 - 23 mg/dL 15  Creatinine Latest Ref Range: 0.40 - 1.20 mg/dL 0.99  Calcium Latest Ref Range: 8.4 - 10.5 mg/dL 9.8  Alkaline Phosphatase Latest Ref Range: 39 - 117 U/L 59  Albumin Latest Ref Range: 3.5 - 5.2 g/dL 4.7  Lipase Latest Ref Range: 11.0 - 59.0 U/L 25.0  AST Latest Ref Range: 0 - 37 U/L 16  ALT Latest Ref Range: 0 - 35 U/L 17  Total Protein Latest Ref Range: 6.0 - 8.3 g/dL 7.6  Total Bilirubin Latest Ref Range: 0.2 - 1.2 mg/dL 0.8  GFR Latest Ref Range: >60.00 mL/min 58.33 (L)  C-Reactive Protein, Cardiac Latest Ref Range: 0.000 - 5.000 mg/L 13.280 (H)  WBC Latest Ref Range: 4.0 - 10.5 K/uL 8.8  RBC Latest Ref Range: 3.87 - 5.11 Mil/uL 4.13  Hemoglobin Latest Ref Range: 12.0 - 15.0 g/dL 12.4  HCT Latest Ref Range: 36.0 - 46.0 % 36.4  MCV Latest Ref Range: 78.0 - 100.0 fl 88.2  MCHC Latest Ref Range: 30.0 - 36.0 g/dL 34.2  RDW Latest Ref Range: 11.5 - 15.5 % 15.5  Platelets Latest Ref Range: 150.0 - 400.0 K/uL 223.0  Hemoglobin A1C Latest Ref Range: 4.6 - 6.5 % 5.3    ASSESSMENT / PLAN / RECOMMENDATIONS:   1) Type 2 Diabetes Mellitus, Optimally  controlled, With microalbuminuria complications - Most recent A1c of 5.0 %. Goal A1c < 7.0 %.    -Family has been doing great in taking care of of Ms. Parfitt's diabetes care.  In review of her meter download there is no evidence of hypoglycemia. -Given optimal A1c I am going to decrease her metformin  as below   -  MEDICATIONS: - Decreased  Metformin 1 tablets twice daily  - Continue Ozempic 0.5 mg weekly     EDUCATION / INSTRUCTIONS:  BG monitoring instructions: Patient is instructed to check her blood sugars 3 times a week  Call [facility] if: BG persistently < 70  . I reviewed the Rule of 15 for the treatment of hypoglycemia in detail with the patient. Literature supplied.    2) Diabetic complications:   Eye: Does not have known diabetic retinopathy.   Neuro/ Feet: Does not have known diabetic peripheral neuropathy .   Renal: Patient does not have known baseline CKD. She   is  on an ACEI/ARB at present    F/U in 6 months    Signed electronically by: Abby Jaralla , MD  [facility]  [facility] [address] [place], [place] [address] Phone: [phone] FAX: [phone]   CC: Ehinger, Robert, MD [address] [place]  [address] Phone: [phone]  Fax: [phone]  Return to Endocrinology clinic as below: Future Appointments  Date Time Provider Department Center  07/29/2020 10:30 AM [facility] NURSE [facility] None  11/19/2020  9:00 AM Aquino, Karen M, MD [facility] None      

## 2020-07-26 DIAGNOSIS — N289 Disorder of kidney and ureter, unspecified: Secondary | ICD-10-CM | POA: Diagnosis not present

## 2020-07-29 ENCOUNTER — Other Ambulatory Visit: Payer: Self-pay

## 2020-07-29 ENCOUNTER — Ambulatory Visit (INDEPENDENT_AMBULATORY_CARE_PROVIDER_SITE_OTHER): Payer: Medicare Other

## 2020-07-29 DIAGNOSIS — E538 Deficiency of other specified B group vitamins: Secondary | ICD-10-CM | POA: Diagnosis not present

## 2020-07-29 MED ORDER — CYANOCOBALAMIN 1000 MCG/ML IJ SOLN
1000.0000 ug | Freq: Once | INTRAMUSCULAR | Status: AC
  Administered 2020-07-29: 1000 ug via INTRAMUSCULAR

## 2020-08-29 ENCOUNTER — Ambulatory Visit (INDEPENDENT_AMBULATORY_CARE_PROVIDER_SITE_OTHER): Payer: Medicare Other

## 2020-08-29 ENCOUNTER — Other Ambulatory Visit: Payer: Self-pay

## 2020-08-29 DIAGNOSIS — E538 Deficiency of other specified B group vitamins: Secondary | ICD-10-CM | POA: Diagnosis not present

## 2020-08-29 MED ORDER — CYANOCOBALAMIN 1000 MCG/ML IJ SOLN
1000.0000 ug | Freq: Once | INTRAMUSCULAR | Status: AC
  Administered 2020-08-29: 1000 ug via INTRAMUSCULAR

## 2020-09-11 ENCOUNTER — Ambulatory Visit (HOSPITAL_COMMUNITY)
Admission: RE | Admit: 2020-09-11 | Discharge: 2020-09-11 | Disposition: A | Discharge status: Home / Self Care | Payer: Medicare Other | Source: Ambulatory Visit | Attending: Neurology | Admitting: Neurology

## 2020-09-11 ENCOUNTER — Other Ambulatory Visit: Payer: Self-pay

## 2020-09-11 DIAGNOSIS — I517 Cardiomegaly: Secondary | ICD-10-CM | POA: Insufficient documentation

## 2020-09-11 DIAGNOSIS — I639 Cerebral infarction, unspecified: Secondary | ICD-10-CM | POA: Insufficient documentation

## 2020-09-11 DIAGNOSIS — I6389 Other cerebral infarction: Secondary | ICD-10-CM | POA: Diagnosis not present

## 2020-09-11 DIAGNOSIS — E119 Type 2 diabetes mellitus without complications: Secondary | ICD-10-CM | POA: Insufficient documentation

## 2020-09-11 LAB — ECHOCARDIOGRAM COMPLETE BUBBLE STUDY
Area-P 1/2: 3.37 cm2
S' Lateral: 3 cm

## 2020-09-11 MED ORDER — SODIUM CHLORIDE 0.9% FLUSH
10.0000 mL | Freq: Once | INTRAVENOUS | Status: AC
  Administered 2020-09-11: 10 mL via INTRAVENOUS

## 2020-09-11 NOTE — Progress Notes (Signed)
  Echocardiogram 2D Echocardiogram with bubble study has been performed.  Darlina Sicilian M 09/11/2020, 1:51 PM

## 2020-09-26 ENCOUNTER — Ambulatory Visit: Payer: Medicare Other

## 2020-09-26 ENCOUNTER — Other Ambulatory Visit: Payer: Self-pay

## 2020-09-26 DIAGNOSIS — E538 Deficiency of other specified B group vitamins: Secondary | ICD-10-CM | POA: Diagnosis not present

## 2020-09-26 MED ORDER — CYANOCOBALAMIN 1000 MCG/ML IJ SOLN
1000.0000 ug | Freq: Once | INTRAMUSCULAR | Status: AC
  Administered 2020-09-26: 1000 ug via INTRAMUSCULAR

## 2020-10-28 ENCOUNTER — Other Ambulatory Visit: Payer: Self-pay

## 2020-10-28 ENCOUNTER — Ambulatory Visit: Payer: Medicare Other

## 2020-10-28 DIAGNOSIS — E538 Deficiency of other specified B group vitamins: Secondary | ICD-10-CM

## 2020-10-28 MED ORDER — CYANOCOBALAMIN 1000 MCG/ML IJ SOLN
1000.0000 ug | Freq: Once | INTRAMUSCULAR | Status: AC
  Administered 2020-10-28: 1000 ug via INTRAMUSCULAR

## 2020-10-31 ENCOUNTER — Ambulatory Visit
Admission: RE | Admit: 2020-10-31 | Discharge: 2020-10-31 | Disposition: A | Discharge status: Home / Self Care | Payer: Medicare Other | Source: Ambulatory Visit | Attending: Family Medicine | Admitting: Family Medicine

## 2020-10-31 ENCOUNTER — Other Ambulatory Visit: Payer: Self-pay | Admitting: Family Medicine

## 2020-10-31 DIAGNOSIS — M25551 Pain in right hip: Secondary | ICD-10-CM

## 2020-10-31 IMAGING — CR DG FEMUR 2+V*R*
4 series · 4 of 4 positions shown · non-contrast
Comparison: None.

CLINICAL DATA: Right-sided hip pain

EXAM:
RIGHT FEMUR 2 VIEWS

[t femur with hip  ap right]
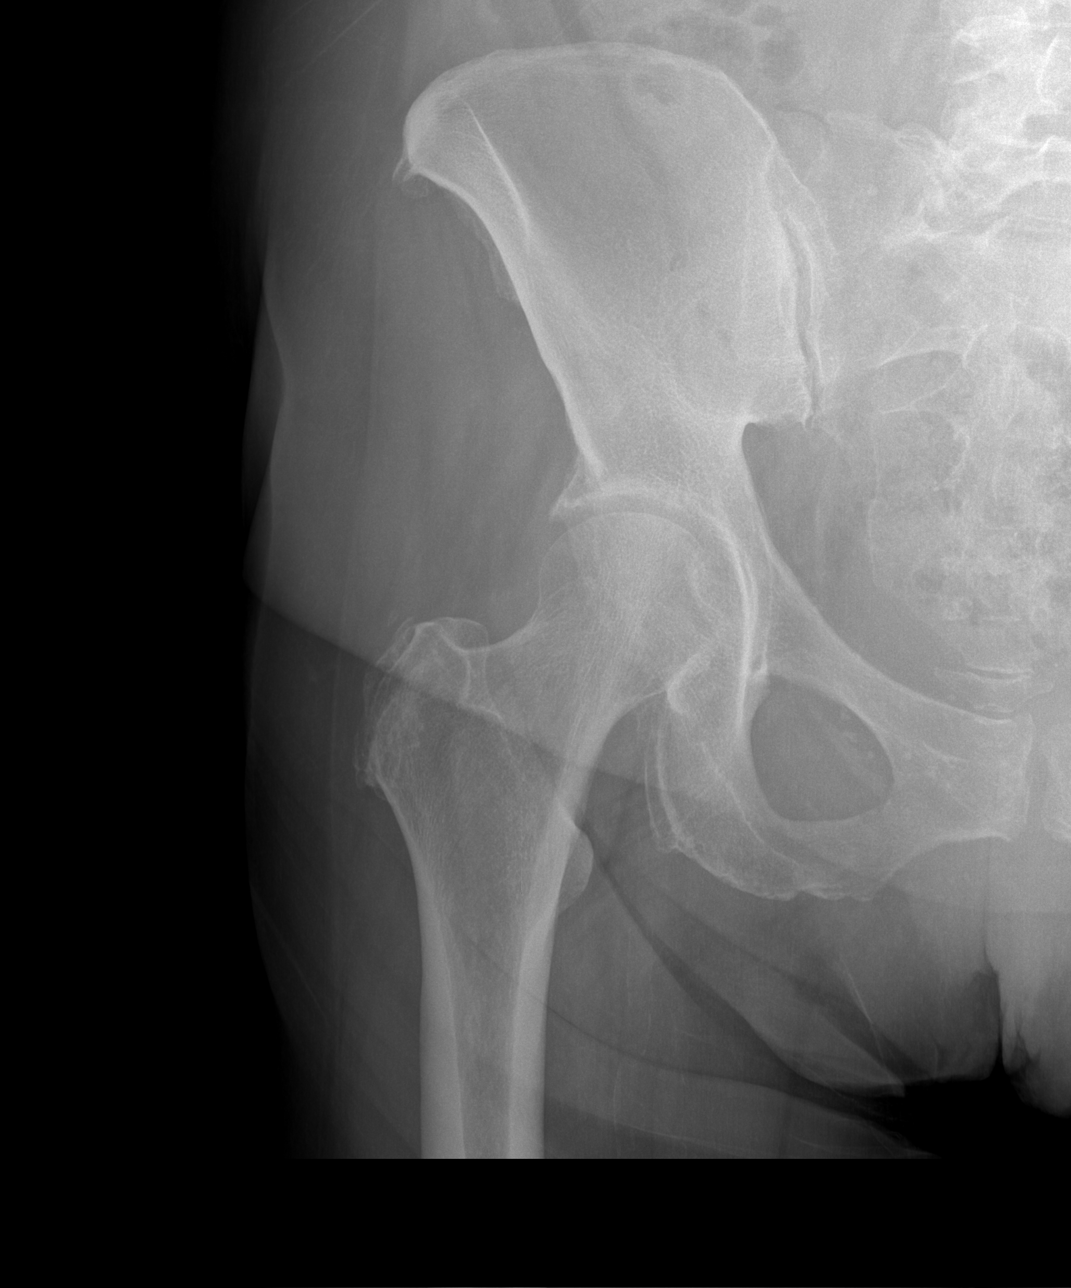

[t femur with knee ap right]
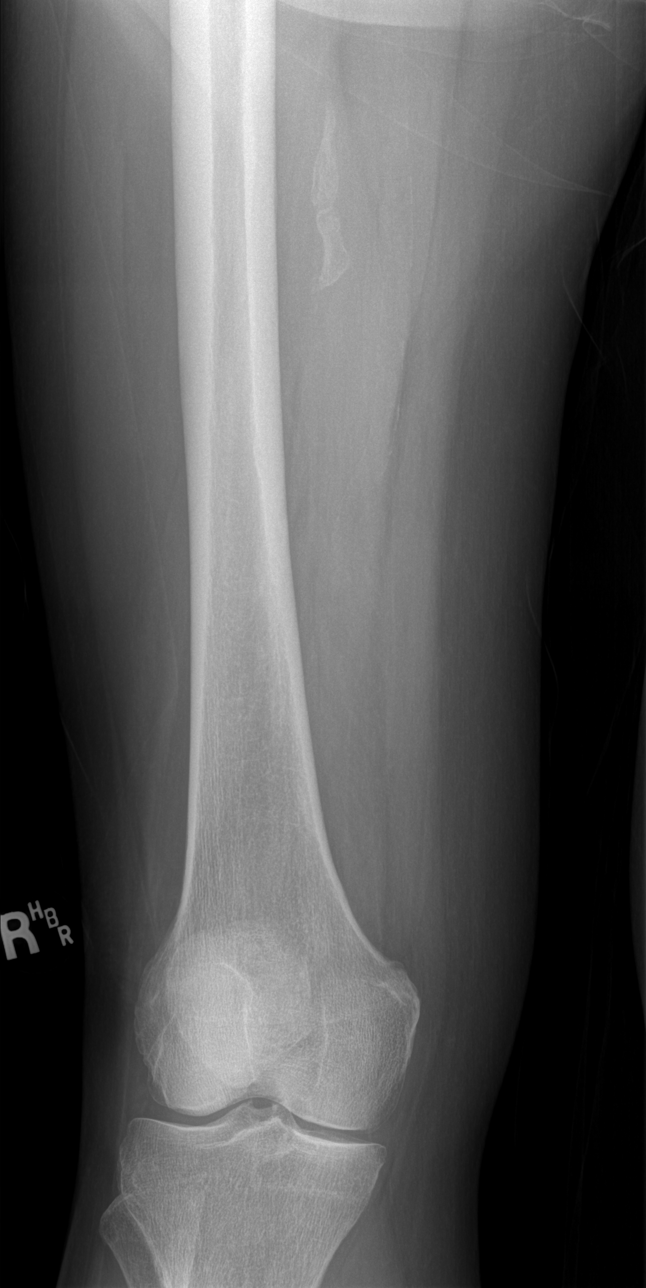

[t femur with hip lat right]
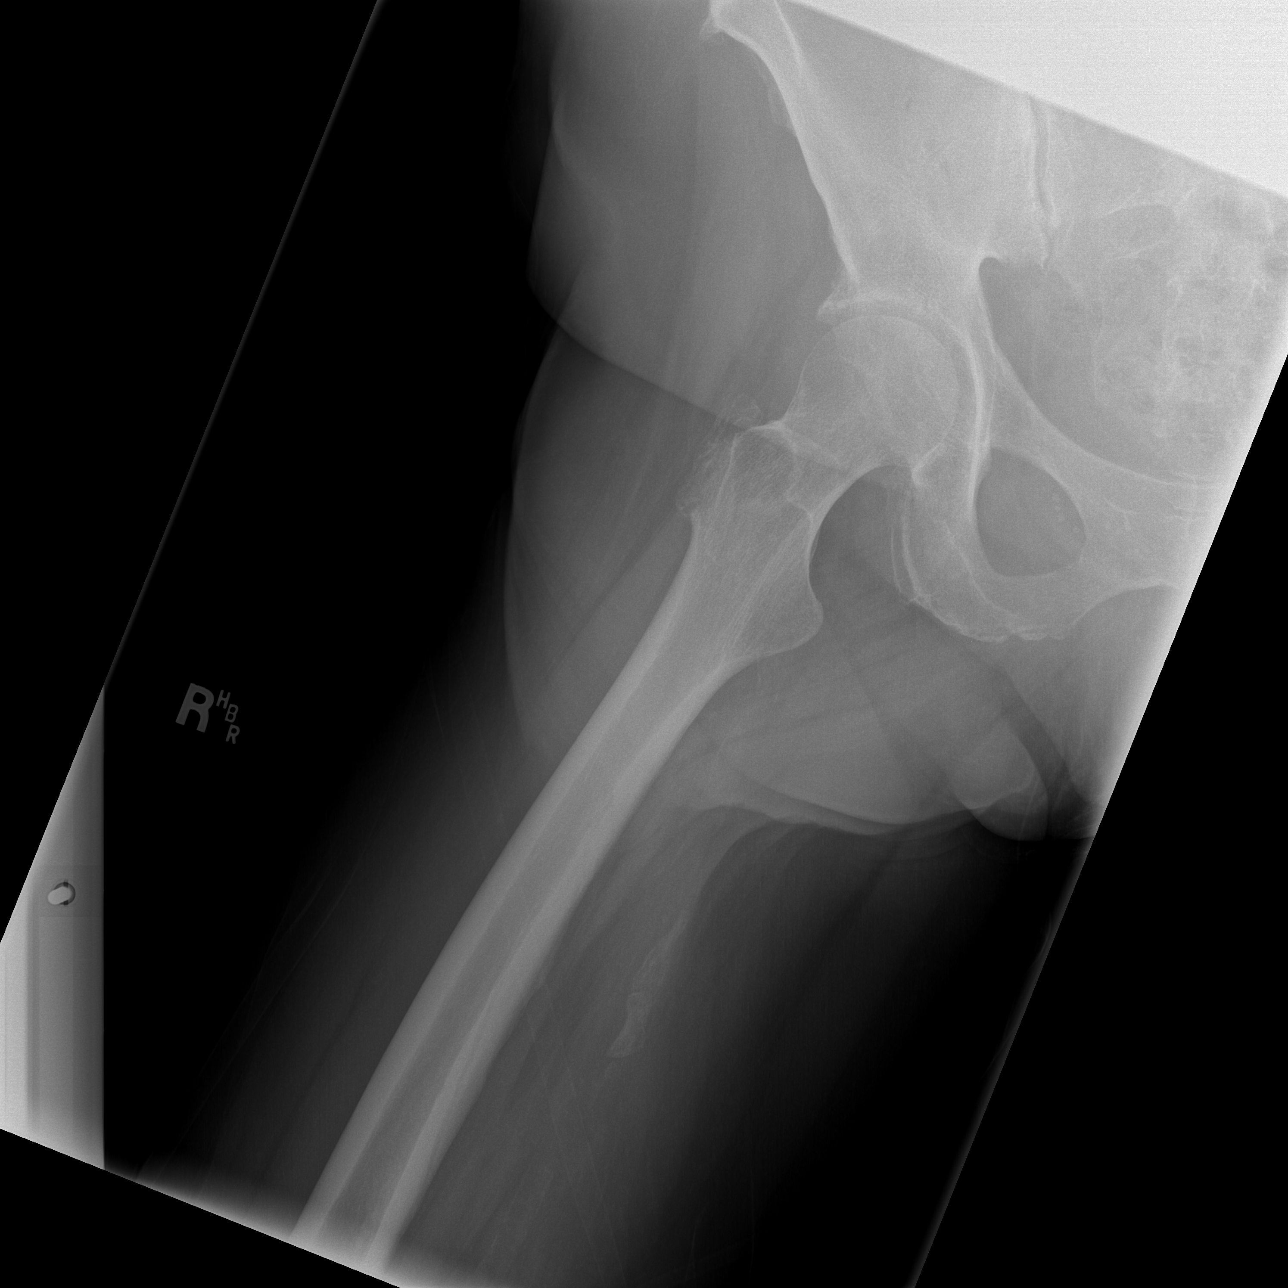

[t femur with knee lat right]
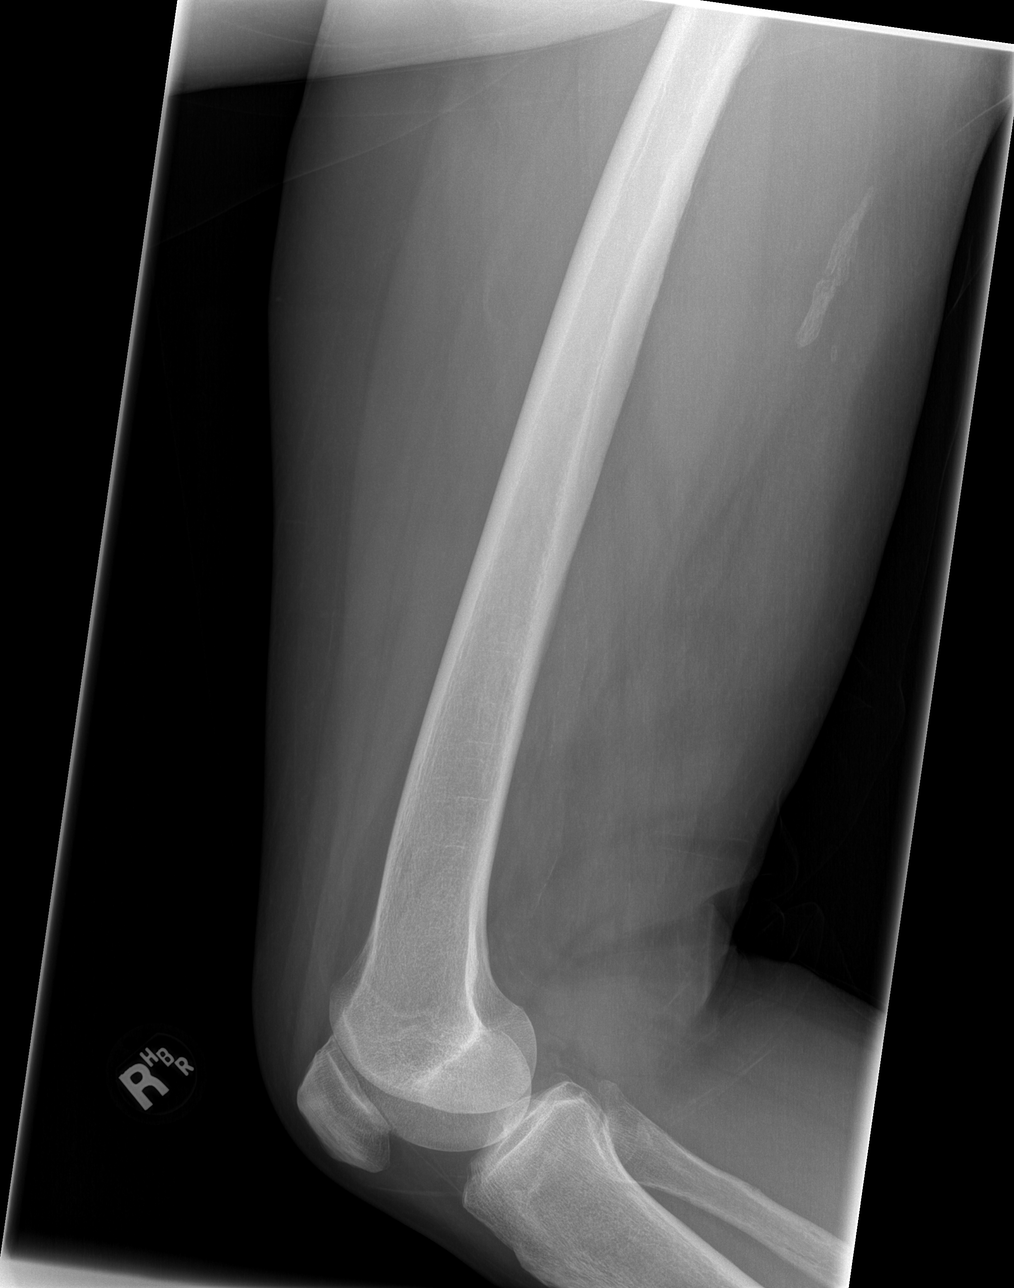

[4 of 4 positions shown; findings below may reference images not displayed]

FINDINGS: There is no evidence of fracture or other focal bone lesions.
Probable dystrophic calcification within the inter upper thigh
IMPRESSION: Negative.

## 2020-11-19 ENCOUNTER — Ambulatory Visit (INDEPENDENT_AMBULATORY_CARE_PROVIDER_SITE_OTHER): Payer: Medicare Other

## 2020-11-19 ENCOUNTER — Other Ambulatory Visit: Payer: Self-pay

## 2020-11-19 ENCOUNTER — Encounter: Payer: Self-pay | Admitting: Neurology

## 2020-11-19 ENCOUNTER — Ambulatory Visit: Payer: Medicare Other | Admitting: Neurology

## 2020-11-19 VITALS — BP 95/62 | HR 60 | Ht 66.0 in | Wt 166.6 lb

## 2020-11-19 DIAGNOSIS — F0151 Vascular dementia with behavioral disturbance: Secondary | ICD-10-CM | POA: Diagnosis not present

## 2020-11-19 DIAGNOSIS — E538 Deficiency of other specified B group vitamins: Secondary | ICD-10-CM

## 2020-11-19 DIAGNOSIS — F01518 Vascular dementia, unspecified severity, with other behavioral disturbance: Secondary | ICD-10-CM

## 2020-11-19 DIAGNOSIS — I639 Cerebral infarction, unspecified: Secondary | ICD-10-CM

## 2020-11-19 MED ORDER — CYANOCOBALAMIN 1000 MCG/ML IJ SOLN
1000.0000 ug | Freq: Once | INTRAMUSCULAR | Status: AC
  Administered 2020-11-19: 1000 ug via INTRAMUSCULAR

## 2020-11-19 NOTE — Patient Instructions (Signed)
Good to see you.  Continue Memantine '10mg'$  twice a day  2. Schedule MRA head without contrast, MRA neck without contrast  3. Continue B12 injections  4. Continue close supervision  5. Follow-up in 6 months, call for any changes   FALL PRECAUTIONS: Be cautious when walking. Scan the area for obstacles that may increase the risk of trips and falls. When getting up in the mornings, sit up at the edge of the bed for a few minutes before getting out of bed. Consider elevating the bed at the head end to avoid drop of blood pressure when getting up. Walk always in a well-lit room (use night lights in the walls). Avoid area rugs or power cords from appliances in the middle of the walkways. Use a walker or a cane if necessary and consider physical therapy for balance exercise. Get your eyesight checked regularly.  HOME SAFETY: Consider the safety of the kitchen when operating appliances like stoves, microwave oven, and blender. Consider having supervision and share cooking responsibilities until no longer able to participate in those. Accidents with firearms and other hazards in the house should be identified and addressed as well.  ABILITY TO BE LEFT ALONE: If patient is unable to contact 911 operator, consider using LifeLine, or when the need is there, arrange for someone to stay with patients. Smoking is a fire hazard, consider supervision or cessation. Risk of wandering should be assessed by caregiver and if detected at any point, supervision and safe proof recommendations should be instituted.  MEDICATION SUPERVISION: Inability to self-administer medication needs to be constantly addressed. Implement a mechanism to ensure safe administration of the medications.  RECOMMENDATIONS FOR ALL PATIENTS WITH MEMORY PROBLEMS: 1. Continue to exercise (Recommend 30 minutes of walking everyday, or 3 hours every week) 2. Increase social interactions - continue going to [place] and enjoy social gatherings with  friends and family 3. Eat healthy, avoid fried foods and eat more fruits and vegetables 4. Maintain adequate blood pressure, blood sugar, and blood cholesterol level. Reducing the risk of stroke and cardiovascular disease also helps promoting better memory. 5. Avoid stressful situations. Live a simple life and avoid aggravations. Organize your time and prepare for the next day in anticipation. 6. Sleep well, avoid any interruptions of sleep and avoid any distractions in the bedroom that may interfere with adequate sleep quality 7. Avoid sugar, avoid sweets as there is a strong link between excessive sugar intake, diabetes, and cognitive impairment The Mediterranean diet has been shown to help patients reduce the risk of progressive memory disorders and reduces cardiovascular risk. This includes eating fish, eat fruits and green leafy vegetables, nuts like almonds and hazelnuts, walnuts, and also use olive oil. Avoid fast foods and fried foods as much as possible. Avoid sweets and sugar as sugar use has been linked to worsening of memory function.  There is always a concern of gradual progression of memory problems. If this is the case, then we may need to adjust level of care according to patient needs. Support, both to the patient and caregiver, should then be put into place.
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
## 2020-12-02 ENCOUNTER — Other Ambulatory Visit: Payer: Self-pay

## 2020-12-02 ENCOUNTER — Ambulatory Visit
Admission: RE | Admit: 2020-12-02 | Discharge: 2020-12-02 | Disposition: A | Discharge status: Home / Self Care | Payer: Medicare Other | Source: Ambulatory Visit | Attending: Neurology | Admitting: Neurology

## 2020-12-02 DIAGNOSIS — F0151 Vascular dementia with behavioral disturbance: Secondary | ICD-10-CM

## 2020-12-02 DIAGNOSIS — I63531 Cerebral infarction due to unspecified occlusion or stenosis of right posterior cerebral artery: Secondary | ICD-10-CM | POA: Diagnosis not present

## 2020-12-02 DIAGNOSIS — F01518 Vascular dementia, unspecified severity, with other behavioral disturbance: Secondary | ICD-10-CM

## 2020-12-02 DIAGNOSIS — I6601 Occlusion and stenosis of right middle cerebral artery: Secondary | ICD-10-CM | POA: Diagnosis not present

## 2020-12-02 DIAGNOSIS — I672 Cerebral atherosclerosis: Secondary | ICD-10-CM | POA: Diagnosis not present

## 2020-12-02 DIAGNOSIS — I639 Cerebral infarction, unspecified: Secondary | ICD-10-CM

## 2020-12-02 IMAGING — MR MR MRA HEAD W/O CM
1 series · 22 of 48 positions shown · non-contrast
Comparison: Previous MRI from [DATE].

CLINICAL DATA: Follow-up examination for stroke.

EXAM:
MRA HEAD WITHOUT CONTRAST
TECHNIQUE: Angiographic images of the Circle of Willis were acquired using MRA
technique without intravenous contrast.

[Series 4: tof_3d_multi-slab new · axial · 0.7mm · 0.35mm/px · z∈[-22,+72]mm · 22 of 143 slices shown]
[im 1/143]
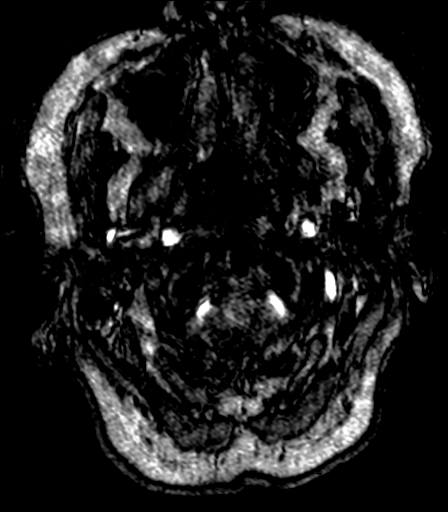
[im 4/143]
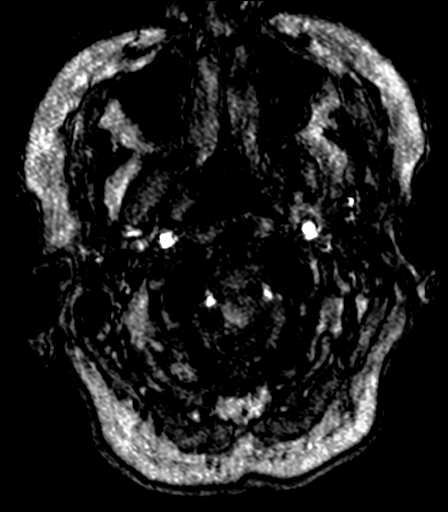
[im 7/143]
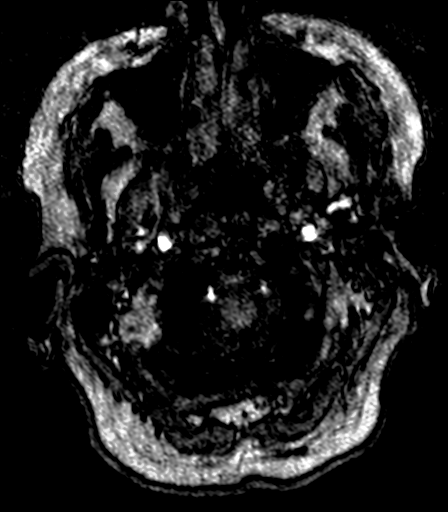
[im 10/143]
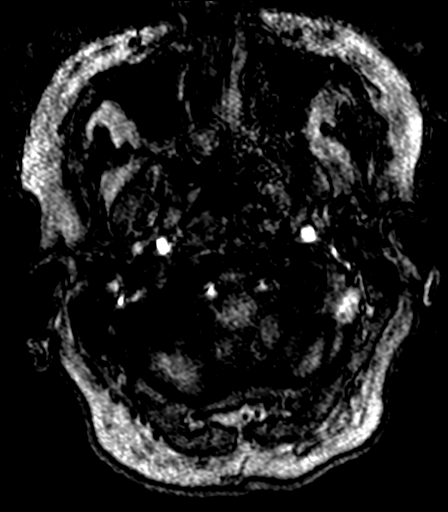
[im 13/143]
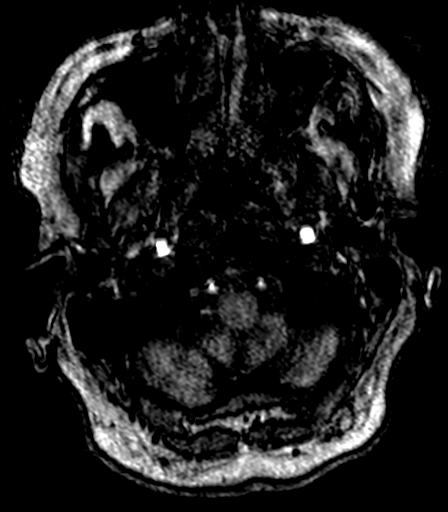
[im 16/143]
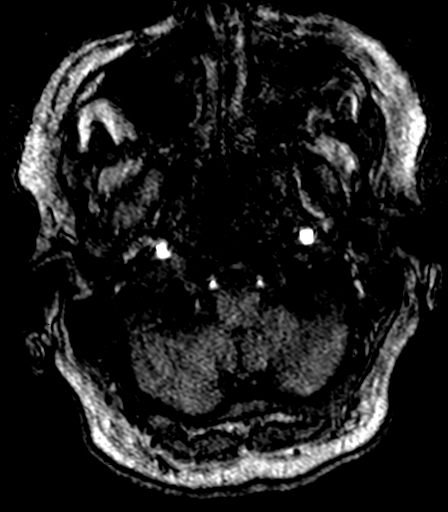
[im 19/143]
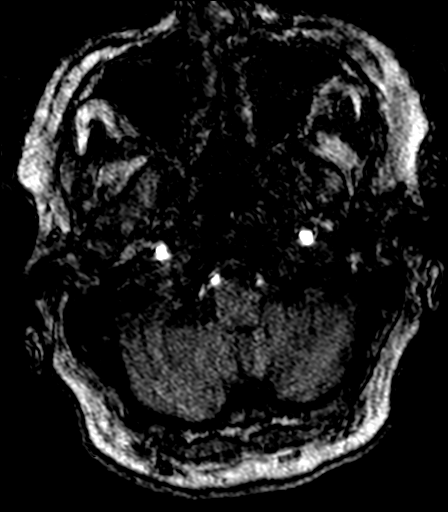
[im 22/143]
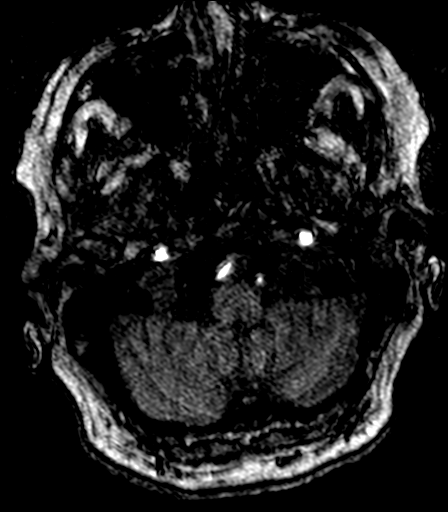
[im 25/143]
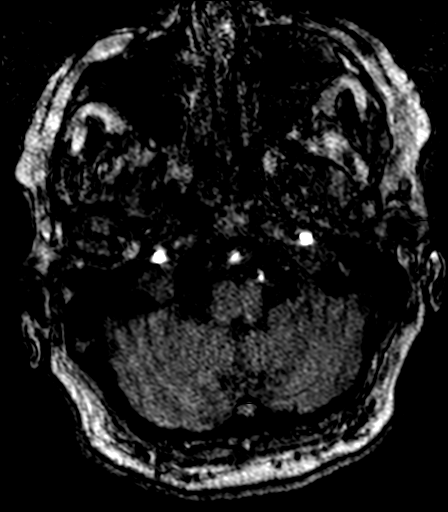
[im 28/143]
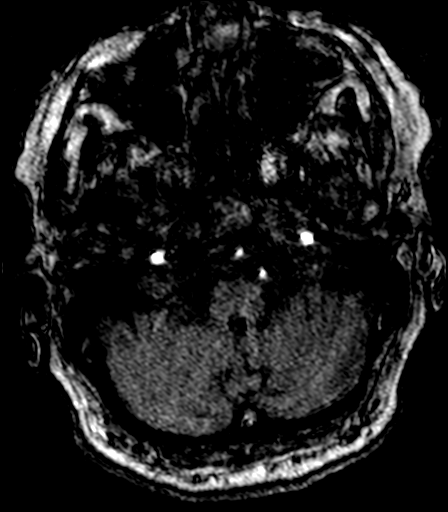
[im 31/143]
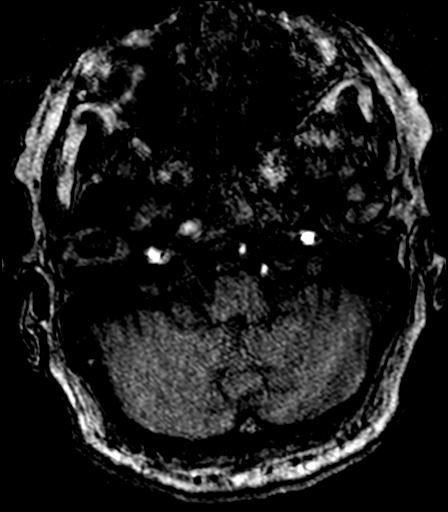
[im 34/143]
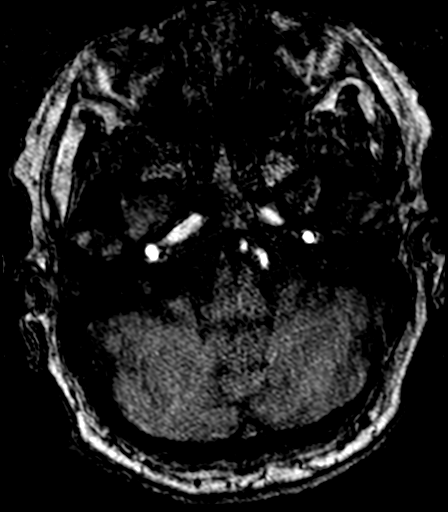
[im 37/143]
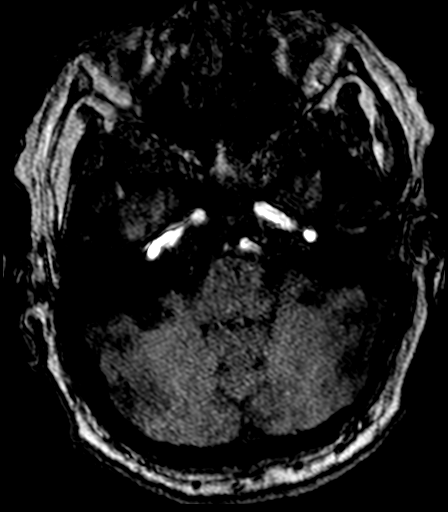
[im 40/143]
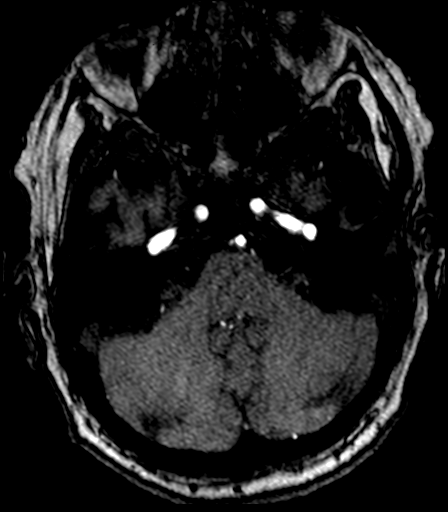
[im 46/143]
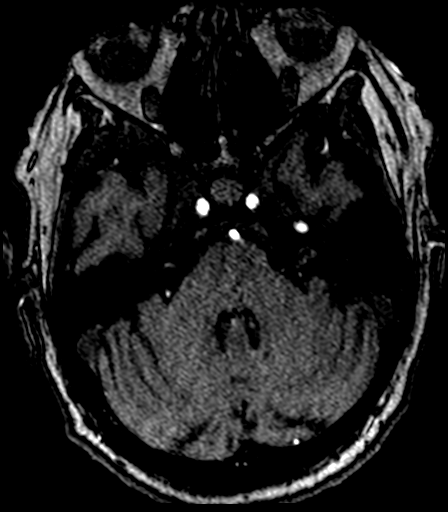
[im 64/143]
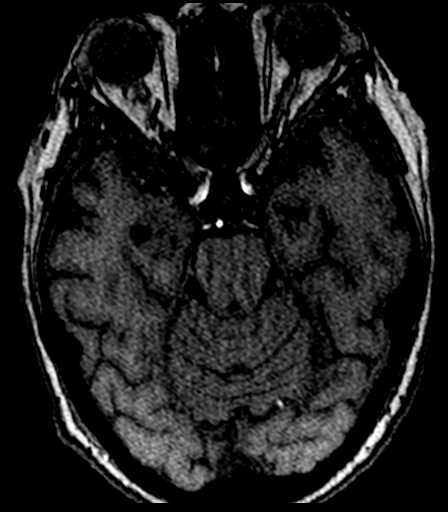
[im 73/143]
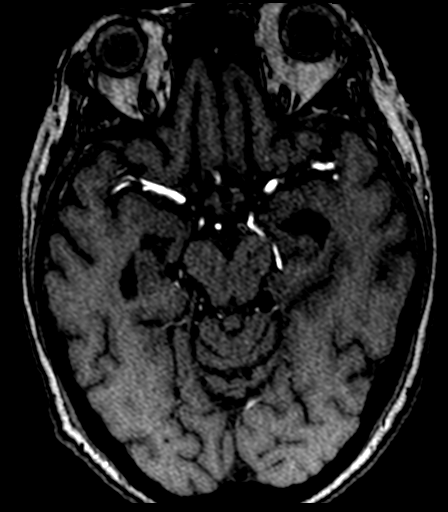
[im 82/143]
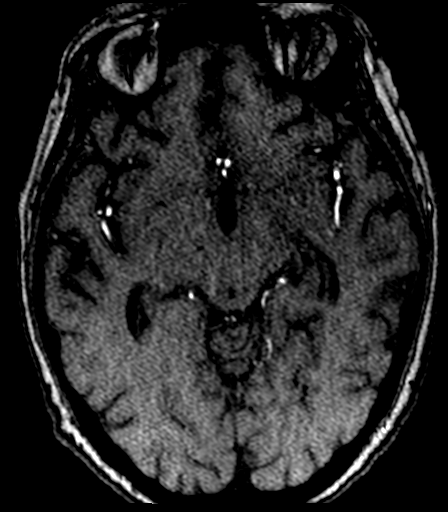
[im 100/143]
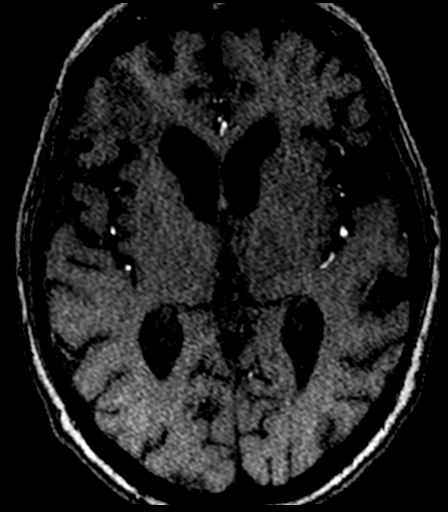
[im 118/143]
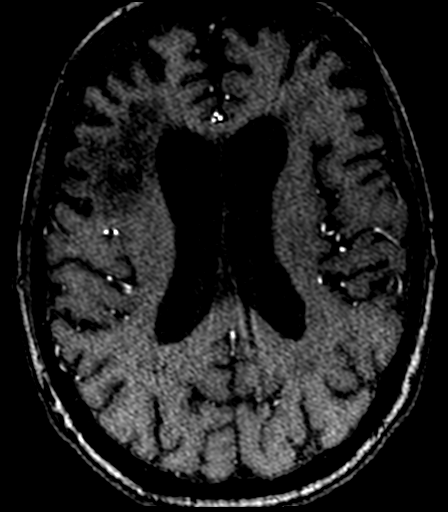
[im 121/143]
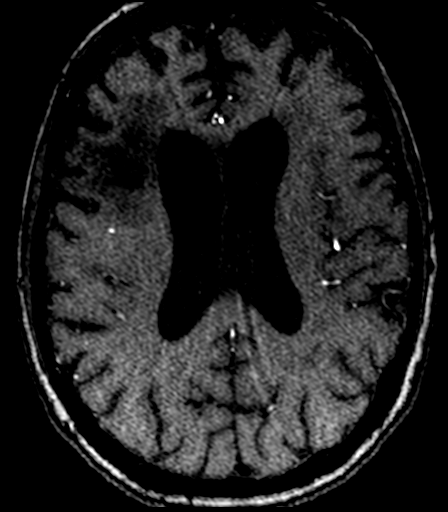
[im 136/143]
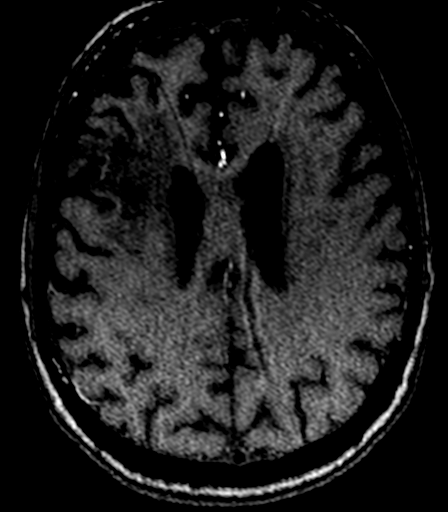

[22 of 48 positions shown; findings below may reference images not displayed]

FINDINGS: Anterior circulation: Examination moderately degraded by motion
artifact.

Visualized distal cervical segments of the internal carotid arteries
are patent with antegrade flow. Petrous, cavernous, and supraclinoid
segments patent without high-grade stenosis or other abnormality. A1
segments patent bilaterally. Normal anterior communicating artery
complex. Atheromatous irregularity throughout the ACAs bilaterally
without high-grade stenosis. No M1 stenosis or occlusion. Normal MCA
bifurcations. Distal left MCA branches well perfused. There is a
severe proximal right M2 stenosis, superior division (series 10,
image 12). Right MCA branches otherwise perfused distally.

Posterior circulation: Visualized V4 segments largely code dominant
and patent without visible stenosis. Right PICA origin patent and
normal. Left PICA origin not well seen. Basilar patent to its distal
aspect without stenosis. Superior cerebellar arteries patent
bilaterally. Right PCA supplied via the basilar as well as a small
right posterior communicating artery. Fetal type origin of the left
PCA. Short-segment severe right P1 stenosis noted (series 12, image
11). Suspected additional severe distal right P3 stenosis (series
13, image 14). On the left, there is a focal severe left P2 stenosis
(series 4, image 79). Left PCA irregular but otherwise grossly
patent to its distal aspect.

Anatomic variants: Fetal type origin of the left PCA.

Other: No intracranial aneurysm.
IMPRESSION: 1. Motion degraded exam.
2. Negative intracranial MRA for large vessel occlusion.
3. Intracranial atherosclerotic disease with associated multifocal
severe stenoses involving the right M2 branch, right P1 segment, and
bilateral P2/P3 segments as above.
4. Fetal type origin of the left PCA.

## 2020-12-06 ENCOUNTER — Telehealth: Payer: Self-pay

## 2020-12-06 NOTE — Telephone Encounter (Signed)
Spoke with pt daughter in law MRA showed narrowing in the blood vessels in the brain, important to continue aspirin and control of cholesterol, BP.  the last time cholesterol bloodwork was done was at her physical. Her next physical is in November when she will have it checked again and they will make sure you get a copy of the results,

## 2020-12-06 NOTE — Telephone Encounter (Signed)
-----   Message from Cameron Sprang, MD sent at 12/03/2020  4:47 PM EDT ----- Pls let son/daughter-in-law know that the MRA showed narrowing in the blood vessels in the brain, important to continue aspirin and control of cholesterol, BP. Can you pls ask them when the last time cholesterol bloodwork was done with PCP and if they have results? Thanks

## 2020-12-19 ENCOUNTER — Other Ambulatory Visit: Payer: Self-pay

## 2020-12-19 ENCOUNTER — Ambulatory Visit (INDEPENDENT_AMBULATORY_CARE_PROVIDER_SITE_OTHER): Payer: Medicare Other

## 2020-12-19 DIAGNOSIS — E538 Deficiency of other specified B group vitamins: Secondary | ICD-10-CM

## 2020-12-19 MED ORDER — CYANOCOBALAMIN 1000 MCG/ML IJ SOLN
1000.0000 ug | Freq: Once | INTRAMUSCULAR | Status: AC
  Administered 2020-12-19: 1000 ug via INTRAMUSCULAR

## 2020-12-24 ENCOUNTER — Encounter: Payer: Self-pay | Admitting: Gastroenterology

## 2021-01-03 DIAGNOSIS — E1169 Type 2 diabetes mellitus with other specified complication: Secondary | ICD-10-CM | POA: Diagnosis not present

## 2021-01-03 DIAGNOSIS — E782 Mixed hyperlipidemia: Secondary | ICD-10-CM | POA: Diagnosis not present

## 2021-01-03 DIAGNOSIS — L409 Psoriasis, unspecified: Secondary | ICD-10-CM | POA: Diagnosis not present

## 2021-01-03 DIAGNOSIS — Z23 Encounter for immunization: Secondary | ICD-10-CM | POA: Diagnosis not present

## 2021-01-03 DIAGNOSIS — I10 Essential (primary) hypertension: Secondary | ICD-10-CM | POA: Diagnosis not present

## 2021-01-03 DIAGNOSIS — I959 Hypotension, unspecified: Secondary | ICD-10-CM | POA: Diagnosis not present

## 2021-01-03 DIAGNOSIS — N183 Chronic kidney disease, stage 3 unspecified: Secondary | ICD-10-CM | POA: Diagnosis not present

## 2021-01-17 ENCOUNTER — Other Ambulatory Visit (INDEPENDENT_AMBULATORY_CARE_PROVIDER_SITE_OTHER): Payer: Medicare Other

## 2021-01-17 ENCOUNTER — Ambulatory Visit: Payer: Medicare Other

## 2021-01-17 ENCOUNTER — Other Ambulatory Visit: Payer: Self-pay

## 2021-01-17 DIAGNOSIS — E538 Deficiency of other specified B group vitamins: Secondary | ICD-10-CM

## 2021-01-17 MED ORDER — CYANOCOBALAMIN 1000 MCG/ML IJ SOLN
1000.0000 ug | Freq: Once | INTRAMUSCULAR | Status: AC
Start: 2021-01-17 — End: 2021-01-17
  Administered 2021-01-17: 1000 ug via INTRAMUSCULAR

## 2021-01-27 ENCOUNTER — Other Ambulatory Visit: Payer: Self-pay

## 2021-01-27 ENCOUNTER — Encounter: Payer: Self-pay | Admitting: Internal Medicine

## 2021-01-27 ENCOUNTER — Ambulatory Visit: Payer: Medicare Other | Admitting: Internal Medicine

## 2021-01-27 VITALS — BP 96/70 | HR 100 | Ht 66.0 in | Wt 166.0 lb

## 2021-01-27 DIAGNOSIS — E1165 Type 2 diabetes mellitus with hyperglycemia: Secondary | ICD-10-CM | POA: Diagnosis not present

## 2021-01-27 LAB — POCT GLYCOSYLATED HEMOGLOBIN (HGB A1C): Hemoglobin A1C: 4.9 % (ref 4.0–5.6)

## 2021-01-27 LAB — GLUCOSE, POCT (MANUAL RESULT ENTRY): POC Glucose: 102 mg/dl — AB (ref 70–99)

## 2021-01-27 MED ORDER — OZEMPIC (0.25 OR 0.5 MG/DOSE) 2 MG/1.5ML ~~LOC~~ SOPN: 0.2500 mg | PEN_INJECTOR | SUBCUTANEOUS | 6 refills | Status: DC

## 2021-01-27 NOTE — Progress Notes (Signed)
Name: Caroline White  Age/ Sex: 70 y.o., female   MRN/ DOB: [id number], 1950-12-08     PCP: Gaynelle Arabian, MD   Reason for Endocrinology Evaluation: Type 2 Diabetes Mellitus  Initial Endocrine Consultative Visit: 08/03/2019    PATIENT IDENTIFIER: [name] is a 70 y.o. female with a past medical history of T2DM, HTN , CVAand Dyslipidemia . The patient has followed with Endocrinology clinic since 08/03/2019 for consultative assistance with management of her diabetes.  DIABETIC HISTORY:  Caroline White was diagnosed with T2DM many years ago, Has been on oral glycemic agents since her diagnosis. No insulin in the past.    On her initial visit to our clinic she had an A1c of 9.5%. She was on metformin and Glimepiride , we increased Glimepiride    GLP-1 agonist started 10/2019   Glimepiride stopped 03/2020 due to low A1c of 5.3%  SUBJECTIVE:   During the last visit (07/24/2020): A1c 5.0 % . Decreased  Metformin and continued Ozempic       Today (01/27/2021): Caroline White is here for a follow up on diabetes management. She is accompanied by daughter in-law Caroline White.   She checks her blood sugars occasionally.The patient has not had any  hypoglycemic episodes since her last visit here.     Diarrhea has resolved  Denies nausea or vomiting  She has been off Metformin for 3 months  Stopped MVI     HOME DIABETES REGIMEN:  Metformin 500 mg, 1 tablets BID- not taking  Ozempic 0.5 mg weekly ( Sundays)     Statin: yes ACE-I/ARB: yes     METER DOWNLOAD SUMMARY: Did not bring      DIABETIC COMPLICATIONS: Microvascular complications:   Denies: CKD, retinopathy, neuropathy Last Eye Exam: Completed 10/2019  Macrovascular complications:  CVA Denies: CAD,  PVD   HISTORY:  Past Medical History:  Past Medical History:  Diagnosis Date   Diabetes mellitus    type II--no medications  diet control   History of kidney stones    "Passed"   Hyperlipidemia     Hypertension    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 Path confirmed intraductal papilloma    Nipple discharge    bloody from Lt breast   Psoriasis    Past Surgical History:  Past Surgical History:  Procedure Laterality Date   ABDOMINAL HYSTERECTOMY  1998   full   [place]  06/18/2011   Procedure: EXCISION DUCTAL SYSTEM BREAST;  Surgeon: Haywood Lasso, MD;  Location: [facility];  Service: General;  Laterality: Left;  Left Breast Ductal Excision   BREAST EXCISIONAL BIOPSY Left    COLONOSCOPY  11/19/2010   Mild pancolonic diverticuloisis. Small internal hemorrhoids. Otherwise normal colonoscopy to terminal ileum   nasal surgery [place]     Social History:  reports that she has never smoked. She has never used smokeless tobacco. She reports that she does not drink alcohol and does not use drugs. Family History:  Family History  Problem Relation Age of Onset   Cancer Mother        uterine and stomach   Stroke Father    Cancer Maternal Grandmother        breast   Breast cancer Maternal Grandmother      HOME MEDICATIONS: Allergies as of 01/27/2021       Reactions   Fish Oil    Heart  racing   Sulfa Drugs Cross Reactors Other (See Comments)   Unknown   Zestril [lisinopril] Cough        Medication List        Accurate as of January 27, 2021  3:21 PM. If you have any questions, ask your nurse or doctor.          amLODipine 10 MG tablet Commonly known as: NORVASC Take 10 mg by mouth daily.   B-12 Compliance Injection 1000 MCG/ML Kit Generic drug: Cyanocobalamin 1000 mcg daily for 1 week, then weekly for 1 month, then monthly for a year.   Easy Touch FlipLock Safety Syr 21G X 1-1/2" 3 ML Misc Generic drug: SYRINGE-NEEDLE (DISP) 3 ML Use a new needle for each injection   memantine 10 MG tablet Commonly known as: NAMENDA Take 1 tablet every night for 2  weeks, then increase to 1 tablet twice a day What changed:  how much to take how to take this when to take this additional instructions   metoprolol succinate 25 MG 24 hr tablet Commonly known as: TOPROL-XL Take 50 mg by mouth daily.   OneTouch Delica Lancets 54G Misc 1 Device by Does not apply route daily.   Ozempic (0.25 or 0.5 MG/DOSE) 2 MG/1.5ML Sopn Generic drug: Semaglutide(0.25 or 0.5MG/DOS) Inject 0.5 mg into the skin once a week.   rosuvastatin 20 MG tablet Commonly known as: CRESTOR 20 mg.   sharps container 1 each by Does not apply route as needed.   valsartan-hydrochlorothiazide 80-12.5 MG tablet Commonly known as: DIOVAN-HCT Take 1 tablet by mouth daily.         OBJECTIVE:   Vital Signs: BP 96/70 (BP Location: Left Arm, Patient Position: Sitting, Cuff Size: Small)   Pulse 100   Ht _0  (1.676 m)   Wt 166 lb (75.3 kg)   SpO2 94%   BMI 26.79 kg/m   Wt Readings from Last 3 Encounters:  01/27/21 166 lb (75.3 kg)  11/19/20 166 lb 9.6 oz (75.6 kg)  07/24/20 185 lb 8 oz (84.1 kg)     Exam: General: Pt appears well and is in NAD  Lungs: Clear with good BS bilat     Heart: RRR   Extremities: No pretibial edema.  Neuro: Pt with difficulty following commands      DM foot exam: 04/24/2020  The skin of the feet is intact without sores or ulcerations. The pedal pulses are 2+ on right and 2+ on left. The sensation is intact to a screening 5.07, 10 gram monofilament bilaterally    DATA REVIEWED:  01/03/2021 TG 159 HDL 33 LDL 56 Glucose 96 BUN 32 CR 1.43 GFR 39 CA 9.8 ASSESSMENT / PLAN / RECOMMENDATIONS:   1) Type 2 Diabetes Mellitus, Optimally  controlled, With CKD III complications - Most recent A1c of 4.9%. Goal A1c < 7.0 %.     - A1c continues to be low , she has been off Metformin , GFR 39  - Will reduce Ozempic as below and if A1c continues to be low, will discontinue -Family has been doing great in taking care of of Caroline White's  diabetes care.  - No meter today    MEDICATIONS:  - Decrease Ozempic 0.25 mg weekly     EDUCATION / INSTRUCTIONS: BG monitoring instructions: Patient is instructed to check her blood sugars 3 times a week Call [facility] if: BG persistently < 70  I reviewed the Rule of 15 for the treatment of hypoglycemia in  detail with the patient. Literature supplied.    2) Diabetic complications:  Eye: Does not have known diabetic retinopathy.  Neuro/ Feet: Does not have known diabetic peripheral neuropathy .  Renal: Patient does not have known baseline CKD. She   is  on an ACEI/ARB at present    F/U in 6 months    Signed electronically by: Mack Guise, MD  [facility]  [address] [address] [place], [place] [address] Phone: [phone] FAX: [phone]   CC: Gaynelle Arabian, MD [address] [place] [address] Phone: [phone]  Fax: [phone]  Return to Endocrinology clinic as below: Future Appointments  Date Time Provider [place]  02/17/2021  2:00 PM [facility] NURSE [facility] None  05/19/2021  3:30 PM Rondel Jumbo, PA-C [facility] None  11/25/2021  3:00 PM Cameron Sprang, MD [facility] None

## 2021-01-27 NOTE — Patient Instructions (Signed)
-   Decrease  Ozempic 0.25 mg Weekly       HOW TO TREAT LOW BLOOD SUGARS (Blood sugar LESS THAN 70 MG/DL) Please follow the RULE OF 15 for the treatment of hypoglycemia treatment (when your (blood sugars are less than 70 mg/dL)   STEP 1: Take 15 grams of carbohydrates when your blood sugar is low, which includes:  3-4 GLUCOSE TABS  OR 3-4 OZ OF JUICE OR REGULAR SODA OR ONE TUBE OF GLUCOSE GEL    STEP 2: RECHECK blood sugar in 15 MINUTES STEP 3: If your blood sugar is still low at the 15 minute recheck --> then, go back to STEP 1 and treat AGAIN with another 15 grams of carbohydrates.

## 2021-02-17 ENCOUNTER — Ambulatory Visit: Payer: Medicare Other

## 2021-02-18 ENCOUNTER — Ambulatory Visit: Payer: Medicare Other

## 2021-02-19 ENCOUNTER — Ambulatory Visit (INDEPENDENT_AMBULATORY_CARE_PROVIDER_SITE_OTHER): Payer: Medicare Other

## 2021-02-19 ENCOUNTER — Other Ambulatory Visit: Payer: Self-pay

## 2021-02-19 DIAGNOSIS — E538 Deficiency of other specified B group vitamins: Secondary | ICD-10-CM

## 2021-02-19 MED ORDER — CYANOCOBALAMIN 1000 MCG/ML IJ SOLN
1000.0000 ug | Freq: Once | INTRAMUSCULAR | Status: AC
  Administered 2021-02-19: 1000 ug via INTRAMUSCULAR

## 2021-03-21 ENCOUNTER — Other Ambulatory Visit: Payer: Self-pay

## 2021-03-21 ENCOUNTER — Ambulatory Visit (INDEPENDENT_AMBULATORY_CARE_PROVIDER_SITE_OTHER): Payer: Medicare Other

## 2021-03-21 DIAGNOSIS — E538 Deficiency of other specified B group vitamins: Secondary | ICD-10-CM

## 2021-03-21 MED ORDER — CYANOCOBALAMIN 1000 MCG/ML IJ SOLN
1000.0000 ug | Freq: Once | INTRAMUSCULAR | Status: AC
  Administered 2021-03-21: 1000 ug via INTRAMUSCULAR

## 2021-04-21 ENCOUNTER — Ambulatory Visit (INDEPENDENT_AMBULATORY_CARE_PROVIDER_SITE_OTHER): Payer: Medicare Other

## 2021-04-21 ENCOUNTER — Other Ambulatory Visit: Payer: Self-pay

## 2021-04-21 VITALS — Ht 66.0 in | Wt 166.0 lb

## 2021-04-21 DIAGNOSIS — E538 Deficiency of other specified B group vitamins: Secondary | ICD-10-CM | POA: Diagnosis not present

## 2021-04-21 MED ORDER — CYANOCOBALAMIN 1000 MCG/ML IJ SOLN
1000.0000 ug | Freq: Once | INTRAMUSCULAR | Status: AC
  Administered 2021-04-21: 16:00:00 1000 ug via INTRAMUSCULAR

## 2021-04-21 NOTE — Progress Notes (Signed)
Patient brings her own B-12 shot with her

## 2021-05-13 ENCOUNTER — Other Ambulatory Visit: Payer: Self-pay | Admitting: Neurology
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
## 2021-05-19 ENCOUNTER — Other Ambulatory Visit: Payer: Self-pay

## 2021-05-19 ENCOUNTER — Ambulatory Visit: Payer: Medicare Other | Admitting: Physician Assistant

## 2021-05-19 ENCOUNTER — Encounter: Payer: Self-pay | Admitting: Physician Assistant

## 2021-05-19 VITALS — BP 104/58 | HR 55 | Ht 66.0 in | Wt 168.0 lb

## 2021-05-19 DIAGNOSIS — F01518 Vascular dementia, unspecified severity, with other behavioral disturbance: Secondary | ICD-10-CM | POA: Diagnosis not present

## 2021-05-19 DIAGNOSIS — E538 Deficiency of other specified B group vitamins: Secondary | ICD-10-CM

## 2021-05-19 MED ORDER — MEMANTINE HCL 10 MG PO TABS: ORAL_TABLET | ORAL | 4 refills | Status: DC

## 2021-05-19 MED ORDER — CYANOCOBALAMIN 1000 MCG/ML IJ SOLN
1000.0000 ug | INTRAMUSCULAR | Status: DC
  Administered 2021-05-19: 1000 ug via INTRAMUSCULAR

## 2021-05-19 NOTE — Patient Instructions (Signed)
Good to see you. ? ?Continue Memantine '10mg'$  twice a day ? ?4. Continue close supervision ? ?5. Follow-up in 6 months, call for any changes ? ? ?FALL PRECAUTIONS: Be cautious when walking. Scan the area for obstacles that may increase the risk of trips and falls. When getting up in the mornings, sit up at the edge of the bed for a few minutes before getting out of bed. Consider elevating the bed at the head end to avoid drop of blood pressure when getting up. Walk always in a well-lit room (use night lights in the walls). Avoid area rugs or power cords from appliances in the middle of the walkways. Use a walker or a cane if necessary and consider physical therapy for balance exercise. Get your eyesight checked regularly. ? ?HOME SAFETY: Consider the safety of the kitchen when operating appliances like stoves, microwave oven, and blender. Consider having supervision and share cooking responsibilities until no longer able to participate in those. Accidents with firearms and other hazards in the house should be identified and addressed as well. ? ?ABILITY TO BE LEFT ALONE: If patient is unable to contact 911 operator, consider using LifeLine, or when the need is there, arrange for someone to stay with patients. Smoking is a fire hazard, consider supervision or cessation. Risk of wandering should be assessed by caregiver and if detected at any point, supervision and safe proof recommendations should be instituted. ? ?MEDICATION SUPERVISION: Inability to self-administer medication needs to be constantly addressed. Implement a mechanism to ensure safe administration of the medications. ? ?RECOMMENDATIONS FOR ALL PATIENTS WITH MEMORY PROBLEMS: ?1. Continue to exercise (Recommend 30 minutes of walking everyday, or 3 hours every week) ?2. Increase social interactions - continue going to [place] and enjoy social gatherings with friends and family ?3. Eat healthy, avoid fried foods and eat more fruits and vegetables ?4. Maintain  adequate blood pressure, blood sugar, and blood cholesterol level. Reducing the risk of stroke and cardiovascular disease also helps promoting better memory. ?5. Avoid stressful situations. Live a simple life and avoid aggravations. Organize your time and prepare for the next day in anticipation. ?6. Sleep well, avoid any interruptions of sleep and avoid any distractions in the bedroom that may interfere with adequate sleep quality ?7. Avoid sugar, avoid sweets as there is a strong link between excessive sugar intake, diabetes, and cognitive impairment ?The Mediterranean diet has been shown to help patients reduce the risk of progressive memory disorders and reduces cardiovascular risk. This includes eating fish, eat fruits and green leafy vegetables, nuts like almonds and hazelnuts, walnuts, and also use olive oil. Avoid fast foods and fried foods as much as possible. Avoid sweets and sugar as sugar use has been linked to worsening of memory function. ? ?There is always a concern of gradual progression of memory problems. If this is the case, then we may need to adjust level of care according to patient needs. Support, both to the patient and caregiver, should then be put into place. ? ?

## 2021-06-09 ENCOUNTER — Other Ambulatory Visit: Payer: Self-pay | Admitting: Neurology

## 2021-07-07 ENCOUNTER — Other Ambulatory Visit: Payer: Self-pay | Admitting: Neurology

## 2021-07-09 DIAGNOSIS — Z1389 Encounter for screening for other disorder: Secondary | ICD-10-CM | POA: Diagnosis not present

## 2021-07-09 DIAGNOSIS — E1169 Type 2 diabetes mellitus with other specified complication: Secondary | ICD-10-CM | POA: Diagnosis not present

## 2021-07-09 DIAGNOSIS — Z0000 Encounter for general adult medical examination without abnormal findings: Secondary | ICD-10-CM | POA: Diagnosis not present

## 2021-07-09 DIAGNOSIS — I10 Essential (primary) hypertension: Secondary | ICD-10-CM | POA: Diagnosis not present

## 2021-07-09 DIAGNOSIS — N183 Chronic kidney disease, stage 3 unspecified: Secondary | ICD-10-CM | POA: Diagnosis not present

## 2021-07-09 DIAGNOSIS — I700 Atherosclerosis of aorta: Secondary | ICD-10-CM | POA: Diagnosis not present

## 2021-07-09 DIAGNOSIS — E782 Mixed hyperlipidemia: Secondary | ICD-10-CM | POA: Diagnosis not present

## 2021-07-09 DIAGNOSIS — L409 Psoriasis, unspecified: Secondary | ICD-10-CM | POA: Diagnosis not present

## 2021-07-29 ENCOUNTER — Ambulatory Visit: Payer: Medicare Other | Admitting: Internal Medicine

## 2021-07-31 ENCOUNTER — Ambulatory Visit: Payer: Medicare Other | Admitting: Internal Medicine

## 2021-11-06 NOTE — Progress Notes (Signed)
Name: Caroline White  Age/ Sex: 71 y.o., female   MRN/ DOB: [id number], [birth year]     PCP: Gaynelle Arabian, MD   Reason for Endocrinology Evaluation: Type 2 Diabetes Mellitus  Initial Endocrine Consultative Visit: 08/03/2019    PATIENT IDENTIFIER: [name] is a 71 y.o. female with a past medical history of T2DM, HTN , CVAand Dyslipidemia . The patient has followed with Endocrinology clinic since 08/03/2019 for consultative assistance with management of her diabetes.  DIABETIC HISTORY:  [name] was diagnosed with T2DM many years ago, Has been on oral glycemic agents since her diagnosis. No insulin in the past.    On her initial visit to our clinic she had an A1c of 9.5%. She was on metformin and Glimepiride , we increased Glimepiride    GLP-1 agonist started 10/2019   Glimepiride stopped 03/2020 due to low A1c of 5.3%   Metformin stopped August 2022    SUBJECTIVE:   During the last visit (01/27/2021): A1c 4.9 % . Decreased  Ozempic       Today (11/07/2021): [name] is here for a follow up on diabetes management. She is accompanied by daughter in-law Caroline White.   She checks her blood sugars occasionally.The patient has not had any  hypoglycemic episodes since her last visit here.   She has not had Ozempic since November, 2022   Family has dramatic in her diet and drinks   Denies nausea, vomiting, has rare loose stools    HOME DIABETES REGIMEN:   Ozempic 0.25 mg weekly ( Sundays) - not taking    METER DOWNLOAD SUMMARY: 8/12-8/25/2023 Fingerstick Blood Glucose Tests = 2 Overall Mean FS Glucose = 92 Standard Deviation = 6  BG Ranges: Low = 88 High = 96   Hypoglycemic Events/30 Days: BG < 50 = 0 Episodes of symptomatic severe hypoglycemia = 0    Statin: yes ACE-I/ARB: yes   DIABETIC COMPLICATIONS: Microvascular complications:   Denies: CKD, retinopathy, neuropathy Last Eye Exam: Completed 10/2019  Macrovascular  complications:  CVA Denies: CAD,  PVD   HISTORY:  Past Medical History:  Past Medical History:  Diagnosis Date   Diabetes mellitus    type II--no medications  diet control   History of kidney stones    "Passed"   Hyperlipidemia    Hypertension    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 Path confirmed intraductal papilloma    Nipple discharge    bloody from Lt breast   Psoriasis    Past Surgical History:  Past Surgical History:  Procedure Laterality Date   ABDOMINAL HYSTERECTOMY  1998   full   [place]  06/18/2011   Procedure: EXCISION DUCTAL SYSTEM BREAST;  Surgeon: Haywood Lasso, MD;  Location: [facility];  Service: General;  Laterality: Left;  Left Breast Ductal Excision   BREAST EXCISIONAL BIOPSY Left    COLONOSCOPY  11/19/2010   Mild pancolonic diverticuloisis. Small internal hemorrhoids. Otherwise normal colonoscopy to terminal ileum   nasal surgery [place]     Social History:  reports that she has never smoked. She has never used smokeless tobacco. She reports that she does not drink alcohol and does not use drugs. Family History:  Family History  Problem Relation Age of Onset   Cancer Mother        uterine and stomach   Stroke Father    Cancer Maternal Grandmother  breast   Breast cancer Maternal Grandmother      HOME MEDICATIONS: Allergies as of 11/07/2021       Reactions   Fish Oil    Heart racing   Sulfa Antibiotics Other (See Comments)   Sulfa Drugs Cross Reactors Other (See Comments)   Unknown   Zestril [lisinopril] Cough        Medication List        Accurate as of November 07, 2021 12:06 PM. If you have any questions, ask your nurse or doctor.          STOP taking these medications    amLODipine 10 MG tablet Commonly known as: NORVASC Stopped by: Dorita Sciara, MD   B-12 Compliance Injection 1000 MCG/ML Kit Generic  drug: Cyanocobalamin Stopped by: Dorita Sciara, MD   Easy Touch FlipLock Safety Syr 21G X 1-1/2" 3 ML Misc Generic drug: SYRINGE-NEEDLE (DISP) 3 ML Stopped by: Dorita Sciara, MD   OneTouch Delica Lancets 81K Misc Stopped by: Dorita Sciara, MD   Ozempic (0.25 or 0.5 MG/DOSE) 2 MG/1.5ML Sopn Generic drug: Semaglutide(0.25 or 0.5MG/DOS) Stopped by: Dorita Sciara, MD   sharps container Stopped by: Dorita Sciara, MD       TAKE these medications    aspirin 81 MG chewable tablet 1 tablet   memantine 10 MG tablet Commonly known as: NAMENDA TAKE  1 TABLET TWICE A DAY   metoprolol succinate 25 MG 24 hr tablet Commonly known as: TOPROL-XL Take 50 mg by mouth daily.   rosuvastatin 20 MG tablet Commonly known as: CRESTOR 20 mg.   valsartan-hydrochlorothiazide 80-12.5 MG tablet Commonly known as: DIOVAN-HCT Take 1 tablet by mouth daily. 1/2 tab po qd         OBJECTIVE:   Vital Signs: BP 104/66 (BP Location: Left Arm, Patient Position: Sitting, Cuff Size: Normal)   Pulse 61   Ht _0  (1.676 m)   Wt 178 lb 3.2 oz (80.8 kg)   SpO2 99%   BMI 28.76 kg/m   Wt Readings from Last 3 Encounters:  11/07/21 178 lb 3.2 oz (80.8 kg)  05/19/21 168 lb (76.2 kg)  04/21/21 166 lb (75.3 kg)     Exam: General: Pt appears well and is in NAD  Lungs: Clear with good BS bilat     Heart: RRR   Extremities: No pretibial edema.  Neuro: Pt with difficulty following commands      DM foot exam: 11/07/2021 The skin of the feet is intact without sores or ulcerations. The pedal pulses are 2+ on right and 2+ on left. Unable to perform monofilament testing as the patient was not following verbal commands    DATA REVIEWED: 07/09/2021 Calcium 9.5 Sodium 138 Potassium 4.4 BUN 21 CR 1.16 GFR 51 HDL 37 LDL 59 TG 229 ASSESSMENT / PLAN / RECOMMENDATIONS:   1) Type 2 Diabetes Mellitus, in remission, With CKD III complications - Most recent A1c of  5.2%. Goal A1c < 7.0 %.    -A1c is optimal, she has been off Ozempic and any glycemic agent since November 2022 -Family has been doing great in taking care of of [name]'s diabetes care.  -Patient's care will be deferred to her PCP as her glucose is optimal   EDUCATION / INSTRUCTIONS: BG monitoring instructions: Patient is instructed to check her blood sugars 3 times a week   Recommend continuation of current Tx with primary MD and consultative f/u at [facility] in the  future if pt's DM control becomes problematic.    Signed electronically by: Mack Guise, MD  [facility]  [facility] [address] [place], [place] [address] Phone: [phone] FAX: [phone]   CC: Gaynelle Arabian, MD [address] [place] [address] Phone: [phone]  Fax: [phone]  Return to Endocrinology clinic as below: Future Appointments  Date Time Provider [place]  11/19/2021  3:00 PM Rondel Jumbo, PA-C [facility] None

## 2021-11-07 ENCOUNTER — Encounter: Payer: Self-pay | Admitting: Internal Medicine

## 2021-11-07 ENCOUNTER — Ambulatory Visit: Payer: Medicare Other | Admitting: Internal Medicine

## 2021-11-07 VITALS — BP 104/66 | HR 61 | Ht 66.0 in | Wt 178.2 lb

## 2021-11-07 DIAGNOSIS — R809 Proteinuria, unspecified: Secondary | ICD-10-CM

## 2021-11-07 DIAGNOSIS — E1129 Type 2 diabetes mellitus with other diabetic kidney complication: Secondary | ICD-10-CM | POA: Diagnosis not present

## 2021-11-07 LAB — POCT GLYCOSYLATED HEMOGLOBIN (HGB A1C): Hemoglobin A1C: 5.2 % (ref 4.0–5.6)

## 2021-11-07 NOTE — Patient Instructions (Signed)
Keep Up the Good Work .   I will return your care to your Primary care physician as your sugar looks great without any medications

## 2021-11-19 ENCOUNTER — Ambulatory Visit: Payer: Medicare Other | Admitting: Physician Assistant

## 2021-11-19 ENCOUNTER — Encounter: Payer: Self-pay | Admitting: Physician Assistant

## 2021-11-19 VITALS — BP 119/69 | HR 100 | Resp 18 | Wt 179.0 lb

## 2021-11-19 DIAGNOSIS — F01518 Vascular dementia, unspecified severity, with other behavioral disturbance: Secondary | ICD-10-CM

## 2021-11-19 NOTE — Patient Instructions (Signed)
It was a pleasure to see you today at our office.   Recommendations:  Follow up in 6  months  Continue Memantine 10 mg twice daily.Side effects were discussed   Whom to call:  Memory  decline, memory medications: Call our office [phone]   For psychiatric meds, mood meds: Please have your primary care physician manage these medications.   Counseling regarding caregiver distress, including caregiver depression, anxiety and issues regarding community resources, adult day care programs, adult living facilities, or memory care questions:   Feel free to contact Misty Taylor Palladino, Social Worker at [phone]   For assessment of decision of mental capacity and competency:  Call Dr. Michelle Haber, geriatric psychiatrist at [phone]  For guidance in geriatric dementia issues please call [facility] [phone]  For guidance regarding [facility] and if placement were needed at the facility, contact Nicole Reynolds, Social Worker tel: [phone]  If you have any severe symptoms of a stroke, or other severe issues such as confusion,severe chills or fever, etc call 911 or go to the ER as you may need to be evaluated further   Feel free to visit Facebook page " [email or web address]" for tips of how to care for people with memory problems.       RECOMMENDATIONS FOR ALL PATIENTS WITH MEMORY PROBLEMS: 1. Continue to exercise (Recommend 30 minutes of walking everyday, or 3 hours every week) 2. Increase social interactions - continue going to [employer] and enjoy social gatherings with friends and family 3. Eat healthy, avoid fried foods and eat more fruits and vegetables 4. Maintain adequate blood pressure, blood sugar, and blood cholesterol level. Reducing the risk of stroke and cardiovascular disease also helps promoting better memory. 5. Avoid stressful situations. Live a simple life and avoid aggravations. Organize your time and prepare for the next day in  anticipation. 6. Sleep well, avoid any interruptions of sleep and avoid any distractions in the bedroom that may interfere with adequate sleep quality 7. Avoid sugar, avoid sweets as there is a strong link between excessive sugar intake, diabetes, and cognitive impairment We discussed the Mediterranean diet, which has been shown to help patients reduce the risk of progressive memory disorders and reduces cardiovascular risk. This includes eating fish, eat fruits and green leafy vegetables, nuts like almonds and hazelnuts, walnuts, and also use olive oil. Avoid fast foods and fried foods as much as possible. Avoid sweets and sugar as sugar use has been linked to worsening of memory function.  There is always a concern of gradual progression of memory problems. If this is the case, then we may need to adjust level of care according to patient needs. Support, both to the patient and caregiver, should then be put into place.    The Alzheimer's Association is here all day, every day for people facing Alzheimer's disease through our free 24/7 Helpline: [phone]. The Helpline provides reliable information and support to all those who need assistance, such as individuals living with memory loss, Alzheimer's or other dementia, caregivers, health care professionals and the public.  Our highly trained and knowledgeable staff can help you with: Understanding memory loss, dementia and Alzheimer's  Medications and other treatment options  General information about aging and brain health  Skills to provide quality care and to find the best care from professionals  Legal, financial and living-arrangement decisions Our Helpline also features: Confidential care consultation provided by master's level clinicians who can help with decision-making support, crisis assistance and education on   issues families face every day  Help in a caller's preferred language using our translation service that features more than 200  languages and dialects  Referrals to local community programs, services and ongoing support     FALL PRECAUTIONS: Be cautious when walking. Scan the area for obstacles that may increase the risk of trips and falls. When getting up in the mornings, sit up at the edge of the bed for a few minutes before getting out of bed. Consider elevating the bed at the head end to avoid drop of blood pressure when getting up. Walk always in a well-lit room (use night lights in the walls). Avoid area rugs or power cords from appliances in the middle of the walkways. Use a walker or a cane if necessary and consider physical therapy for balance exercise. Get your eyesight checked regularly.  FINANCIAL OVERSIGHT: Supervision, especially oversight when making financial decisions or transactions is also recommended.  HOME SAFETY: Consider the safety of the kitchen when operating appliances like stoves, microwave oven, and blender. Consider having supervision and share cooking responsibilities until no longer able to participate in those. Accidents with firearms and other hazards in the house should be identified and addressed as well.   ABILITY TO BE LEFT ALONE: If patient is unable to contact 911 operator, consider using LifeLine, or when the need is there, arrange for someone to stay with patients. Smoking is a fire hazard, consider supervision or cessation. Risk of wandering should be assessed by caregiver and if detected at any point, supervision and safe proof recommendations should be instituted.  MEDICATION SUPERVISION: Inability to self-administer medication needs to be constantly addressed. Implement a mechanism to ensure safe administration of the medications.   DRIVING: Regarding driving, in patients with progressive memory problems, driving will be impaired. We advise to have someone else do the driving if trouble finding directions or if minor accidents are reported. Independent driving assessment is  available to determine safety of driving.   If you are interested in the driving assessment, you can contact the following:  [employer] in [place] [phone]  [facility] [phone]  [facility] [phone]  [facility] [phone] or [phone]      Mediterranean Diet A Mediterranean diet refers to food and lifestyle choices that are based on the traditions of countries located on the [place] Sea. This way of eating has been shown to help prevent certain conditions and improve outcomes for people who have chronic diseases, like kidney disease and heart disease. What are tips for following this plan? Lifestyle  Cook and eat meals together with your family, when possible. Drink enough fluid to keep your urine clear or pale yellow. Be physically active every day. This includes: Aerobic exercise like running or swimming. Leisure activities like gardening, walking, or housework. Get 7-8 hours of sleep each night. If recommended by your health care provider, drink red wine in moderation. This means 1 glass a day for nonpregnant women and 2 glasses a day for men. A glass of wine equals 5 oz (150 mL). Reading food labels  Check the serving size of packaged foods. For foods such as rice and pasta, the serving size refers to the amount of cooked product, not dry. Check the total fat in packaged foods. Avoid foods that have saturated fat or trans fats. Check the ingredients list for added sugars, such as corn syrup. Shopping  At the grocery store, buy most of your food from the areas near the   walls of the store. This includes: Fresh fruits and vegetables (produce). Grains, beans, nuts, and seeds. Some of these may be available in unpackaged forms or large amounts (in bulk). Fresh seafood. Poultry and eggs. Low-fat dairy products. Buy whole ingredients instead of prepackaged foods. Buy fresh fruits and vegetables in-season  from local farmers markets. Buy frozen fruits and vegetables in resealable bags. If you do not have access to quality fresh seafood, buy precooked frozen shrimp or canned fish, such as tuna, salmon, or sardines. Buy small amounts of raw or cooked vegetables, salads, or olives from the deli or salad bar at your store. Stock your pantry so you always have certain foods on hand, such as olive oil, canned tuna, canned tomatoes, rice, pasta, and beans. Cooking  Cook foods with extra-virgin olive oil instead of using butter or other vegetable oils. Have meat as a side dish, and have vegetables or grains as your main dish. This means having meat in small portions or adding small amounts of meat to foods like pasta or stew. Use beans or vegetables instead of meat in common dishes like chili or lasagna. Experiment with different cooking methods. Try roasting or broiling vegetables instead of steaming or sauteing them. Add frozen vegetables to soups, stews, pasta, or rice. Add nuts or seeds for added healthy fat at each meal. You can add these to yogurt, salads, or vegetable dishes. Marinate fish or vegetables using olive oil, lemon juice, garlic, and fresh herbs. Meal planning  Plan to eat 1 vegetarian meal one day each week. Try to work up to 2 vegetarian meals, if possible. Eat seafood 2 or more times a week. Have healthy snacks readily available, such as: Vegetable sticks with hummus. Greek yogurt. Fruit and nut trail mix. Eat balanced meals throughout the week. This includes: Fruit: 2-3 servings a day Vegetables: 4-5 servings a day Low-fat dairy: 2 servings a day Fish, poultry, or lean meat: 1 serving a day Beans and legumes: 2 or more servings a week Nuts and seeds: 1-2 servings a day Whole grains: 6-8 servings a day Extra-virgin olive oil: 3-4 servings a day Limit red meat and sweets to only a few servings a month What are my food choices? Mediterranean diet Recommended Grains:  Whole-grain pasta. Brown rice. Bulgar wheat. Polenta. Couscous. Whole-wheat bread. Oatmeal. Quinoa. Vegetables: Artichokes. Beets. Broccoli. Cabbage. Carrots. Eggplant. Green beans. Chard. Kale. Spinach. Onions. Leeks. Peas. Squash. Tomatoes. Peppers. Radishes. Fruits: Apples. Apricots. Avocado. Berries. Bananas. Cherries. Dates. Figs. Grapes. Lemons. Melon. Oranges. Peaches. Plums. Pomegranate. Meats and other protein foods: Beans. Almonds. Sunflower seeds. Pine nuts. Peanuts. Cod. Salmon. Scallops. Shrimp. Tuna. Tilapia. Clams. Oysters. Eggs. Dairy: Low-fat milk. Cheese. Greek yogurt. Beverages: Water. Red wine. Herbal tea. Fats and oils: Extra virgin olive oil. Avocado oil. Grape seed oil. Sweets and desserts: Greek yogurt with honey. Baked apples. Poached pears. Trail mix. Seasoning and other foods: Basil. Cilantro. Coriander. Cumin. Mint. Parsley. Sage. Rosemary. Tarragon. Garlic. Oregano. Thyme. Pepper. Balsalmic vinegar. Tahini. Hummus. Tomato sauce. Olives. Mushrooms. Limit these Grains: Prepackaged pasta or rice dishes. Prepackaged cereal with added sugar. Vegetables: Deep fried potatoes (french fries). Fruits: Fruit canned in syrup. Meats and other protein foods: Beef. Pork. Lamb. Poultry with skin. Hot dogs. Bacon. Dairy: Ice cream. Sour cream. Whole milk. Beverages: Juice. Sugar-sweetened soft drinks. Beer. Liquor and spirits. Fats and oils: Butter. Canola oil. Vegetable oil. Beef fat (tallow). Lard. Sweets and desserts: Cookies. Cakes. Pies. Candy. Seasoning and other foods: Mayonnaise. Premade sauces and marinades. The items   listed may not be a complete list. Talk with your dietitian about what dietary choices are right for you. Summary The Mediterranean diet includes both food and lifestyle choices. Eat a variety of fresh fruits and vegetables, beans, nuts, seeds, and whole grains. Limit the amount of red meat and sweets that you eat. Talk with your health care provider about  whether it is safe for you to drink red wine in moderation. This means 1 glass a day for nonpregnant women and 2 glasses a day for men. A glass of wine equals 5 oz (150 mL). This information is not intended to replace advice given to you by your health care provider. Make sure you discuss any questions you have with your health care provider. Document Released: 10/24/2015 Document Revised: 11/26/2015 Document Reviewed: 10/24/2015 Elsevier Interactive Patient Education  2017 [employer]     
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
## 2021-11-25 ENCOUNTER — Ambulatory Visit: Payer: Medicare Other | Admitting: Neurology

## 2022-01-08 DIAGNOSIS — E119 Type 2 diabetes mellitus without complications: Secondary | ICD-10-CM | POA: Diagnosis not present

## 2022-01-08 DIAGNOSIS — E782 Mixed hyperlipidemia: Secondary | ICD-10-CM | POA: Diagnosis not present

## 2022-01-08 DIAGNOSIS — L409 Psoriasis, unspecified: Secondary | ICD-10-CM | POA: Diagnosis not present

## 2022-01-08 DIAGNOSIS — I10 Essential (primary) hypertension: Secondary | ICD-10-CM | POA: Diagnosis not present

## 2022-01-08 DIAGNOSIS — Z23 Encounter for immunization: Secondary | ICD-10-CM | POA: Diagnosis not present

## 2022-01-08 DIAGNOSIS — N183 Chronic kidney disease, stage 3 unspecified: Secondary | ICD-10-CM | POA: Diagnosis not present

## 2022-01-08 DIAGNOSIS — I700 Atherosclerosis of aorta: Secondary | ICD-10-CM | POA: Diagnosis not present

## 2022-05-20 ENCOUNTER — Encounter: Payer: Self-pay | Admitting: Physician Assistant

## 2022-05-20 ENCOUNTER — Ambulatory Visit: Payer: Medicare Other | Admitting: Physician Assistant

## 2022-05-20 VITALS — BP 133/73 | HR 68 | Resp 18 | Ht 66.0 in | Wt 189.0 lb

## 2022-05-20 DIAGNOSIS — F01518 Vascular dementia, unspecified severity, with other behavioral disturbance: Secondary | ICD-10-CM | POA: Diagnosis not present
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
## 2022-05-20 NOTE — Patient Instructions (Signed)
It was a pleasure to see you today at our office.   Recommendations:  Follow up in  6 months Continue Memantine 10 mg twice daily.Side effects were discussed    For assessment of decision of mental capacity and competency:  Call Dr. Anthoney Harada, geriatric psychiatrist at [phone]   Feel free to visit Facebook page " [email or web address]" for tips of how to care for people with memory problems.   Whom to call:  Memory  decline, memory medications: Call our office [phone]   For psychiatric meds, mood meds: Please have your primary care physician manage these medications.     If you have any severe symptoms of a stroke, or other severe issues such as confusion,severe chills or fever, etc call 911 or go to the ER as you may need to be evaluated further     RECOMMENDATIONS FOR ALL PATIENTS WITH MEMORY PROBLEMS: 1. Continue to exercise (Recommend 30 minutes of walking everyday, or 3 hours every week) 2. Increase social interactions - continue going to [place] and enjoy social gatherings with friends and family 3. Eat healthy, avoid fried foods and eat more fruits and vegetables 4. Maintain adequate blood pressure, blood sugar, and blood cholesterol level. Reducing the risk of stroke and cardiovascular disease also helps promoting better memory. 5. Avoid stressful situations. Live a simple life and avoid aggravations. Organize your time and prepare for the next day in anticipation. 6. Sleep well, avoid any interruptions of sleep and avoid any distractions in the bedroom that may interfere with adequate sleep quality 7. Avoid sugar, avoid sweets as there is a strong link between excessive sugar intake, diabetes, and cognitive impairment We discussed the Mediterranean diet, which has been shown to help patients reduce the risk of progressive memory disorders and reduces cardiovascular risk. This includes eating fish, eat fruits and green leafy vegetables, nuts like almonds and hazelnuts, walnuts,  and also use olive oil. Avoid fast foods and fried foods as much as possible. Avoid sweets and sugar as sugar use has been linked to worsening of memory function.  There is always a concern of gradual progression of memory problems. If this is the case, then we may need to adjust level of care according to patient needs. Support, both to the patient and caregiver, should then be put into place.    The Alzheimer's Association is here all day, every day for people facing Alzheimer's disease through our free 24/7 Helpline: [phone]. The Helpline provides reliable information and support to all those who need assistance, such as individuals living with memory loss, Alzheimer's or other dementia, caregivers, health care professionals and the public.  Our highly trained and knowledgeable staff can help you with: Understanding memory loss, dementia and Alzheimer's  Medications and other treatment options  General information about aging and brain health  Skills to provide quality care and to find the best care from professionals  Legal, financial and living-arrangement decisions Our Helpline also features: Confidential care consultation provided by master's level clinicians who can help with decision-making support, crisis assistance and education on issues families face every day  Help in a caller's preferred language using our translation service that features more than 200 languages and dialects  Referrals to local community programs, services and ongoing support     FALL PRECAUTIONS: Be cautious when walking. Scan the area for obstacles that may increase the risk of trips and falls. When getting up in the mornings, sit up at the edge of the bed for a  few minutes before getting out of bed. Consider elevating the bed at the head end to avoid drop of blood pressure when getting up. Walk always in a well-lit room (use night lights in the walls). Avoid area rugs or power cords from appliances in the  middle of the walkways. Use a walker or a cane if necessary and consider physical therapy for balance exercise. Get your eyesight checked regularly.  FINANCIAL OVERSIGHT: Supervision, especially oversight when making financial decisions or transactions is also recommended.  HOME SAFETY: Consider the safety of the kitchen when operating appliances like stoves, microwave oven, and blender. Consider having supervision and share cooking responsibilities until no longer able to participate in those. Accidents with firearms and other hazards in the house should be identified and addressed as well.   ABILITY TO BE LEFT ALONE: If patient is unable to contact 911 operator, consider using LifeLine, or when the need is there, arrange for someone to stay with patients. Smoking is a fire hazard, consider supervision or cessation. Risk of wandering should be assessed by caregiver and if detected at any point, supervision and safe proof recommendations should be instituted.  MEDICATION SUPERVISION: Inability to self-administer medication needs to be constantly addressed. Implement a mechanism to ensure safe administration of the medications.   DRIVING: Regarding driving, in patients with progressive memory problems, driving will be impaired. We advise to have someone else do the driving if trouble finding directions or if minor accidents are reported. Independent driving assessment is available to determine safety of driving.   If you are interested in the driving assessment, you can contact the following:  [employer] in [place]  [place] [phone] or [phone]      [place] refers to food and lifestyle choices that are based on the traditions of countries located on the [employer]. This way of eating has been shown to help prevent certain  conditions and improve outcomes for people who have chronic diseases, like kidney disease and heart disease. What are tips for following this plan? Lifestyle  Cook and eat meals together with your family, when possible. Drink enough fluid to keep your urine clear or pale yellow. Be physically active every day. This includes: Aerobic exercise like running or swimming. Leisure activities like gardening, walking, or housework. Get 7-8 hours of sleep each night. If recommended by your health care provider, drink red wine in moderation. This means 1 glass a day for nonpregnant women and 2 glasses a day for men. A glass of wine equals 5 oz (150 mL). Reading food labels  Check the serving size of packaged foods. For foods such as rice and pasta, the serving size refers to the amount of cooked product, not dry. Check the total fat in packaged foods. Avoid foods that have saturated fat or trans fats. Check the ingredients list for added sugars, such as corn syrup. Shopping  At the grocery store, buy most of your food from the areas near the walls of the store. This includes: Fresh fruits and vegetables (produce). Grains, beans, nuts, and seeds. Some of these may be available in unpackaged forms or large amounts (in bulk). Fresh seafood. Poultry and eggs. Low-fat dairy products. Buy whole ingredients instead of prepackaged foods. Buy fresh fruits and vegetables in-season from local farmers markets. Buy frozen fruits and vegetables in resealable bags. If you do not have access to quality fresh seafood, buy  precooked frozen shrimp or canned fish, such as tuna, salmon, or sardines. Buy small amounts of raw or cooked vegetables, salads, or olives from the deli or salad bar at your store. Stock your pantry so you always have certain foods on hand, such as olive oil, canned tuna, canned tomatoes, rice, pasta, and beans. Cooking  Cook foods with extra-virgin olive oil instead of using butter or other  vegetable oils. Have meat as a side dish, and have vegetables or grains as your main dish. This means having meat in small portions or adding small amounts of meat to foods like pasta or stew. Use beans or vegetables instead of meat in common dishes like chili or lasagna. Experiment with different cooking methods. Try roasting or broiling vegetables instead of steaming or sauteing them. Add frozen vegetables to soups, stews, pasta, or rice. Add nuts or seeds for added healthy fat at each meal. You can add these to yogurt, salads, or vegetable dishes. Marinate fish or vegetables using olive oil, lemon juice, garlic, and fresh herbs. Meal planning  Plan to eat 1 vegetarian meal one day each week. Try to work up to 2 vegetarian meals, if possible. Eat seafood 2 or more times a week. Have healthy snacks readily available, such as: Vegetable sticks with hummus. Greek yogurt. Fruit and nut trail mix. Eat balanced meals throughout the week. This includes: Fruit: 2-3 servings a day Vegetables: 4-5 servings a day Low-fat dairy: 2 servings a day Fish, poultry, or lean meat: 1 serving a day Beans and legumes: 2 or more servings a week Nuts and seeds: 1-2 servings a day Whole grains: 6-8 servings a day Extra-virgin olive oil: 3-4 servings a day Limit red meat and sweets to only a few servings a month What are my food choices? Mediterranean diet Recommended Grains: Whole-grain pasta. Brown rice. Bulgar wheat. Polenta. Couscous. Whole-wheat bread. [place]. Vegetables: Artichokes. Beets. Broccoli. Cabbage. Carrots. Eggplant. Green beans. Chard. Kale. Spinach. Onions. Leeks. Peas. Squash. Tomatoes. Peppers. Radishes. Fruits: Apples. Apricots. Avocado. Berries. Bananas. Cherries. Dates. Figs. Grapes. Lemons. Melon. Oranges. Peaches. Plums. Pomegranate. Meats and other protein foods: Beans. Almonds. Sunflower seeds. Pine nuts. Peanuts. [place]. Salmon. Scallops. Shrimp. [place]. Tilapia. Clams.  Oysters. Eggs. Dairy: Low-fat milk. Cheese. Greek yogurt. Beverages: Water. Red wine. Herbal tea. Fats and oils: Extra virgin olive oil. Avocado oil. Grape seed oil. Sweets and desserts: [place] yogurt with honey. Baked apples. Poached pears. Trail mix. Seasoning and other foods: Basil. Cilantro. Coriander. Cumin. Mint. Parsley. Sage. Rosemary. Tarragon. Garlic. Oregano. Thyme. Pepper. Balsalmic vinegar. Tahini. Hummus. Tomato sauce. Olives. Mushrooms. Limit these Grains: Prepackaged pasta or rice dishes. Prepackaged cereal with added sugar. Vegetables: Deep fried potatoes (french fries). Fruits: Fruit canned in syrup. Meats and other protein foods: Beef. Pork. Lamb. Poultry with skin. Hot dogs. [place] Salines. Dairy: Ice cream. Sour cream. Whole milk. Beverages: Juice. Sugar-sweetened soft drinks. Beer. Liquor and spirits. Fats and oils: Butter. Canola oil. Vegetable oil. Beef fat (tallow). Lard. Sweets and desserts: Cookies. Cakes. Pies. Candy. Seasoning and other foods: Mayonnaise. Premade sauces and marinades. The items listed may not be a complete list. Talk with your dietitian about what dietary choices are right for you. Summary The Mediterranean diet includes both food and lifestyle choices. Eat a variety of fresh fruits and vegetables, beans, nuts, seeds, and whole grains. Limit the amount of red meat and sweets that you eat. Talk with your health care provider about whether it is safe for you to drink red wine in moderation. This  means 1 glass a day for nonpregnant women and 2 glasses a day for men. A glass of wine equals 5 oz (150 mL). This information is not intended to replace advice given to you by your health care provider. Make sure you discuss any questions you have with your health care provider. Document Released: 10/24/2015 Document Revised: 11/26/2015 Document Reviewed: 10/24/2015 Elsevier Interactive Patient Education  2017 [employer].

## 2022-06-10 ENCOUNTER — Other Ambulatory Visit: Payer: Self-pay | Admitting: Physician Assistant

## 2022-08-19 DIAGNOSIS — E782 Mixed hyperlipidemia: Secondary | ICD-10-CM | POA: Diagnosis not present

## 2022-08-19 DIAGNOSIS — Z0000 Encounter for general adult medical examination without abnormal findings: Secondary | ICD-10-CM | POA: Diagnosis not present

## 2022-08-19 DIAGNOSIS — E119 Type 2 diabetes mellitus without complications: Secondary | ICD-10-CM | POA: Diagnosis not present

## 2022-08-19 DIAGNOSIS — I10 Essential (primary) hypertension: Secondary | ICD-10-CM | POA: Diagnosis not present

## 2022-08-19 DIAGNOSIS — N183 Chronic kidney disease, stage 3 unspecified: Secondary | ICD-10-CM | POA: Diagnosis not present

## 2022-08-19 DIAGNOSIS — I700 Atherosclerosis of aorta: Secondary | ICD-10-CM | POA: Diagnosis not present

## 2022-08-19 DIAGNOSIS — E1122 Type 2 diabetes mellitus with diabetic chronic kidney disease: Secondary | ICD-10-CM | POA: Diagnosis not present

## 2022-08-19 DIAGNOSIS — L409 Psoriasis, unspecified: Secondary | ICD-10-CM | POA: Diagnosis not present

## 2022-08-26 ENCOUNTER — Encounter (HOSPITAL_COMMUNITY): Payer: Self-pay

## 2022-08-26 ENCOUNTER — Emergency Department (HOSPITAL_COMMUNITY): Payer: Medicare Other

## 2022-08-26 ENCOUNTER — Other Ambulatory Visit: Payer: Self-pay

## 2022-08-26 ENCOUNTER — Emergency Department (HOSPITAL_COMMUNITY)
Admission: EM | Admit: 2022-08-26 | Discharge: 2022-08-26 | Disposition: A | Discharge status: Home / Self Care | Payer: Medicare Other | Attending: Emergency Medicine | Admitting: Emergency Medicine

## 2022-08-26 DIAGNOSIS — Z7982 Long term (current) use of aspirin: Secondary | ICD-10-CM | POA: Diagnosis not present

## 2022-08-26 DIAGNOSIS — Z743 Need for continuous supervision: Secondary | ICD-10-CM | POA: Diagnosis not present

## 2022-08-26 DIAGNOSIS — E860 Dehydration: Secondary | ICD-10-CM | POA: Insufficient documentation

## 2022-08-26 DIAGNOSIS — R4182 Altered mental status, unspecified: Secondary | ICD-10-CM | POA: Diagnosis not present

## 2022-08-26 DIAGNOSIS — R6889 Other general symptoms and signs: Secondary | ICD-10-CM | POA: Diagnosis not present

## 2022-08-26 DIAGNOSIS — R29810 Facial weakness: Secondary | ICD-10-CM | POA: Diagnosis not present

## 2022-08-26 DIAGNOSIS — R739 Hyperglycemia, unspecified: Secondary | ICD-10-CM | POA: Diagnosis not present

## 2022-08-26 DIAGNOSIS — N390 Urinary tract infection, site not specified: Secondary | ICD-10-CM | POA: Diagnosis not present

## 2022-08-26 DIAGNOSIS — R531 Weakness: Secondary | ICD-10-CM | POA: Insufficient documentation

## 2022-08-26 DIAGNOSIS — R410 Disorientation, unspecified: Secondary | ICD-10-CM | POA: Diagnosis not present

## 2022-08-26 DIAGNOSIS — J9811 Atelectasis: Secondary | ICD-10-CM | POA: Diagnosis not present

## 2022-08-26 LAB — CBC WITH DIFFERENTIAL/PLATELET
Abs Immature Granulocytes: 0.09 10*3/uL — ABNORMAL HIGH (ref 0.00–0.07)
Basophils Absolute: 0 10*3/uL (ref 0.0–0.1)
Basophils Relative: 0 %
Eosinophils Absolute: 0 10*3/uL (ref 0.0–0.5)
Eosinophils Relative: 0 %
HCT: 38.4 % (ref 36.0–46.0)
Hemoglobin: 12 g/dL (ref 12.0–15.0)
Immature Granulocytes: 1 %
Lymphocytes Relative: 6 %
Lymphs Abs: 0.9 10*3/uL (ref 0.7–4.0)
MCH: 28.6 pg (ref 26.0–34.0)
MCHC: 31.3 g/dL (ref 30.0–36.0)
MCV: 91.4 fL (ref 80.0–100.0)
Monocytes Absolute: 1.2 10*3/uL — ABNORMAL HIGH (ref 0.1–1.0)
Monocytes Relative: 7 %
Neutro Abs: 14.2 10*3/uL — ABNORMAL HIGH (ref 1.7–7.7)
Neutrophils Relative %: 86 %
Platelets: 238 10*3/uL (ref 150–400)
RBC: 4.2 MIL/uL (ref 3.87–5.11)
RDW: 15.1 % (ref 11.5–15.5)
WBC: 16.4 10*3/uL — ABNORMAL HIGH (ref 4.0–10.5)
nRBC: 0 % (ref 0.0–0.2)

## 2022-08-26 LAB — CBG MONITORING, ED: Glucose-Capillary: 220 mg/dL — ABNORMAL HIGH (ref 70–99)

## 2022-08-26 LAB — I-STAT CHEM 8, ED
BUN: 41 mg/dL — ABNORMAL HIGH (ref 8–23)
Calcium, Ion: 1.17 mmol/L (ref 1.15–1.40)
Chloride: 105 mmol/L (ref 98–111)
Creatinine, Ser: 2.3 mg/dL — ABNORMAL HIGH (ref 0.44–1.00)
Glucose, Bld: 224 mg/dL — ABNORMAL HIGH (ref 70–99)
HCT: 39 % (ref 36.0–46.0)
Hemoglobin: 13.3 g/dL (ref 12.0–15.0)
Potassium: 4.2 mmol/L (ref 3.5–5.1)
Sodium: 139 mmol/L (ref 135–145)
TCO2: 23 mmol/L (ref 22–32)

## 2022-08-26 LAB — ETHANOL: Alcohol, Ethyl (B): <10 mg/dL (ref <10)

## 2022-08-26 LAB — COMPREHENSIVE METABOLIC PANEL
ALT: 17 U/L (ref 0–44)
AST: 25 U/L (ref 15–41)
Albumin: 2.9 g/dL — ABNORMAL LOW (ref 3.5–5.0)
Alkaline Phosphatase: 43 U/L (ref 38–126)
Anion gap: 14 (ref 5–15)
BUN: 34 mg/dL — ABNORMAL HIGH (ref 8–23)
CO2: 18 mmol/L — ABNORMAL LOW (ref 22–32)
Calcium: 7.3 mg/dL — ABNORMAL LOW (ref 8.9–10.3)
Chloride: 109 mmol/L (ref 98–111)
Creatinine, Ser: 1.92 mg/dL — ABNORMAL HIGH (ref 0.44–1.00)
GFR, Estimated: 28 mL/min — ABNORMAL LOW (ref >60)
Glucose, Bld: 190 mg/dL — ABNORMAL HIGH (ref 70–99)
Potassium: 3.3 mmol/L — ABNORMAL LOW (ref 3.5–5.1)
Sodium: 141 mmol/L (ref 135–145)
Total Bilirubin: 0.7 mg/dL (ref 0.3–1.2)
Total Protein: 5.6 g/dL — ABNORMAL LOW (ref 6.5–8.1)

## 2022-08-26 LAB — URINALYSIS, W/ REFLEX TO CULTURE (INFECTION SUSPECTED)
Bilirubin Urine: NEGATIVE
Glucose, UA: NEGATIVE mg/dL
Ketones, ur: NEGATIVE mg/dL
Nitrite: NEGATIVE
Protein, ur: NEGATIVE mg/dL
Specific Gravity, Urine: 1.015 (ref 1.005–1.030)
WBC, UA: >50 WBC/hpf (ref 0–5)
pH: 5 (ref 5.0–8.0)

## 2022-08-26 LAB — TROPONIN I (HIGH SENSITIVITY)
Troponin I (High Sensitivity): 14 ng/L (ref <18)
Troponin I (High Sensitivity): 21 ng/L — ABNORMAL HIGH (ref <18)
Troponin I (High Sensitivity): 24 ng/L — ABNORMAL HIGH (ref <18)

## 2022-08-26 MED ORDER — CEPHALEXIN 500 MG PO CAPS: 500.0000 mg | ORAL_CAPSULE | Freq: Two times a day (BID) | ORAL | 0 refills | Status: DC

## 2022-08-26 MED ORDER — SODIUM CHLORIDE 0.9 % IV BOLUS
1000.0000 mL | Freq: Once | INTRAVENOUS | Status: AC
  Administered 2022-08-26: 1000 mL via INTRAVENOUS

## 2022-08-26 MED ORDER — SODIUM CHLORIDE 0.9 % IV SOLN
1.0000 g | Freq: Once | INTRAVENOUS | Status: AC
  Administered 2022-08-26: 1 g via INTRAVENOUS
  Filled 2022-08-26: qty 10

## 2022-08-26 NOTE — ED Triage Notes (Addendum)
Pt arrives via EMS from home. Family reports that patient has had an increase in her confusion since yesterday. Family reports they noticed some facial droop around 1000 when they were helping the patient get changed. No facial droop noted in triage. Pt follows commands and was able to say her name. This is her reported baseline. PT arrives with a most form.

## 2022-08-26 NOTE — ED Provider Notes (Addendum)
Patient is a 72 year old who presents with weakness and some confusion.  Care was taken over from Dr. Rodena Medin.  Patient got some IV fluids and now appears back to baseline mental status per family who is at bedside.  She does have evidence of urinary tract infection.   Was given dose of IV Rocephin.  Urine was sent for culture.  Will start Keflex.  Patient does have an AKI.  Her troponins are minimally elevated but flat.  Discussed with the family at bedside about possible admission but they would be more comfortable with patient going home.  They have a MOST form and do not want overly aggressive care.  Patient was discharged back to home in good condition.  Return precautions given.   Rolan Bucco, MD 08/26/22 2536    Rolan Bucco, MD 08/26/22 2112

## 2022-08-26 NOTE — ED Notes (Signed)
Put pt on bedpan to attempt to get urine sample, will try again later.

## 2022-08-26 NOTE — ED Provider Notes (Signed)
[place] EMERGENCY DEPARTMENT AT [facility] Provider Note   CSN: [id number] Arrival date & time: 08/26/22  1312     History  Chief Complaint  Patient presents with   Altered Mental Status    Mariama Saintvil is a 72 y.o. female.  72 year old female with prior medical history as detailed below presents for evaluation.  Patient arrives from home by EMS.  Majority of history obtained from patient's son.  Patient's son is at bedside.  He reports that this morning his mother was slow to get up.  She seemed to be much more lethargic than her typical.  After getting her out of bed she had very low energy and seemed to be having difficulty ambulating.  At this point the son decided to call EMS.  On the son's arrival to the ED he feels that his mother looks improved.  She is at her baseline mental status.  She is minimally verbal.  Patient does have a MOST form that delineates DNR DNI.  Additionally IV fluids are not requested.  Antibiotics orally are okay.  Patient's son is power of attorney.  Again, he is at bedside.  The history is provided by the patient and medical records.       Home Medications Prior to Admission medications   Medication Sig Start Date End Date Taking? Authorizing Provider  aspirin 81 MG chewable tablet 1 tablet    [provider]  memantine (NAMENDA) 10 MG tablet TAKE 1 TABLET BY MOUTH TWICE A DAY 06/10/22   Gwynneth Munson, Sung Amabile, PA-C  metoprolol succinate (TOPROL-XL) 25 MG 24 hr tablet Take 50 mg by mouth daily.  02/14/19   [provider]  rosuvastatin (CRESTOR) 20 MG tablet 20 mg. 12/22/18   [provider]  valsartan-hydrochlorothiazide (DIOVAN-HCT) 80-12.5 MG tablet Take 1 tablet by mouth daily. 1/2 tab po qd 01/10/21   [provider]      Allergies    Fish oil, Sulfa antibiotics, Sulfa drugs cross reactors, and Zestril [lisinopril]    Review of Systems   Review of Systems  Unable to perform ROS: Other     Physical Exam Updated Vital Signs BP 112/66   Pulse 89   Temp 97.8 F (36.6 C)   Resp 16   Ht 5\' 6"  (1.676 m)   Wt 81.6 kg   SpO2 95%   BMI 29.05 kg/m  Physical Exam Vitals and nursing note reviewed.  Constitutional:      General: She is not in acute distress.    Appearance: Normal appearance. She is well-developed.  HENT:     Head: Normocephalic and atraumatic.  Eyes:     Conjunctiva/sclera: Conjunctivae normal.     Pupils: Pupils are equal, round, and reactive to light.  Cardiovascular:     Rate and Rhythm: Normal rate and regular rhythm.     Heart sounds: Normal heart sounds.  Pulmonary:     Effort: Pulmonary effort is normal. No respiratory distress.     Breath sounds: Normal breath sounds.  Abdominal:     General: There is no distension.     Palpations: Abdomen is soft.     Tenderness: There is no abdominal tenderness.  Musculoskeletal:        General: No deformity. Normal range of motion.     Cervical back: Normal range of motion and neck supple.  Skin:    General: Skin is warm and dry.  Neurological:     General: No focal deficit present.  Mental Status: She is alert.     Comments: Alert, oriented to person, minimally verbal at baseline, no facial droop, no focal weakness     ED Results / Procedures / Treatments   Labs (all labs ordered are listed, but only abnormal results are displayed) Labs Reviewed  CBC WITH DIFFERENTIAL/PLATELET - Abnormal; Notable for the following components:      Result Value   WBC 16.4 (*)    Neutro Abs 14.2 (*)    Monocytes Absolute 1.2 (*)    Abs Immature Granulocytes 0.09 (*)    All other components within normal limits  I-STAT CHEM 8, ED - Abnormal; Notable for the following components:   BUN 41 (*)    Creatinine, Ser 2.30 (*)    Glucose, Bld 224 (*)    All other components within normal limits  CBG MONITORING, ED - Abnormal; Notable for the following components:   Glucose-Capillary 220 (*)    All other  components within normal limits  ETHANOL  COMPREHENSIVE METABOLIC PANEL  URINALYSIS, W/ REFLEX TO CULTURE (INFECTION SUSPECTED)  TROPONIN I (HIGH SENSITIVITY)    EKG EKG Interpretation  Date/Time:  Wednesday August 26 2022 13:19:59 EDT Ventricular Rate:  88 PR Interval:  190 QRS Duration: 98 QT Interval:  364 QTC Calculation: 441 R Axis:   13 Text Interpretation: Sinus rhythm Confirmed by Kristine Royal [phone]) on 08/26/2022 1:39:30 PM  Radiology DG Chest Port 1 View  Result Date: 08/26/2022 CLINICAL DATA:  Weakness EXAM: PORTABLE CHEST 1 VIEW COMPARISON:  X-ray 02/07/2020 FINDINGS: Underinflation. Mild basilar atelectasis. No consolidation, pneumothorax, effusion or edema. Normal cardiopericardial silhouette. Overlapping cardiac leads. Distended stomach with air seen beneath the left hemidiaphragm. Film is slightly rotated to the left. IMPRESSION: Rotated underinflated x-ray with some basilar atelectasis. Electronically Signed   By: Karen Kays M.D.   On: 08/26/2022 14:11    Procedures Procedures    Medications Ordered in ED Medications - No data to display  ED Course/ Medical Decision Making/ A&P                             Medical Decision Making Amount and/or Complexity of Data Reviewed Labs: ordered. Radiology: ordered.  Risk Prescription drug management.    Medical Screen Complete  This patient presented to the ED with complaint of AMS, weakness.  This complaint involves an extensive number of treatment options. The initial differential diagnosis includes, but is not limited to, metabolic abnormality, dehydration, infection, etc.  This presentation is: Acute, Chronic, Self-Limited, Previously Undiagnosed, Uncertain Prognosis, Complicated, Systemic Symptoms, and Threat to Life/Bodily Function  Patient is accompanied by son and presents for evaluation of transient weakness.  Patient does appear to be improved upon evaluation here in the ED.  Patient with  MOST form that delineates DNR DNI, no IV fluids, oral antibiotics only.  Comfort measures are prioritized.  Screening labs to be obtained.  Discussed workup extensively with the son.  Additional, imaging including CT discussed with son.  Given MOST form additional imaging would be an appropriate since additional actions would not be indicated.  Obtained labs are suggestive of at least some degree of AKI and dehydration.  Treatment of same discussed extensively with son.  Patient's son is requesting IV fluids now.  Patient's son is requesting that we complete ED evaluation including obtaining urine.  Patient's son feels that his mother would be more comfortable at home if admission can be avoided.  Son  requested IV antibiotics here in the ED.  Once urine is obtained discussion about disposition can be readdressed.  Currently, patient's son prefers that the patient be sent home.  Oncoming EDP, Dr. Fredderick Phenix, aware of case.    Additional history obtained:  Additional history obtained from EMS and Family External records from outside sources obtained and reviewed including prior ED visits and prior Inpatient records.    Lab Tests:  I ordered and personally interpreted labs.    Imaging Studies ordered:  I ordered imaging studies including chest x-ray I independently visualized and interpreted obtained imaging which showed NAD I agree with the radiologist interpretation.   Cardiac Monitoring:  The patient was maintained on a cardiac monitor.  I personally viewed and interpreted the cardiac monitor which showed an underlying rhythm of: NSR   Medicines ordered:  I ordered medication including IV fluids for suspected dehydration Reevaluation of the patient after these medicines showed that the patient: improved  Problem List / ED Course:  Weakness, dehydration   Reevaluation:  After the interventions noted above, I reevaluated the patient and found that they have:  improved  Disposition:  After consideration of the diagnostic results and the patients response to treatment, I feel that the patent would benefit from completion of ED evaluation.          Final Clinical Impression(s) / ED Diagnoses Final diagnoses:  Weakness    Rx / DC Orders ED Discharge Orders     None         Wynetta Fines, MD 09/04/22 [phone]

## 2022-08-26 NOTE — Discharge Instructions (Addendum)
Take the antibiotics as discussed.  Return to the emergency room if there is any worsening symptoms.  Make sure that you make a close follow-up appointment with your primary care doctor to have your kidney function rechecked.

## 2022-08-27 DIAGNOSIS — R531 Weakness: Secondary | ICD-10-CM | POA: Diagnosis not present

## 2022-08-27 LAB — URINE CULTURE: Culture: >=100000 — AB

## 2022-08-28 LAB — URINE CULTURE

## 2022-08-29 ENCOUNTER — Telehealth (HOSPITAL_BASED_OUTPATIENT_CLINIC_OR_DEPARTMENT_OTHER): Payer: Self-pay

## 2022-08-29 NOTE — Telephone Encounter (Signed)
Post ED Visit - Positive Culture Follow-up  Culture report reviewed by antimicrobial stewardship pharmacist: [facility] [x]  Estill Batten, Pharm.D. BCCCP []  Celedonio Miyamoto, Pharm.D., BCPS AQ-ID []  Garvin Fila, Pharm.D., BCPS []  Georgina Pillion, Pharm.D., BCPS []  [place], [place].D., BCPS, AAHIVP []  Estella Husk, Pharm.D., BCPS, AAHIVP []  Lysle Pearl, PharmD, BCPS []  Phillips Climes, PharmD, BCPS []  Agapito Games, PharmD, BCPS []  Verlan Friends, PharmD []  Mervyn Gay, PharmD, BCPS []  Vinnie Level, PharmD  [facility] []  Len Childs, PharmD []  Greer Pickerel, PharmD []  Adalberto Cole, PharmD []  Perlie Gold, Rph []  Lonell Face) Jean Rosenthal, PharmD []  Earl Many, PharmD []  Junita Push, PharmD []  Dorna Leitz, PharmD []  Terrilee Files, PharmD []  Lynann Beaver, PharmD []  Keturah Barre, PharmD []  Loralee Pacas, PharmD []  Bernadene Person, PharmD   Positive urine culture Treated with Cephalexin, organism sensitive to the same and no further patient follow-up is required at this time.  Sandria Senter 08/29/2022, 1:24 PM

## 2022-09-03 ENCOUNTER — Encounter (HOSPITAL_COMMUNITY): Payer: Self-pay

## 2022-09-03 ENCOUNTER — Emergency Department (HOSPITAL_COMMUNITY): Payer: Medicare Other

## 2022-09-03 ENCOUNTER — Other Ambulatory Visit: Payer: Self-pay

## 2022-09-03 ENCOUNTER — Inpatient Hospital Stay (HOSPITAL_COMMUNITY)
Admission: EM | Admit: 2022-09-03 | Discharge: 2022-09-09 | DRG: 177 | Disposition: A | Discharge status: Hospice | Payer: Medicare Other | Attending: Internal Medicine | Admitting: Internal Medicine

## 2022-09-03 DIAGNOSIS — R0902 Hypoxemia: Secondary | ICD-10-CM | POA: Diagnosis not present

## 2022-09-03 DIAGNOSIS — G4710 Hypersomnia, unspecified: Secondary | ICD-10-CM | POA: Diagnosis not present

## 2022-09-03 DIAGNOSIS — E8809 Other disorders of plasma-protein metabolism, not elsewhere classified: Secondary | ICD-10-CM | POA: Diagnosis present

## 2022-09-03 DIAGNOSIS — E1129 Type 2 diabetes mellitus with other diabetic kidney complication: Secondary | ICD-10-CM | POA: Diagnosis not present

## 2022-09-03 DIAGNOSIS — F0390 Unspecified dementia without behavioral disturbance: Secondary | ICD-10-CM | POA: Diagnosis present

## 2022-09-03 DIAGNOSIS — J9601 Acute respiratory failure with hypoxia: Secondary | ICD-10-CM | POA: Diagnosis not present

## 2022-09-03 DIAGNOSIS — A419 Sepsis, unspecified organism: Secondary | ICD-10-CM | POA: Diagnosis not present

## 2022-09-03 DIAGNOSIS — R7989 Other specified abnormal findings of blood chemistry: Secondary | ICD-10-CM | POA: Diagnosis not present

## 2022-09-03 DIAGNOSIS — G9341 Metabolic encephalopathy: Secondary | ICD-10-CM | POA: Diagnosis present

## 2022-09-03 DIAGNOSIS — Z8673 Personal history of transient ischemic attack (TIA), and cerebral infarction without residual deficits: Secondary | ICD-10-CM

## 2022-09-03 DIAGNOSIS — F0150 Vascular dementia without behavioral disturbance: Secondary | ICD-10-CM | POA: Diagnosis present

## 2022-09-03 DIAGNOSIS — Z79899 Other long term (current) drug therapy: Secondary | ICD-10-CM

## 2022-09-03 DIAGNOSIS — J690 Pneumonitis due to inhalation of food and vomit: Principal | ICD-10-CM | POA: Diagnosis present

## 2022-09-03 DIAGNOSIS — Z882 Allergy status to sulfonamides status: Secondary | ICD-10-CM

## 2022-09-03 DIAGNOSIS — G9389 Other specified disorders of brain: Secondary | ICD-10-CM | POA: Diagnosis not present

## 2022-09-03 DIAGNOSIS — Z888 Allergy status to other drugs, medicaments and biological substances status: Secondary | ICD-10-CM

## 2022-09-03 DIAGNOSIS — R531 Weakness: Secondary | ICD-10-CM | POA: Diagnosis not present

## 2022-09-03 DIAGNOSIS — R9431 Abnormal electrocardiogram [ECG] [EKG]: Secondary | ICD-10-CM | POA: Diagnosis not present

## 2022-09-03 DIAGNOSIS — L409 Psoriasis, unspecified: Secondary | ICD-10-CM | POA: Diagnosis present

## 2022-09-03 DIAGNOSIS — R918 Other nonspecific abnormal finding of lung field: Secondary | ICD-10-CM | POA: Diagnosis present

## 2022-09-03 DIAGNOSIS — K828 Other specified diseases of gallbladder: Secondary | ICD-10-CM | POA: Diagnosis not present

## 2022-09-03 DIAGNOSIS — Z66 Do not resuscitate: Secondary | ICD-10-CM | POA: Diagnosis not present

## 2022-09-03 DIAGNOSIS — E119 Type 2 diabetes mellitus without complications: Secondary | ICD-10-CM | POA: Diagnosis not present

## 2022-09-03 DIAGNOSIS — Z743 Need for continuous supervision: Secondary | ICD-10-CM | POA: Diagnosis not present

## 2022-09-03 DIAGNOSIS — R404 Transient alteration of awareness: Secondary | ICD-10-CM | POA: Diagnosis not present

## 2022-09-03 DIAGNOSIS — E669 Obesity, unspecified: Secondary | ICD-10-CM | POA: Diagnosis present

## 2022-09-03 DIAGNOSIS — Z7401 Bed confinement status: Secondary | ICD-10-CM

## 2022-09-03 DIAGNOSIS — E785 Hyperlipidemia, unspecified: Secondary | ICD-10-CM | POA: Diagnosis present

## 2022-09-03 DIAGNOSIS — Z1152 Encounter for screening for COVID-19: Secondary | ICD-10-CM | POA: Diagnosis not present

## 2022-09-03 DIAGNOSIS — K5900 Constipation, unspecified: Secondary | ICD-10-CM | POA: Diagnosis not present

## 2022-09-03 DIAGNOSIS — I700 Atherosclerosis of aorta: Secondary | ICD-10-CM | POA: Diagnosis not present

## 2022-09-03 DIAGNOSIS — Z7189 Other specified counseling: Secondary | ICD-10-CM | POA: Diagnosis not present

## 2022-09-03 DIAGNOSIS — Z6832 Body mass index (BMI) 32.0-32.9, adult: Secondary | ICD-10-CM | POA: Diagnosis not present

## 2022-09-03 DIAGNOSIS — R809 Proteinuria, unspecified: Secondary | ICD-10-CM | POA: Diagnosis not present

## 2022-09-03 DIAGNOSIS — G9340 Encephalopathy, unspecified: Secondary | ICD-10-CM | POA: Diagnosis not present

## 2022-09-03 DIAGNOSIS — I10 Essential (primary) hypertension: Secondary | ICD-10-CM | POA: Diagnosis present

## 2022-09-03 DIAGNOSIS — Z789 Other specified health status: Secondary | ICD-10-CM | POA: Diagnosis not present

## 2022-09-03 DIAGNOSIS — I2510 Atherosclerotic heart disease of native coronary artery without angina pectoris: Secondary | ICD-10-CM | POA: Diagnosis present

## 2022-09-03 DIAGNOSIS — I739 Peripheral vascular disease, unspecified: Secondary | ICD-10-CM | POA: Diagnosis not present

## 2022-09-03 DIAGNOSIS — Z7982 Long term (current) use of aspirin: Secondary | ICD-10-CM

## 2022-09-03 DIAGNOSIS — Z823 Family history of stroke: Secondary | ICD-10-CM

## 2022-09-03 DIAGNOSIS — Z87440 Personal history of urinary (tract) infections: Secondary | ICD-10-CM

## 2022-09-03 DIAGNOSIS — I21A1 Myocardial infarction type 2: Secondary | ICD-10-CM | POA: Diagnosis not present

## 2022-09-03 DIAGNOSIS — Z87442 Personal history of urinary calculi: Secondary | ICD-10-CM

## 2022-09-03 DIAGNOSIS — R5381 Other malaise: Secondary | ICD-10-CM | POA: Diagnosis not present

## 2022-09-03 DIAGNOSIS — L8995 Pressure ulcer of unspecified site, unstageable: Secondary | ICD-10-CM | POA: Diagnosis not present

## 2022-09-03 DIAGNOSIS — L89511 Pressure ulcer of right ankle, stage 1: Secondary | ICD-10-CM | POA: Diagnosis not present

## 2022-09-03 DIAGNOSIS — Z515 Encounter for palliative care: Secondary | ICD-10-CM | POA: Diagnosis not present

## 2022-09-03 DIAGNOSIS — J9811 Atelectasis: Secondary | ICD-10-CM | POA: Diagnosis not present

## 2022-09-03 DIAGNOSIS — Z803 Family history of malignant neoplasm of breast: Secondary | ICD-10-CM

## 2022-09-03 LAB — URINALYSIS, W/ REFLEX TO CULTURE (INFECTION SUSPECTED)
Bacteria, UA: NONE SEEN
Bilirubin Urine: NEGATIVE
Glucose, UA: NEGATIVE mg/dL
Hgb urine dipstick: NEGATIVE
Ketones, ur: NEGATIVE mg/dL
Leukocytes,Ua: NEGATIVE
Nitrite: NEGATIVE
Protein, ur: NEGATIVE mg/dL
Specific Gravity, Urine: 1.021 (ref 1.005–1.030)
pH: 5 (ref 5.0–8.0)

## 2022-09-03 LAB — COMPREHENSIVE METABOLIC PANEL
ALT: 27 U/L (ref 0–44)
AST: 26 U/L (ref 15–41)
Albumin: 3 g/dL — ABNORMAL LOW (ref 3.5–5.0)
Alkaline Phosphatase: 57 U/L (ref 38–126)
Anion gap: 12 (ref 5–15)
BUN: 17 mg/dL (ref 8–23)
CO2: 27 mmol/L (ref 22–32)
Calcium: 8.8 mg/dL — ABNORMAL LOW (ref 8.9–10.3)
Chloride: 99 mmol/L (ref 98–111)
Creatinine, Ser: 0.95 mg/dL (ref 0.44–1.00)
GFR, Estimated: >60 mL/min (ref >60)
Glucose, Bld: 154 mg/dL — ABNORMAL HIGH (ref 70–99)
Potassium: 3.9 mmol/L (ref 3.5–5.1)
Sodium: 138 mmol/L (ref 135–145)
Total Bilirubin: 0.8 mg/dL (ref 0.3–1.2)
Total Protein: 6.5 g/dL (ref 6.5–8.1)

## 2022-09-03 LAB — CBC WITH DIFFERENTIAL/PLATELET
Abs Immature Granulocytes: 0.05 10*3/uL (ref 0.00–0.07)
Basophils Absolute: 0.1 10*3/uL (ref 0.0–0.1)
Basophils Relative: 1 %
Eosinophils Absolute: 0.2 10*3/uL (ref 0.0–0.5)
Eosinophils Relative: 2 %
HCT: 35.3 % — ABNORMAL LOW (ref 36.0–46.0)
Hemoglobin: 11 g/dL — ABNORMAL LOW (ref 12.0–15.0)
Immature Granulocytes: 1 %
Lymphocytes Relative: 11 %
Lymphs Abs: 1.2 10*3/uL (ref 0.7–4.0)
MCH: 28.6 pg (ref 26.0–34.0)
MCHC: 31.2 g/dL (ref 30.0–36.0)
MCV: 91.9 fL (ref 80.0–100.0)
Monocytes Absolute: 1 10*3/uL (ref 0.1–1.0)
Monocytes Relative: 9 %
Neutro Abs: 8 10*3/uL — ABNORMAL HIGH (ref 1.7–7.7)
Neutrophils Relative %: 76 %
Platelets: 287 10*3/uL (ref 150–400)
RBC: 3.84 MIL/uL — ABNORMAL LOW (ref 3.87–5.11)
RDW: 15.6 % — ABNORMAL HIGH (ref 11.5–15.5)
WBC: 10.4 10*3/uL (ref 4.0–10.5)
nRBC: 0 % (ref 0.0–0.2)

## 2022-09-03 LAB — AMMONIA: Ammonia: <10 umol/L (ref 9–35)

## 2022-09-03 LAB — MAGNESIUM: Magnesium: 1.9 mg/dL (ref 1.7–2.4)

## 2022-09-03 LAB — LACTIC ACID, PLASMA: Lactic Acid, Venous: 1.2 mmol/L (ref 0.5–1.9)

## 2022-09-03 LAB — OSMOLALITY, URINE: Osmolality, Ur: 415 mOsm/kg (ref 300–900)

## 2022-09-03 LAB — SARS CORONAVIRUS 2 BY RT PCR: SARS Coronavirus 2 by RT PCR: NEGATIVE

## 2022-09-03 MED ORDER — IOHEXOL 350 MG/ML SOLN
75.0000 mL | Freq: Once | INTRAVENOUS | Status: AC | PRN
  Administered 2022-09-03: 75 mL via INTRAVENOUS

## 2022-09-03 MED ORDER — MILK AND MOLASSES ENEMA
1.0000 | Freq: Once | RECTAL | Status: DC
  Filled 2022-09-03: qty 240

## 2022-09-03 MED ORDER — SODIUM CHLORIDE 0.9 % IV SOLN: 1.0000 g | INTRAVENOUS | Status: DC

## 2022-09-03 MED ORDER — POLYETHYLENE GLYCOL 3350 17 G PO PACK
17.0000 g | PACK | Freq: Every day | ORAL | Status: DC | PRN
  Administered 2022-09-05: 17 g via ORAL
  Filled 2022-09-03 (×2): qty 1

## 2022-09-03 MED ORDER — SODIUM CHLORIDE 0.9 % IV SOLN
500.0000 mg | Freq: Once | INTRAVENOUS | Status: AC
  Administered 2022-09-03: 500 mg via INTRAVENOUS
  Filled 2022-09-03: qty 5

## 2022-09-03 MED ORDER — BISACODYL 10 MG RE SUPP: 10.0000 mg | Freq: Once | RECTAL | Status: DC

## 2022-09-03 MED ORDER — SODIUM CHLORIDE 0.9 % IV SOLN
3.0000 g | Freq: Four times a day (QID) | INTRAVENOUS | Status: DC
  Administered 2022-09-04 – 2022-09-08 (×16): 3 g via INTRAVENOUS
  Filled 2022-09-03 (×16): qty 8

## 2022-09-03 MED ORDER — SODIUM CHLORIDE 0.9 % IV SOLN
1.0000 g | Freq: Once | INTRAVENOUS | Status: AC
  Administered 2022-09-03: 1 g via INTRAVENOUS
  Filled 2022-09-03: qty 10

## 2022-09-03 MED ORDER — SODIUM CHLORIDE 0.9 % IV BOLUS
1000.0000 mL | Freq: Once | INTRAVENOUS | Status: AC
  Administered 2022-09-03: 1000 mL via INTRAVENOUS

## 2022-09-03 MED ORDER — SODIUM CHLORIDE 0.9 % IV SOLN
500.0000 mg | INTRAVENOUS | Status: AC
  Administered 2022-09-04 – 2022-09-07 (×4): 500 mg via INTRAVENOUS
  Filled 2022-09-03 (×4): qty 5

## 2022-09-03 MED ORDER — INSULIN ASPART 100 UNIT/ML IJ SOLN
0.0000 [IU] | INTRAMUSCULAR | Status: DC
  Administered 2022-09-04 (×2): 1 [IU] via SUBCUTANEOUS
  Administered 2022-09-04 – 2022-09-05 (×3): 2 [IU] via SUBCUTANEOUS
  Administered 2022-09-05: 3 [IU] via SUBCUTANEOUS
  Administered 2022-09-06: 2 [IU] via SUBCUTANEOUS
  Administered 2022-09-06: 5 [IU] via SUBCUTANEOUS
  Administered 2022-09-06: 2 [IU] via SUBCUTANEOUS
  Administered 2022-09-06 – 2022-09-07 (×3): 1 [IU] via SUBCUTANEOUS
  Administered 2022-09-07: 2 [IU] via SUBCUTANEOUS
  Administered 2022-09-07: 1 [IU] via SUBCUTANEOUS
  Administered 2022-09-07: 5 [IU] via SUBCUTANEOUS
  Administered 2022-09-07: 1 [IU] via SUBCUTANEOUS
  Administered 2022-09-07: 2 [IU] via SUBCUTANEOUS
  Administered 2022-09-08: 3 [IU] via SUBCUTANEOUS
  Administered 2022-09-08: 2 [IU] via SUBCUTANEOUS
  Administered 2022-09-08 – 2022-09-09 (×3): 1 [IU] via SUBCUTANEOUS

## 2022-09-03 NOTE — Assessment & Plan Note (Addendum)
Cont aspirin and crestor CT head non acute despite symptoms ongoing for the past 1 wk Discussed with family  Do not feel need to pursue MRI at this time

## 2022-09-03 NOTE — Assessment & Plan Note (Signed)
Progressive end stage with debility  Will order SLP  PT OT Palliative care consult

## 2022-09-03 NOTE — Assessment & Plan Note (Signed)
Order bowel regimen °

## 2022-09-03 NOTE — Progress Notes (Signed)
Pharmacy Antibiotic Note  Caroline White is a 72 y.o. female admitted on 09/03/2022 with  aspiration pneumonia .  Pharmacy has been consulted for Unasyn  dosing.  Plan: Unasyn 3g q6h.  Follow culture data for de-escalation.  Monitor renal function for dose adjustments as indicated.   Height: 5\' 6"  (167.6 cm) Weight: 81.6 kg (179 lb 14.3 oz) IBW/kg (Calculated) : 59.3  Temp (24hrs), Avg:98.2 F (36.8 C), Min:98.1 F (36.7 C), Max:98.2 F (36.8 C)  Recent Labs  Lab 09/03/22 1645 09/03/22 1720  WBC  --  10.4  CREATININE  --  0.95  LATICACIDVEN 1.2  --     Estimated Creatinine Clearance: 58.5 mL/min (by C-G formula based on SCr of 0.95 mg/dL).    Allergies  Allergen Reactions   Fish Oil     Heart racing   Sulfa Antibiotics Other (See Comments)   Sulfa Drugs Cross Reactors Other (See Comments)    Unknown    Zestril [Lisinopril] Cough    Thank you for allowing pharmacy to be a part of this patient's care.  Estill Batten, PharmD, BCCCP  09/03/2022 11:03 PM

## 2022-09-03 NOTE — Assessment & Plan Note (Signed)
this patient has acute respiratory failure with Hypoxia as documented by the presence of following: O2 saturatio< 90% on RA   Likely due to:   Pneumonia,   Provide O2 therapy and titrate as needed  Continuous pulse ox   check Pulse ox with ambulation prior to discharge   may need  TC consult for home O2 set up    flutter valve ordered   

## 2022-09-03 NOTE — ED Notes (Signed)
Patient transported to CT 

## 2022-09-03 NOTE — H&P (Signed)
Caroline White WJX:[id number] DOB: [birth year] DOA: 09/03/2022     PCP: Blair Heys, MD   Outpatient Specialists:   [facility] Marcos Eke, PA    Patient arrived to ER on 09/03/22 at 1639 Referred by Attending Terrilee Files, MD   Patient coming from:    home Lives   With family other family lives close by    Chief Complaint:   Chief Complaint  Patient presents with   Weakness    HPI: Caroline White is a 71 y.o. female with medical history significant of  vascular Dementia, HTN, HLD, CVA, DM2   Presented with  debility Patient has been progressively weak for the past 1 week.  Now has new back bedsores on the buttocks usually was able to walk independently has been seen last week here for UTI.  On arrival blood pressure 128/72 heart rate 83 UTI urine culture grew E. coli which was pansensitive and she was treated with Keflex No associated headaches no chest pain or shortness of breath no abdominal pain    Has not been walking  Not able to transfer  Sleeing all the time  Last Thursday she was a bit off And face was a bit droopy  She was so confused could not get back to her own bed Home health service has been contacted but was declined Family reports has hx of purple toes and poor circulation  Denies significant ETOH intake   Does not smoke   Lab Results  Component Value Date   SARSCOV2NAA NEGATIVE 09/03/2022     Regarding pertinent Chronic problems:     Hyperlipidemia - on statins Crestor    HTN on toprol, diovan,    Hx of CVA -with residual deficits on Aspirin 81 mg,  Dementia - on Nemenda    While in ER: Clinical Course as of 09/03/22 2229  Thu Sep 03, 2022  1716 Chest x-ray interpreted by me as poor inspiration possible left lower lobe infiltrate. [MB]  1756 Patient's daughter is here now.  She said since she has been home she has not been able to independently transfer and she has been mostly dead weight.  She has some  bruises on her arms from family trying to assist her up.  They have been working on try to get her set up with home services but this is all in process and not fully arranged yet. [MB]  2103 CT head showing no acute findings.  Old right frontal infarct and chronic microvascular changes.  CT chest abdomen and pelvis showing bibasilar atelectasis pneumonia unable to be excluded.  Multiple pulmonary nodules.  No acute findings in abdomen or pelvis.  Urinalysis came back with no gross signs of infection.  Updated daughter.  Hospitalist has been paged for admission. [MB]  2221 Discussed with [facility] hospitalist Dr. Adela Glimpse who will evaluate for admission. [MB]    Clinical Course User Index [MB] Terrilee Files, MD     Lab Orders         Culture, blood (routine x 2)         SARS Coronavirus 2 by RT PCR (hospital order, performed in [facility] hospital lab) *cepheid single result test* Anterior Nasal Swab         CBC with Differential         Comprehensive metabolic panel         Ammonia         Magnesium  Urinalysis, w/ Reflex to Culture (Infection Suspected) -Urine, Catheterized         Lactic acid, plasma         CBG monitoring, ED      CT HEAD  NON acute  Old lACUNAR  CXR - Left lung base atelectasis or developing infiltrate.   CTabd/pelvis - stool ball  CT chest -  evidence of infiltrate  Following Medications were ordered in ER: Medications  sodium chloride 0.9 % bolus 1,000 mL (0 mLs Intravenous Stopped 09/03/22 2206)  cefTRIAXone (ROCEPHIN) 1 g in sodium chloride 0.9 % 100 mL IVPB (0 g Intravenous Stopped 09/03/22 2206)  azithromycin (ZITHROMAX) 500 mg in sodium chloride 0.9 % 250 mL IVPB (0 mg Intravenous Stopped 09/03/22 2206)  iohexol (OMNIPAQUE) 350 MG/ML injection 75 mL (75 mLs Intravenous Contrast Given 09/03/22 1916)     ED Triage Vitals  Enc Vitals Group     BP 09/03/22 1645 125/72     Pulse Rate 09/03/22 1645 81     Resp 09/03/22 1645 (!) 30     Temp 09/03/22  1645 98.2 F (36.8 C)     Temp Source 09/03/22 1645 Oral     SpO2 09/03/22 1645 92 %     Weight 09/03/22 1644 179 lb 14.3 oz (81.6 kg)     Height 09/03/22 1644 5\' 6"  (1.676 m)     Head Circumference --      Peak Flow --      Pain Score --      Pain Loc --      Pain Edu? --      Excl. in GC? --   TMAX(24)@     _________________________________________ Significant initial  Findings: Abnormal Labs Reviewed  CBC WITH DIFFERENTIAL/PLATELET - Abnormal; Notable for the following components:      Result Value   RBC 3.84 (*)    Hemoglobin 11.0 (*)    HCT 35.3 (*)    RDW 15.6 (*)    Neutro Abs 8.0 (*)    All other components within normal limits  COMPREHENSIVE METABOLIC PANEL - Abnormal; Notable for the following components:   Glucose, Bld 154 (*)    Calcium 8.8 (*)    Albumin 3.0 (*)    All other components within normal limits     ________________ Troponin ordered Cardiac Panel (last 3 results) No results for input(s): "CKTOTAL", "CKMB", "TROPONINIHS", "RELINDX" in the last 72 hours.   ECG: Ordered Personally reviewed and interpreted by me showing: HR : 90 Rhythm: Sinus rhythm Ventricular premature complex Low voltage, extremity leads Borderline prolonged QT interval No significant change since last tracing QTC 491   COVID-19 Labs  No results for input(s): "DDIMER", "FERRITIN", "LDH", "CRP" in the last 72 hours.  Lab Results  Component Value Date   SARSCOV2NAA NEGATIVE 09/03/2022     The recent clinical data is shown below. Vitals:   09/03/22 2205 09/03/22 2210 09/03/22 2211 09/03/22 2212  BP: (!) 148/91     Pulse:      Resp: (!) 26     Temp:    98.2 F (36.8 C)  TempSrc:    Axillary  SpO2:  93% 92%   Weight:      Height:        WBC     Component Value Date/Time   WBC 10.4 09/03/2022 1720   LYMPHSABS 1.2 09/03/2022 1720   MONOABS 1.0 09/03/2022 1720   EOSABS 0.2 09/03/2022 1720   BASOSABS 0.1 09/03/2022 1720  Lactic Acid, Venous    Component  Value Date/Time   LATICACIDVEN 1.2 09/03/2022 1645     Procalcitonin  Ordered      UA  no evidence of UTI   Urine analysis:    Component Value Date/Time   COLORURINE YELLOW 09/03/2022 2026   APPEARANCEUR CLEAR 09/03/2022 2026   LABSPEC 1.021 09/03/2022 2026   PHURINE 5.0 09/03/2022 2026   GLUCOSEU NEGATIVE 09/03/2022 2026   HGBUR NEGATIVE 09/03/2022 2026   BILIRUBINUR NEGATIVE 09/03/2022 2026   KETONESUR NEGATIVE 09/03/2022 2026   PROTEINUR NEGATIVE 09/03/2022 2026   NITRITE NEGATIVE 09/03/2022 2026   LEUKOCYTESUR NEGATIVE 09/03/2022 2026    Results for orders placed or performed during the hospital encounter of 09/03/22  SARS Coronavirus 2 by RT PCR (hospital order, performed in [facility] hospital lab) *cepheid single result test* Anterior Nasal Swab     Status: None   Collection Time: 09/03/22  5:50 PM   Specimen: Anterior Nasal Swab  Result Value Ref Range Status   SARS Coronavirus 2 by RT PCR NEGATIVE NEGATIVE Final    Comment: Performed at [facility], [address], [place], [place] [address]    ABX started Antibiotics Given (last 72 hours)     Date/Time Action Medication Dose Rate   09/03/22 1902 New Bag/Given   cefTRIAXone (ROCEPHIN) 1 g in sodium chloride 0.9 % 100 mL IVPB 1 g 200 mL/hr   09/03/22 1903 New Bag/Given   azithromycin (ZITHROMAX) 500 mg in sodium chloride 0.9 % 250 mL IVPB 500 mg 250 mL/hr       Susceptibility data from last 90 days. Collected Specimen Info Organism AMPICILLIN AMPICILLIN/SULBACTAM CEFAZOLIN CEFEPIME CEFTRIAXONE Ciprofloxacin Gentamicin Susc lslt Imipenem Nitrofurantoin Susc lslt Piperacillin + Tazobactam Trimethoprim/Sulfa  08/26/22 Urine, Clean Catch Escherichia coli  S  S  S  S  S  S  S  S  S  S  S    Ordered Venous  Blood Gas pending __________________________________________________________ Recent Labs  Lab 09/03/22 1720  NA 138  K 3.9  CO2 27  GLUCOSE 154*  BUN 17  CREATININE 0.95  CALCIUM 8.8*  MG 1.9     Cr   stable,  Lab Results  Component Value Date   CREATININE 0.95 09/03/2022   CREATININE 2.30 (H) 08/26/2022   CREATININE 1.92 (H) 08/26/2022    Recent Labs  Lab 09/03/22 1720  AST 26  ALT 27  ALKPHOS 57  BILITOT 0.8  PROT 6.5  ALBUMIN 3.0*   Lab Results  Component Value Date   CALCIUM 8.8 (L) 09/03/2022  Plt: Lab Results  Component Value Date   PLT 287 09/03/2022       Recent Labs  Lab 09/03/22 1720  WBC 10.4  NEUTROABS 8.0*  HGB 11.0*  HCT 35.3*  MCV 91.9  PLT 287    HG/HCT  stable    Component Value Date/Time   HGB 11.0 (L) 09/03/2022 1720   HCT 35.3 (L) 09/03/2022 1720   MCV 91.9 09/03/2022 1720   No results for input(s): "LIPASE", "AMYLASE" in the last 168 hours. Recent Labs  Lab 09/03/22 1645  AMMONIA <10  _______________________________________________ Hospitalist was called for admission for aspiration  pna   The following Work up has been ordered so far:  Orders Placed This Encounter  Procedures   Culture, blood (routine x 2)   SARS Coronavirus 2 by RT PCR (hospital order, performed in [facility] hospital lab) *cepheid single result test* Anterior Nasal Swab  DG Chest Port 1 View   CT Head Wo Contrast   CT CHEST ABDOMEN PELVIS W CONTRAST   CBC with Differential   Comprehensive metabolic panel   Ammonia   Magnesium   Urinalysis, w/ Reflex to Culture (Infection Suspected) -Urine, Catheterized   Lactic acid, plasma   ED Cardiac monitoring   ED Cardiac monitoring   In and Out Cath   Consult to hospitalist   Airborne and Contact precautions   CBG monitoring, ED   ED EKG   ED EKG   EKG 12-Lead   EKG 12-Lead   Insert peripheral IV     OTHER Significant initial  Findings:  labs showing:   DM  labs:  HbA1C: Recent Labs    11/07/21 1206  HGBA1C 5.2      CBG (last 3)  No results for input(s): "GLUCAP" in the last 72 hours.        Cultures:    Component Value Date/Time   SDES URINE, CLEAN CATCH 08/26/2022 1320    SPECREQUEST  08/26/2022 1320    NONE Reflexed from [id number] Performed at [facility], [address], [place], [place] [address]    CULT >=100,000 COLONIES/mL ESCHERICHIA COLI (A) 08/26/2022 1320   REPTSTATUS 08/28/2022 FINAL 08/26/2022 1320     Radiological Exams on Admission: DG Chest Port 1 View  Result Date: 09/03/2022 CLINICAL DATA:  Weakness. EXAM: PORTABLE CHEST 1 VIEW COMPARISON:  Chest radiograph dated 08/26/2022 head FINDINGS: There is shallow inspiration with bibasilar atelectasis. Developing infiltrate at the left lung base is not excluded clinical correlation is recommended. No pleural effusion or pneumothorax. The cardiac silhouette is within normal limits. Atherosclerotic calcification of the aortic arch. No acute osseous pathology. IMPRESSION: Left lung base atelectasis or developing infiltrate. Electronically Signed   By: Elgie Collard M.D.   On: 09/03/2022 17:12   _______________________________________________________________________________________________________ Latest  Blood pressure (!) 148/91, pulse 80, temperature 98.2 F (36.8 C), temperature source Axillary, resp. rate (!) 26, height 5\' 6"  (1.676 m), weight 81.6 kg, SpO2 92 %.   Vitals  labs and radiology finding personally reviewed  Review of Systems:    Pertinent positives include: fatigue chills,  Constitutional:  No weight loss, night sweats, Fevers,  fatigue, weight loss  HEENT:  No headaches, Difficulty swallowing,Tooth/dental problems,Sore throat,  No sneezing, itching, ear ache, nasal congestion, post nasal drip,  Cardio-vascular:  No chest pain, Orthopnea, PND, anasarca, dizziness, palpitations.no Bilateral lower extremity swelling  GI:  No heartburn, indigestion, abdominal pain, nausea, vomiting, diarrhea, change in bowel habits, loss of appetite, melena, blood in stool, hematemesis Resp:  no shortness of breath at rest. No dyspnea on exertion, No excess mucus, no productive cough, No  non-productive cough, No coughing up of blood.No change in color of mucus.No wheezing. Skin:  no rash or lesions. No jaundice GU:  no dysuria, change in color of urine, no urgency or frequency. No straining to urinate.  No flank pain.  Musculoskeletal:  No joint pain or no joint swelling. No decreased range of motion. No back pain.  Psych:  No change in mood or affect. No depression or anxiety. No memory loss.  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 Path confirmed intraductal papilloma    Nipple discharge    bloody from Lt breast   Psoriasis      Past Surgical History:  Procedure Laterality Date   ABDOMINAL HYSTERECTOMY  1998   full   BREAST DUCTAL SYSTEM EXCISION  06/18/2011   Procedure: EXCISION DUCTAL SYSTEM BREAST;  Surgeon: Currie Paris, MD;  Location: [facility];  Service: General;  Laterality: Left;  Left Breast Ductal Excision   BREAST EXCISIONAL BIOPSY Left    COLONOSCOPY  11/19/2010   Mild pancolonic diverticuloisis. Small internal hemorrhoids. Otherwise normal colonoscopy to terminal ileum   nasal surgery 1965     TONSILLECTOMY AND ADENOIDECTOMY  1958   TUBAL LIGATION      Social History:  Ambulatory   bed bound     reports that she has never smoked. She has never used smokeless tobacco. She reports that she does not drink alcohol and does not use drugs.     Family History:   Family History  Problem Relation Age of Onset   Cancer Mother         uterine and stomach   Stroke Father    Cancer Maternal Grandmother        breast   Breast cancer Maternal Grandmother    ______________________________________________________________________________________________ Allergies: Allergies  Allergen Reactions   Fish Oil     Heart racing   Sulfa Antibiotics Other (See Comments)   Sulfa Drugs Cross Reactors Other (See Comments)    Unknown    Zestril [Lisinopril] Cough     Prior to Admission medications   Medication Sig Start Date End Date Taking? Authorizing Provider  aspirin 81 MG chewable tablet 1 tablet    [provider]  cephALEXin (KEFLEX) 500 MG capsule Take 1 capsule (500 mg total) by mouth 2 (two) times daily. 08/26/22   Rolan Bucco, MD  memantine (NAMENDA) 10 MG tablet TAKE 1 TABLET BY MOUTH TWICE A DAY 06/10/22   Gwynneth Munson, Sung Amabile, PA-C  metoprolol succinate (TOPROL-XL) 25 MG 24 hr tablet Take 50 mg by mouth daily.  02/14/19   [provider]  rosuvastatin (CRESTOR) 20 MG tablet 20 mg. 12/22/18   [provider]  valsartan-hydrochlorothiazide (DIOVAN-HCT) 80-12.5 MG tablet Take 1 tablet by mouth daily. 1/2 tab po qd 01/10/21   [provider]    ___________________________________________________________________________________________________ Physical Exam:    09/03/2022   10:05 PM 09/03/2022    7:00 PM 09/03/2022    6:45 PM  Vitals with BMI  Systolic 148 109 161  Diastolic 91 73 71  Pulse  80 61     1. General:  in   Acute distress increased work of breathing improved with sitting up  Chronically ill -appearing 2. Psychological: Alert and  Oriented 3. Head/ENT:   Dry Mucous Membranes                          Head Non traumatic, neck supple                           Poor Dentition 4. SKIN:  decreased Skin turgor,  Skin clean Dry and intact no rash 5. Heart: Regular rate and rhythm no  Murmur, no Rub or gallop 6. Lungs:  no wheezes some crackles  7. Abdomen: Soft,   non-tender, Non distended  obese  bowel sounds present 8. Lower extremities: no clubbing,  +cyanosis, decreased cap refill left foot > right no  edema 9. Neurologically Grossly intact, moving all 4 extremities equally   10. MSK: Normal range of motion    Chart has been reviewed  ______________________________________________________________________________________________  Assessment/Plan 72 y.o. female with medical history significant of  vascular Dementia, HTN, HLD, CVA, DM2   Admitted for aspiration pNA, debility   Present on Admission:  Aspiration pneumonia (HCC)  Acute respiratory failure with hypoxia (HCC)  Type 2 diabetes mellitus with microalbuminuria, without long-term current use of insulin (HCC)  Dementia without behavioral disturbance (HCC)  Constipation     Aspiration pneumonia (HCC) Start unasyn and add azithro  - -Patient presenting with    hypoxia  , and infiltrate in   lower lobe on chest x-ray -Infiltrate on CXR and 2-3 characteristics (fever, leukocytosis, purulent sputum) are consistent with pneumonia. -This appears to be most likely community-acquired pneumonia.   -  Suspect aspiration given decreased mental status    will admit for treatment of CAP will start on appropriate antibiotic coverage. - Rocephin/azithromycin   Obtain:  sputum cultures,                                   COVID PCR negative                     blood cultures and sputum cultures ordered                   strep pneumo UA antigen,                     check for Legionella antigen.                Provide oxygen as needed.    Acute respiratory failure with hypoxia (HCC)  this patient has acute respiratory failure with Hypoxia  as documented by the presence of following: O2 saturatio< 90% on RA  Likely due to:   Pneumonia,   Provide O2 therapy and titrate as needed  Continuous pulse ox   check Pulse ox with ambulation prior to discharge   may need  TC consult for home O2 set up     flutter valve ordered   Type 2 diabetes mellitus with microalbuminuria, without long-term current use of insulin (HCC)  - Order Sensitive   SSI    -  check TSH and HgA1C     Dementia without behavioral disturbance (HCC) Progressive end stage with debility  Will order SLP  PT OT Palliative care consult   Constipation Order bowel regimen   History of CVA (cerebrovascular accident) Cont aspirin and crestor CT head non acute despite symptoms ongoing for the past 1 wk Discussed with family  Do not feel need to pursue MRI at this time    Other plan as per orders.  DVT prophylaxis:  SCD    Code Status:DNR/DNI as per family I had personally discussed CODE STATUS with  family  ACP none Family Communication:   Family not at  Bedside  plan of care was discussed on the phone with Daughter in law,  Diet     Disposition Plan:     To home once workup is complete and patient is stable   Following barriers for discharge:  Electrolytes corrected                                                              Afebrile, white count improving able to transition to PO antibiotics       Consult Orders  (From admission, onward)           Start     Ordered   09/03/22 2057  Consult to hospitalist  PG SENT BY DELORIS  Once       Provider:  (Not yet assigned)  Question Answer Comment  Place call to: [facility]   Reason for Consult Admit      09/03/22 2056                              Would benefit from PT/OT eval prior to DC  Ordered                                     Transition of care consulted                   Nutrition    consulted                  Wound care  consulted                                    Palliative care Consults called: none   Admission status:  ED Disposition     ED Disposition  Admit   Condition  --   Comment  Hospital Area: [facility] [[id number]]  Level of Care: Progressive [102]   Admit to Progressive based on following criteria: CARDIOVASCULAR & THORACIC of moderate stability with acute coronary syndrome symptoms/low risk myocardial infarction/hypertensive urgency/arrhythmias/heart failure potentially compromising stability and stable post cardiovascular intervention patients.  May place patient in observation at [facility] or [facility] if equivalent level of care is available:: No  Covid Evaluation: Asymptomatic - no recent exposure (last 10 days) testing not required  Diagnosis: Aspiration pneumonia [facility]) [[id number]]  Admitting Physician: Therisa Doyne [[id number]]  Attending Physician: Therisa Doyne [[id number]]           Obs  Level of care progressive tele indefinitely please discontinue once patient no longer qualifies COVID-19 Labs  Lab Results  Component Value Date   SARSCOV2NAA NEGATIVE 09/03/2022     Precautions: admitted as  Covid Negative  Caroline White 09/03/2022, 11:41 PM    [facility]     after 2 AM please page floor coverage PA If 7AM-7PM, please contact the day team taking care of the patient using [email or web address]

## 2022-09-03 NOTE — ED Provider Notes (Signed)
[facility] AT [facility] Provider Note   CSN: [id number] Arrival date & time: 09/03/22  1639     History  No chief complaint on file.   Caroline White is a 72 y.o. female.  She is brought in by ambulance from home, level 5 caveat secondary to dementia.  Patient was here about a week ago for weakness, found to have a UTI.  Grew out E. coli pansensitive and was treated with Keflex.  She was not admitted and did return home.  It sounds like since then she has been declining, not able to ambulate anymore, been mostly in bed, developing bedsores and increased weakness possible confusion.  She denies any headache chest pain abdominal pain although is a fairly limited historian and is not reliable.  The history is provided by the EMS personnel and the patient.  Weakness Severity:  Severe Onset quality:  Gradual Duration:  1 week Timing:  Constant Progression:  Worsening Chronicity:  New Context: urinary tract infection   Relieved by:  Nothing Ineffective treatments:  Rest Risk factors: diabetes        Home Medications Prior to Admission medications   Medication Sig Start Date End Date Taking? Authorizing Provider  aspirin 81 MG chewable tablet 1 tablet    [provider]  cephALEXin (KEFLEX) 500 MG capsule Take 1 capsule (500 mg total) by mouth 2 (two) times daily. 08/26/22   Rolan Bucco, MD  memantine (NAMENDA) 10 MG tablet TAKE 1 TABLET BY MOUTH TWICE A DAY 06/10/22   Gwynneth Munson, Sung Amabile, PA-C  metoprolol succinate (TOPROL-XL) 25 MG 24 hr tablet Take 50 mg by mouth daily.  02/14/19   [provider]  rosuvastatin (CRESTOR) 20 MG tablet 20 mg. 12/22/18   [provider]  valsartan-hydrochlorothiazide (DIOVAN-HCT) 80-12.5 MG tablet Take 1 tablet by mouth daily. 1/2 tab po qd 01/10/21   [provider]      Allergies    Fish oil, Sulfa antibiotics, Sulfa drugs cross reactors, and Zestril [lisinopril]    Review  of Systems   Review of Systems  Unable to perform ROS: Mental status change  Neurological:  Positive for weakness.    Physical Exam Updated Vital Signs BP 113/62   Pulse 80   Temp 98.1 F (36.7 C) (Oral)   Resp (!) 24   Ht 5\' 6"  (1.676 m)   Wt 81.6 kg   SpO2 98%   BMI 29.04 kg/m  Physical Exam Vitals and nursing note reviewed.  Constitutional:      General: She is not in acute distress.    Appearance: Normal appearance. She is well-developed.  HENT:     Head: Normocephalic and atraumatic.  Eyes:     Conjunctiva/sclera: Conjunctivae normal.  Cardiovascular:     Rate and Rhythm: Normal rate and regular rhythm.     Heart sounds: No murmur heard. Pulmonary:     Effort: Pulmonary effort is normal. No respiratory distress.     Breath sounds: Normal breath sounds.  Abdominal:     Palpations: Abdomen is soft.     Tenderness: There is no abdominal tenderness. There is no guarding or rebound.  Musculoskeletal:        General: Normal range of motion.     Cervical back: Neck supple.  Skin:    General: Skin is warm and dry.     Capillary Refill: Capillary refill takes less than 2 seconds.     Comments: She has some erythema on  her sacral area but I do not see any deep skin breakdown.  She is got some scabs over her knees from a possible fall.  Neurological:     General: No focal deficit present.     Mental Status: She is disoriented.     Comments: She is able to move all extremities and is following commands.  She is not oriented to time place or situation.  She does know her name.     ED Results / Procedures / Treatments   Labs (all labs ordered are listed, but only abnormal results are displayed) Labs Reviewed  CBC WITH DIFFERENTIAL/PLATELET - Abnormal; Notable for the following components:      Result Value   RBC 3.84 (*)    Hemoglobin 11.0 (*)    HCT 35.3 (*)    RDW 15.6 (*)    Neutro Abs 8.0 (*)    All other components within normal limits  COMPREHENSIVE  METABOLIC PANEL - Abnormal; Notable for the following components:   Glucose, Bld 154 (*)    Calcium 8.8 (*)    Albumin 3.0 (*)    All other components within normal limits  PREALBUMIN - Abnormal; Notable for the following components:   Prealbumin 14 (*)    All other components within normal limits  TSH - Abnormal; Notable for the following components:   TSH 4.598 (*)    All other components within normal limits  HEMOGLOBIN A1C - Abnormal; Notable for the following components:   Hgb A1c MFr Bld 5.8 (*)    All other components within normal limits  CK - Abnormal; Notable for the following components:   Total CK 493 (*)    All other components within normal limits  OSMOLALITY - Abnormal; Notable for the following components:   Osmolality 299 (*)    All other components within normal limits  CBG MONITORING, ED - Abnormal; Notable for the following components:   Glucose-Capillary 167 (*)    All other components within normal limits  I-STAT VENOUS BLOOD GAS, ED - Abnormal; Notable for the following components:   pCO2, Ven 43.0 (*)    pO2, Ven 30 (*)    Calcium, Ion 1.11 (*)    HCT 32.0 (*)    Hemoglobin 10.9 (*)    All other components within normal limits  CBG MONITORING, ED - Abnormal; Notable for the following components:   Glucose-Capillary 147 (*)    All other components within normal limits  CBG MONITORING, ED - Abnormal; Notable for the following components:   Glucose-Capillary 128 (*)    All other components within normal limits  TROPONIN I (HIGH SENSITIVITY) - Abnormal; Notable for the following components:   Troponin I (High Sensitivity) 1,232 (*)    All other components within normal limits  TROPONIN I (HIGH SENSITIVITY) - Abnormal; Notable for the following components:   Troponin I (High Sensitivity) 1,305 (*)    All other components within normal limits  CULTURE, BLOOD (ROUTINE X 2)  SARS CORONAVIRUS 2 BY RT PCR  CULTURE, BLOOD (ROUTINE X 2)  EXPECTORATED SPUTUM  ASSESSMENT W GRAM STAIN, RFLX TO RESP C  AMMONIA  MAGNESIUM  URINALYSIS, W/ REFLEX TO CULTURE (INFECTION SUSPECTED)  LACTIC ACID, PLASMA  LACTIC ACID, PLASMA  STREP PNEUMONIAE URINARY ANTIGEN  PROTIME-INR  APTT  OSMOLALITY, URINE  CREATININE, URINE, RANDOM  SODIUM, URINE, RANDOM  PROCALCITONIN  PHOSPHORUS  LEGIONELLA PNEUMOPHILA SEROGP 1 UR AG  BLOOD GAS, VENOUS  LIPID PANEL  HEPARIN LEVEL (UNFRACTIONATED)  EKG EKG Interpretation  Date/Time:  Thursday September 03 2022 20:21:28 EDT Ventricular Rate:  90 PR Interval:  201 QRS Duration: 97 QT Interval:  403 QTC Calculation: 491 R Axis:   7 Text Interpretation: Sinus rhythm Ventricular premature complex Low voltage, extremity leads Borderline prolonged QT interval No significant change since last tracing Confirmed by Meridee Score [phone]) on 09/03/2022 8:23:32 PM  Radiology DG Abd 1 View  Result Date: 09/04/2022 CLINICAL DATA:  Provided history: Constipation. EXAM: ABDOMEN - 1 VIEW COMPARISON:  CT chest/abdomen/pelvis 09/03/2022. FINDINGS: Portions of the upper abdomen are excluded from the field of view. No dilated loops of small bowel are demonstrated within the imaged abdomen to suggest small bowel obstruction. Small to moderate stool burden, greatest within the right colon. No acute osseous abnormality identified. Lumbar spondylosis. Residual contrast within the urinary bladder. IMPRESSION: 1. Portions of the upper abdomen are excluded from the field of view. 2. No dilated loops of small bowel within the imaged abdomen to suggest small bowel obstruction. 3. Small-to-moderate stool burden, greatest within the right colon. Electronically Signed   By: Jackey Loge D.O.   On: 09/04/2022 08:28   CT Head Wo Contrast  Result Date: 09/03/2022 CLINICAL DATA:  Altered mental status EXAM: CT HEAD WITHOUT CONTRAST TECHNIQUE: Contiguous axial images were obtained from the base of the skull through the vertex without intravenous contrast.  RADIATION DOSE REDUCTION: This exam was performed according to the departmental dose-optimization program which includes automated exposure control, adjustment of the mA and/or kV according to patient size and/or use of iterative reconstruction technique. COMPARISON:  02/07/2020 and 12/02/2020 FINDINGS: Brain: There is no mass, hemorrhage or extra-axial collection. There is generalized atrophy without lobar predilection. Hypodensity of the white matter is most commonly associated with chronic microvascular disease. Old right frontal infarct. Vascular: No abnormal hyperdensity of the major intracranial arteries or dural venous sinuses. No intracranial atherosclerosis. Skull: The visualized skull base, calvarium and extracranial soft tissues are normal. Sinuses/Orbits: No fluid levels or advanced mucosal thickening of the visualized paranasal sinuses. No mastoid or middle ear effusion. The orbits are normal. IMPRESSION: 1. No acute intracranial abnormality. 2. 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 Electronically Signed   By: Deatra Robinson M.D.   On: 09/03/2022 19:39   CT CHEST ABDOMEN PELVIS W CONTRAST  Result Date: 09/03/2022 CLINICAL DATA:  Sepsis EXAM: CT CHEST, ABDOMEN, AND PELVIS WITH CONTRAST TECHNIQUE: Multidetector CT imaging of the chest, abdomen and pelvis was performed following the standard protocol during bolus administration of intravenous contrast. RADIATION DOSE REDUCTION: This exam was performed according to the departmental dose-optimization program which includes automated exposure control, adjustment of the mA and/or kV according to patient size and/or use of iterative reconstruction technique. CONTRAST:  75mL OMNIPAQUE IOHEXOL 350 MG/ML SOLN COMPARISON:  Chest radiograph 09/03/2022; CT abdomen and pelvis 03/21/2020 FINDINGS: CT CHEST FINDINGS Cardiovascular: Normal heart size. No pericardial effusion. Coronary artery and aortic atherosclerotic calcification.  Mediastinum/Nodes: Unremarkable trachea and esophagus. No thoracic adenopathy. Lungs/Pleura: Elevated right hemidiaphragm. Bibasilar atelectasis. Pneumonia is difficult to exclude. Multiple small nodules are present bilaterally. For example 3 mm nodule in the right middle lobe on 5/61, 4 mm nodule in the right upper lobe on 05/30. Musculoskeletal: No acute fracture or destructive osseous lesion. CT ABDOMEN PELVIS FINDINGS Hepatobiliary: Unremarkable liver. Wall calcifications in the gallbladder fundus. Distention of the gallbladder unchanged from prior. No wall thickening or pericholecystic fluid. No biliary dilation. Pancreas: Unremarkable. Spleen: Unremarkable. Adrenals/Urinary Tract: Normal adrenal  glands. No urinary calculi or hydronephrosis. Unremarkable bladder. Stomach/Bowel: Normal caliber large and small bowel. No bowel wall thickening. Stool ball in the rectum. Normal appendix. Stomach is within normal limits. Vascular/Lymphatic: Aortic atherosclerosis. 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 No enlarged abdominal or pelvic lymph nodes. Reproductive: No acute abnormality. Other: No free intraperitoneal fluid or air. Pelvic floor laxity with rectocele. Musculoskeletal: No acute fracture or destructive osseous lesion. IMPRESSION: 1. Bibasilar atelectasis. Pneumonia is difficult to exclude. 2. Multiple small pulmonary nodules measuring up to 4 mm. No follow-up needed if patient is low-risk (and has no known or suspected primary neoplasm). Non-contrast chest CT can be considered in 12 months if patient is high-risk. This recommendation follows the consensus statement: Guidelines for Management of Incidental Pulmonary Nodules Detected on CT Images: From the Fleischner Society 2017; Radiology 2017; 284:228-243. 3. No acute abnormality in the abdomen or pelvis. Aortic Atherosclerosis (ICD10-I70.0). Electronically Signed   By: Minerva Fester M.D.   On: 09/03/2022 19:35   DG Chest Port 1 View  Result  Date: 09/03/2022 CLINICAL DATA:  Weakness. EXAM: PORTABLE CHEST 1 VIEW COMPARISON:  Chest radiograph dated 08/26/2022 head FINDINGS: There is shallow inspiration with bibasilar atelectasis. Developing infiltrate at the left lung base is not excluded clinical correlation is recommended. No pleural effusion or pneumothorax. The cardiac silhouette is within normal limits. Atherosclerotic calcification of the aortic arch. No acute osseous pathology. IMPRESSION: Left lung base atelectasis or developing infiltrate. Electronically Signed   By: Elgie Collard M.D.   On: 09/03/2022 17:12    Procedures .Critical Care  Performed by: Terrilee Files, MD Authorized by: Terrilee Files, MD   Critical care provider statement:    Critical care time (minutes):  45   Critical care time was exclusive of:  Separately billable procedures and treating other patients   Critical care was necessary to treat or prevent imminent or life-threatening deterioration of the following conditions:  CNS failure or compromise and respiratory failure   Critical care was time spent personally by me on the following activities:  Development of treatment plan with patient or surrogate, discussions with consultants, evaluation of patient's response to treatment, examination of patient, obtaining history from patient or surrogate, ordering and performing treatments and interventions, ordering and review of laboratory studies, ordering and review of radiographic studies, pulse oximetry, re-evaluation of patient's condition and review of old charts   I assumed direction of critical care for this patient from another provider in my specialty: no       Medications Ordered in ED Medications  azithromycin (ZITHROMAX) 500 mg in sodium chloride 0.9 % 250 mL IVPB (has no administration in time range)  Ampicillin-Sulbactam (UNASYN) 3 g in sodium chloride 0.9 % 100 mL IVPB (0 g Intravenous Stopped 09/04/22 0630)  0.9 %  sodium chloride  infusion ( Intravenous New Bag/Given 09/04/22 0213)  acetaminophen (TYLENOL) tablet 650 mg (has no administration in time range)    Or  acetaminophen (TYLENOL) suppository 650 mg (has no administration in time range)  albuterol (PROVENTIL) (2.5 MG/3ML) 0.083% nebulizer solution 2.5 mg (has no administration in time range)  insulin aspart (novoLOG) injection 0-9 Units (1 Units Subcutaneous Given 09/04/22 0841)  milk and molasses enema (240 mLs Rectal Not Given 09/04/22 0309)  bisacodyl (DULCOLAX) suppository 10 mg (10 mg Rectal Not Given 09/04/22 0309)  polyethylene glycol (MIRALAX / GLYCOLAX) packet 17 g (has no administration in time range)  aspirin tablet 325 mg (325 mg Oral Given 09/04/22 0436)  rosuvastatin (CRESTOR) tablet 10 mg (10 mg Oral Not Given 09/04/22 0844)  heparin ADULT infusion 100 units/mL (25000 units/25mL) (1,000 Units/hr Intravenous Infusion Verify 09/04/22 0700)  sodium chloride 0.9 % bolus 1,000 mL (0 mLs Intravenous Stopped 09/03/22 2206)  cefTRIAXone (ROCEPHIN) 1 g in sodium chloride 0.9 % 100 mL IVPB (0 g Intravenous Stopped 09/03/22 2206)  azithromycin (ZITHROMAX) 500 mg in sodium chloride 0.9 % 250 mL IVPB (0 mg Intravenous Stopped 09/03/22 2206)  iohexol (OMNIPAQUE) 350 MG/ML injection 75 mL (75 mLs Intravenous Contrast Given 09/03/22 1916)  heparin bolus via infusion 4,000 Units (4,000 Units Intravenous Bolus from Bag 09/04/22 0602)    ED Course/ Medical Decision Making/ A&P Clinical Course as of 09/04/22 0938  Thu Sep 03, 2022  1716 Chest x-ray interpreted by me as poor inspiration possible left lower lobe infiltrate. [MB]  1756 Patient's daughter is here now.  She said since she has been home she has not been able to independently transfer and she has been mostly dead weight.  She has some bruises on her arms from family trying to assist her up.  They have been working on try to get her set up with home services but this is all in process and not fully arranged yet. [MB]   2103 CT head showing no acute findings.  Old right frontal infarct and chronic microvascular changes.  CT chest abdomen and pelvis showing bibasilar atelectasis pneumonia unable to be excluded.  Multiple pulmonary nodules.  No acute findings in abdomen or pelvis.  Urinalysis came back with no gross signs of infection.  Updated daughter.  Hospitalist has been paged for admission. [MB]  2221 Discussed with [facility] hospitalist Dr. Adela Glimpse who will evaluate for admission. [MB]    Clinical Course User Index [MB] Terrilee Files, MD                             Medical Decision Making Amount and/or Complexity of Data Reviewed Labs: ordered. Radiology: ordered.  Risk Prescription drug management. Decision regarding hospitalization.   This patient complains of general weakness altered mental status lethargy; this involves an extensive number of treatment Options and is a complaint that carries with it a high risk of complications and morbidity. The differential includes infection including pneumonia, COVID, UTI, sepsis, Sirs, dehydration, renal failure, metabolic derangement  I ordered, reviewed and interpreted labs, which included CBC with normal white count stable hemoglobin, chemistries elevated glucose, lactate normal, COVID-negative, troponins elevated I ordered medication IV fluids IV antibiotics and reviewed PMP when indicated. I ordered imaging studies which included chest x-ray, CT head, CT chest abdomen pelvis and I independently    visualized and interpreted imaging which showed possible pneumonia, pulmonary nodules, old stroke no acute Additional history obtained from EMS and patient's daughter-in-law Previous records obtained and reviewed in epic including recent ED visit I consulted [facility] hospitalist Dr. Adela Glimpse and discussed lab and imaging findings and discussed disposition.  Cardiac monitoring reviewed, normal sinus rhythm Social determinants considered, no significant  barriers although patient unable to advocate for self Critical Interventions: Initiation of complex workup, broad-spectrum antibiotics and workup for sepsis SIRS altered mental status  After the interventions stated above, I reevaluated the patient and found patient still to be confused although more awake Admission and further testing considered, she would benefit from mission to the hospital for further workup.  Ultimately will need social work [facility] assistance for home health needs.  Final Clinical Impression(s) / ED Diagnoses Final diagnoses:  Acute respiratory failure with hypoxia (HCC)  Metabolic encephalopathy    Rx / DC Orders ED Discharge Orders     None         Terrilee Files, MD 09/04/22 [id number]

## 2022-09-03 NOTE — Assessment & Plan Note (Signed)
Start unasyn and add azithro  - -Patient presenting with    hypoxia  , and infiltrate in   lower lobe on chest x-ray -Infiltrate on CXR and 2-3 characteristics (fever, leukocytosis, purulent sputum) are consistent with pneumonia. -This appears to be most likely community-acquired pneumonia.   -  Suspect aspiration given decreased mental status    will admit for treatment of CAP will start on appropriate antibiotic coverage. - Rocephin/azithromycin   Obtain:  sputum cultures,                                   COVID PCR negative                     blood cultures and sputum cultures ordered                   strep pneumo UA antigen,                     check for Legionella antigen.                Provide oxygen as needed.

## 2022-09-03 NOTE — ED Triage Notes (Signed)
Seen here last week for UTI. Decline in mobility in a week. New bed sores to buttocks. Usually walks independently.   BP 128/72 HR 83 94% RA 97.2 temp

## 2022-09-03 NOTE — Assessment & Plan Note (Signed)
-   Order Sensitive SSI  °  ° -  check TSH and HgA1C °  ° ° °

## 2022-09-03 NOTE — Subjective & Objective (Signed)
Patient has been progressively weak for the past 1 week.  Now has new back bedsores on the buttocks usually was able to walk independently has been seen last week here for UTI.  On arrival blood pressure 128/72 heart rate 83 UTI urine culture grew E. coli which was pansensitive and she was treated with Keflex No associated headaches no chest pain or shortness of breath no abdominal pain

## 2022-09-04 ENCOUNTER — Observation Stay (HOSPITAL_COMMUNITY): Payer: Medicare Other

## 2022-09-04 DIAGNOSIS — Z515 Encounter for palliative care: Secondary | ICD-10-CM | POA: Diagnosis not present

## 2022-09-04 DIAGNOSIS — I739 Peripheral vascular disease, unspecified: Secondary | ICD-10-CM

## 2022-09-04 DIAGNOSIS — E785 Hyperlipidemia, unspecified: Secondary | ICD-10-CM | POA: Diagnosis present

## 2022-09-04 DIAGNOSIS — L409 Psoriasis, unspecified: Secondary | ICD-10-CM | POA: Diagnosis present

## 2022-09-04 DIAGNOSIS — I21A1 Myocardial infarction type 2: Secondary | ICD-10-CM

## 2022-09-04 DIAGNOSIS — K5900 Constipation, unspecified: Secondary | ICD-10-CM | POA: Diagnosis present

## 2022-09-04 DIAGNOSIS — Z66 Do not resuscitate: Secondary | ICD-10-CM | POA: Diagnosis present

## 2022-09-04 DIAGNOSIS — E8809 Other disorders of plasma-protein metabolism, not elsewhere classified: Secondary | ICD-10-CM | POA: Diagnosis present

## 2022-09-04 DIAGNOSIS — E669 Obesity, unspecified: Secondary | ICD-10-CM | POA: Diagnosis present

## 2022-09-04 DIAGNOSIS — R7989 Other specified abnormal findings of blood chemistry: Secondary | ICD-10-CM | POA: Diagnosis not present

## 2022-09-04 DIAGNOSIS — I10 Essential (primary) hypertension: Secondary | ICD-10-CM | POA: Diagnosis present

## 2022-09-04 DIAGNOSIS — R531 Weakness: Secondary | ICD-10-CM | POA: Diagnosis present

## 2022-09-04 DIAGNOSIS — J9601 Acute respiratory failure with hypoxia: Secondary | ICD-10-CM | POA: Diagnosis present

## 2022-09-04 DIAGNOSIS — Z6832 Body mass index (BMI) 32.0-32.9, adult: Secondary | ICD-10-CM | POA: Diagnosis not present

## 2022-09-04 DIAGNOSIS — R918 Other nonspecific abnormal finding of lung field: Secondary | ICD-10-CM | POA: Diagnosis present

## 2022-09-04 DIAGNOSIS — L89511 Pressure ulcer of right ankle, stage 1: Secondary | ICD-10-CM | POA: Diagnosis present

## 2022-09-04 DIAGNOSIS — G9341 Metabolic encephalopathy: Secondary | ICD-10-CM | POA: Diagnosis present

## 2022-09-04 DIAGNOSIS — E119 Type 2 diabetes mellitus without complications: Secondary | ICD-10-CM | POA: Diagnosis present

## 2022-09-04 DIAGNOSIS — I700 Atherosclerosis of aorta: Secondary | ICD-10-CM | POA: Diagnosis present

## 2022-09-04 DIAGNOSIS — R5381 Other malaise: Secondary | ICD-10-CM | POA: Diagnosis present

## 2022-09-04 DIAGNOSIS — I2510 Atherosclerotic heart disease of native coronary artery without angina pectoris: Secondary | ICD-10-CM | POA: Diagnosis present

## 2022-09-04 DIAGNOSIS — G9389 Other specified disorders of brain: Secondary | ICD-10-CM | POA: Diagnosis present

## 2022-09-04 DIAGNOSIS — G4710 Hypersomnia, unspecified: Secondary | ICD-10-CM | POA: Diagnosis not present

## 2022-09-04 DIAGNOSIS — Z789 Other specified health status: Secondary | ICD-10-CM | POA: Diagnosis not present

## 2022-09-04 DIAGNOSIS — J690 Pneumonitis due to inhalation of food and vomit: Secondary | ICD-10-CM | POA: Diagnosis present

## 2022-09-04 DIAGNOSIS — F0390 Unspecified dementia without behavioral disturbance: Secondary | ICD-10-CM | POA: Diagnosis not present

## 2022-09-04 DIAGNOSIS — Z1152 Encounter for screening for COVID-19: Secondary | ICD-10-CM | POA: Diagnosis not present

## 2022-09-04 DIAGNOSIS — Z7189 Other specified counseling: Secondary | ICD-10-CM | POA: Diagnosis not present

## 2022-09-04 DIAGNOSIS — R809 Proteinuria, unspecified: Secondary | ICD-10-CM | POA: Diagnosis present

## 2022-09-04 DIAGNOSIS — F0150 Vascular dementia without behavioral disturbance: Secondary | ICD-10-CM | POA: Diagnosis present

## 2022-09-04 LAB — I-STAT VENOUS BLOOD GAS, ED
Acid-Base Excess: 1 mmol/L (ref 0.0–2.0)
Bicarbonate: 26.3 mmol/L (ref 20.0–28.0)
Calcium, Ion: 1.11 mmol/L — ABNORMAL LOW (ref 1.15–1.40)
HCT: 32 % — ABNORMAL LOW (ref 36.0–46.0)
Hemoglobin: 10.9 g/dL — ABNORMAL LOW (ref 12.0–15.0)
O2 Saturation: 57 %
Potassium: 4.4 mmol/L (ref 3.5–5.1)
Sodium: 140 mmol/L (ref 135–145)
TCO2: 28 mmol/L (ref 22–32)
pCO2, Ven: 43 mmHg — ABNORMAL LOW (ref 44–60)
pH, Ven: 7.395 (ref 7.25–7.43)
pO2, Ven: 30 mmHg — CL (ref 32–45)

## 2022-09-04 LAB — ECHOCARDIOGRAM COMPLETE
AR max vel: 2.96 cm2
AV Area VTI: 3.18 cm2
AV Area mean vel: 3.14 cm2
AV Mean grad: 4 mmHg
AV Peak grad: 6.3 mmHg
Ao pk vel: 1.25 m/s
Area-P 1/2: 2.84 cm2
Est EF: 60
Height: 66 in
S' Lateral: 1.9 cm
Weight: 2878.33 oz

## 2022-09-04 LAB — CREATININE, URINE, RANDOM: Creatinine, Urine: 46 mg/dL

## 2022-09-04 LAB — LIPID PANEL
Cholesterol: 95 mg/dL (ref 0–200)
HDL: 27 mg/dL — ABNORMAL LOW (ref >40)
LDL Cholesterol: 48 mg/dL (ref 0–99)
Total CHOL/HDL Ratio: 3.5 RATIO
Triglycerides: 102 mg/dL (ref <150)
VLDL: 20 mg/dL (ref 0–40)

## 2022-09-04 LAB — STREP PNEUMONIAE URINARY ANTIGEN: Strep Pneumo Urinary Antigen: NEGATIVE

## 2022-09-04 LAB — PREALBUMIN: Prealbumin: 14 mg/dL — ABNORMAL LOW (ref 18–38)

## 2022-09-04 LAB — LACTIC ACID, PLASMA: Lactic Acid, Venous: 1.2 mmol/L (ref 0.5–1.9)

## 2022-09-04 LAB — GLUCOSE, CAPILLARY
Glucose-Capillary: 102 mg/dL — ABNORMAL HIGH (ref 70–99)
Glucose-Capillary: 102 mg/dL — ABNORMAL HIGH (ref 70–99)

## 2022-09-04 LAB — HEMOGLOBIN A1C
Hgb A1c MFr Bld: 5.8 % — ABNORMAL HIGH (ref 4.8–5.6)
Mean Plasma Glucose: 119.76 mg/dL

## 2022-09-04 LAB — TROPONIN I (HIGH SENSITIVITY)
Troponin I (High Sensitivity): 1232 ng/L (ref <18)
Troponin I (High Sensitivity): 1305 ng/L (ref <18)

## 2022-09-04 LAB — TSH: TSH: 4.598 u[IU]/mL — ABNORMAL HIGH (ref 0.350–4.500)

## 2022-09-04 LAB — CBG MONITORING, ED
Glucose-Capillary: 117 mg/dL — ABNORMAL HIGH (ref 70–99)
Glucose-Capillary: 128 mg/dL — ABNORMAL HIGH (ref 70–99)
Glucose-Capillary: 147 mg/dL — ABNORMAL HIGH (ref 70–99)
Glucose-Capillary: 167 mg/dL — ABNORMAL HIGH (ref 70–99)

## 2022-09-04 LAB — APTT: aPTT: 25 seconds (ref 24–36)

## 2022-09-04 LAB — CK: Total CK: 493 U/L — ABNORMAL HIGH (ref 38–234)

## 2022-09-04 LAB — VAS US ABI WITH/WO TBI
Left ABI: 1.28
Right ABI: 1.18

## 2022-09-04 LAB — CULTURE, BLOOD (ROUTINE X 2): Special Requests: ADEQUATE

## 2022-09-04 LAB — OSMOLALITY: Osmolality: 299 mOsm/kg — ABNORMAL HIGH (ref 275–295)

## 2022-09-04 LAB — PHOSPHORUS: Phosphorus: 3.2 mg/dL (ref 2.5–4.6)

## 2022-09-04 LAB — SODIUM, URINE, RANDOM: Sodium, Ur: 84 mmol/L

## 2022-09-04 LAB — PROTIME-INR
INR: 1.1 (ref 0.8–1.2)
Prothrombin Time: 14.3 seconds (ref 11.4–15.2)

## 2022-09-04 LAB — HEPARIN LEVEL (UNFRACTIONATED): Heparin Unfractionated: 0.48 IU/mL (ref 0.30–0.70)

## 2022-09-04 LAB — PROCALCITONIN: Procalcitonin: 0.1 ng/mL

## 2022-09-04 MED ORDER — HEPARIN BOLUS VIA INFUSION
4000.0000 [IU] | Freq: Once | INTRAVENOUS | Status: AC
  Administered 2022-09-04: 4000 [IU] via INTRAVENOUS
  Filled 2022-09-04: qty 4000

## 2022-09-04 MED ORDER — ALBUTEROL SULFATE (2.5 MG/3ML) 0.083% IN NEBU: 2.5000 mg | INHALATION_SOLUTION | RESPIRATORY_TRACT | Status: DC | PRN

## 2022-09-04 MED ORDER — ACETAMINOPHEN 325 MG PO TABS
650.0000 mg | ORAL_TABLET | Freq: Four times a day (QID) | ORAL | Status: DC | PRN
  Administered 2022-09-04 – 2022-09-07 (×3): 650 mg via ORAL
  Filled 2022-09-04 (×4): qty 2

## 2022-09-04 MED ORDER — SODIUM CHLORIDE 0.9 % IV SOLN: INTRAVENOUS | Status: AC

## 2022-09-04 MED ORDER — ACETAMINOPHEN 650 MG RE SUPP: 650.0000 mg | Freq: Four times a day (QID) | RECTAL | Status: DC | PRN

## 2022-09-04 MED ORDER — ASPIRIN 325 MG PO TABS
325.0000 mg | ORAL_TABLET | Freq: Every day | ORAL | Status: DC
  Administered 2022-09-04 – 2022-09-05 (×2): 325 mg via ORAL
  Filled 2022-09-04 (×2): qty 1

## 2022-09-04 MED ORDER — HEPARIN (PORCINE) 25000 UT/250ML-% IV SOLN
1000.0000 [IU]/h | INTRAVENOUS | Status: DC
  Administered 2022-09-04: 1000 [IU]/h via INTRAVENOUS
  Filled 2022-09-04: qty 250

## 2022-09-04 MED ORDER — ROSUVASTATIN CALCIUM 5 MG PO TABS
10.0000 mg | ORAL_TABLET | Freq: Every day | ORAL | Status: DC
  Administered 2022-09-05 – 2022-09-09 (×5): 10 mg via ORAL
  Filled 2022-09-04 (×6): qty 2

## 2022-09-04 NOTE — Consult Note (Addendum)
Cardiology Consultation   Patient ID: Caroline White MRN: [id number]; DOB: 01-22-1951  Admit date: 09/03/2022 Date of Consult: 09/04/2022  PCP:  Caroline Heys, MD   [facility] Providers Cardiologist:  Donato Schultz, MD   Patient Profile:   Caroline White is a 72 y.o. female with a hx of HTN, HLD, DM, chronic diarrhea, and vascular dementia with abnormal brain MRI concerning for stroke who is being seen 09/04/2022 for the evaluation of elevated troponin at the request of Dr. Sherryll Burger.  History of Present Illness:   [name] was seen once by cardiology in 2015 for exertional chest pain. Nuclear stress test 2015 nonischemic. She has treated hypertension.   She follows with neurology for vascular dementia. MRI brain 05/2020 showed new right frontal cortical and subcortical encephalomalacia with multiple areas of surrounding restricted diffusion compatible with acute or early subacute peri-infarct ischemia. Echocardiogram 08/2020 showed LVEF 60-65%, no significant valvular disease, negative bubble study.   Seen in the ER 08/26/2022 for AMS. At baseline, she is minimally verbal. On arrival, son felt she was back to baseline.  At that visit, she had a MOST form that confirms her wishes for DNR, DNI, no IVF, PO antibiotics only, comfort measures are prioritized. UTI treated with ABX. She was discharged without admission after IV hydration.   She presented back to [facility] 09/03/22 with weakness found to have aspiration PNA. She was found to have elevated troponin, started on heparin with cardiology evaluation and palliative care consult.   During my exam, no family was present. Pt was sleeping with [place] in place. She denies chest pain. When asked orientation questions, she fell back asleep.   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 Path confirmed intraductal papilloma    Nipple discharge    bloody from Lt breast   Psoriasis     Past Surgical History:  Procedure Laterality Date   ABDOMINAL HYSTERECTOMY  1998   full   BREAST DUCTAL SYSTEM EXCISION  06/18/2011   Procedure: EXCISION DUCTAL SYSTEM BREAST;  Surgeon: Currie Paris, MD;  Location: [facility];  Service: General;  Laterality: Left;  Left Breast Ductal Excision   BREAST EXCISIONAL BIOPSY Left    COLONOSCOPY  11/19/2010   Mild pancolonic diverticuloisis. Small internal hemorrhoids. Otherwise normal colonoscopy to terminal ileum   nasal surgery 1965     TONSILLECTOMY AND ADENOIDECTOMY  1958   TUBAL LIGATION       Home Medications:  Prior to Admission medications   Medication Sig Start Date End Date Taking? Authorizing Provider  aspirin 81 MG chewable tablet Chew 81 mg by mouth daily.   Yes [provider]  cephALEXin (KEFLEX) 500 MG capsule Take 1 capsule (500 mg total) by mouth 2 (two) times daily. 08/26/22  Yes Rolan Bucco, MD  memantine (NAMENDA) 10 MG tablet TAKE 1 TABLET BY MOUTH TWICE A DAY 06/10/22  Yes Gwynneth Munson, Sung Amabile, PA-C  metoprolol succinate (TOPROL-XL) 25 MG 24 hr tablet Take 50 mg by mouth daily.  02/14/19  Yes [provider]  rosuvastatin (CRESTOR) 20 MG tablet Take 10 mg by mouth daily. 12/22/18  Yes [provider]  valsartan-hydrochlorothiazide (DIOVAN-HCT) 80-12.5 MG tablet Take 1/2 tablet by mouth daily 01/10/21  Yes [provider]    Inpatient Medications: Scheduled Meds:  aspirin  325 mg Oral Daily   bisacodyl  10 mg Rectal Once   cyanocobalamin  1,000 mcg Intramuscular Q30 days   insulin aspart  0-9 Units Subcutaneous Q4H   milk and molasses  1 enema Rectal Once   rosuvastatin  10 mg Oral Daily   Continuous Infusions:  sodium chloride 75 mL/hr at 09/04/22 0213   ampicillin-sulbactam (UNASYN) IV Stopped (09/04/22 0630)    azithromycin     heparin 1,000 Units/hr (09/04/22 0700)   PRN Meds: acetaminophen **OR** acetaminophen, albuterol, polyethylene glycol  Allergies:    Allergies  Allergen Reactions   Fish Oil     Heart racing   Sulfa Antibiotics Other (See Comments)    Unknown reaction per daughter   Sulfa Drugs Cross Reactors Other (See Comments)    Unknown    Zestril [Lisinopril] Cough    Social History:   Social History   Socioeconomic History   Marital status: Married    Spouse name: Not on file   Number of children: Not on file   Years of education: Not on file   Highest education level: Not on file  Occupational History   Not on file  Tobacco Use   Smoking status: Never   Smokeless tobacco: Never  Vaping Use   Vaping Use: Never used  Substance and Sexual Activity   Alcohol use: No   Drug use: No   Sexual activity: Not on file  Other Topics Concern   Not on file  Social History Narrative   RIGHT HANDED    Lives at home with husband , daughter in law and son are close by    Social Determinants of Health   Financial Resource Strain: Not on file  Food Insecurity: Not on file  Transportation Needs: Not on file  Physical Activity: Not on file  Stress: Not on file  Social Connections: Not on file  Intimate Partner Violence: Not on file    Family History:    Family History  Problem Relation Age of Onset   Cancer Mother        uterine and stomach   Stroke Father    Cancer Maternal Grandmother        breast   Breast cancer Maternal Grandmother      ROS:  Please see the history of present illness.   All other ROS reviewed and negative.     Physical Exam/Data:   Vitals:   09/04/22 0700 09/04/22 0745 09/04/22 0830 09/04/22 0915  BP: 97/70 110/63 113/62 (!) 120/59  Pulse: 85 80 80 77  Resp: (!) 25 (!) 24 (!) 24 (!) 21  Temp:      TempSrc:      SpO2: 100% 99% 98% 98%  Weight:      Height:        Intake/Output Summary (Last 24 hours) at 09/04/2022 1152 Last  data filed at 09/04/2022 0700 Gross per 24 hour  Intake 1499.46 ml  Output --  Net 1499.46 ml      09/03/2022    4:44 PM 08/26/2022    1:21 PM 05/20/2022    2:53 PM  Last 3 Weights  Weight (lbs) 179 lb 14.3 oz 180 lb 189 lb  Weight (kg) 81.6 kg 81.647 kg 85.73 kg     Body mass index is 29.04 kg/m.  General:  obese female in NAD HEENT: normal Neck: no JVD Vascular: No carotid bruits; Distal pulses 2+ bilaterally Cardiac:  normal S1, S2; RRR; no murmur  Lungs:  anterior lung exam with air movement  Abd: soft, nontender, no hepatomegaly  Ext: no edema Musculoskeletal:  No deformities, BUE and BLE strength normal and equal Skin: warm and dry  Neuro:  dementia Psych:  sleeping   EKG:  The EKG was personally reviewed and demonstrates:  sinus rhythm with HR 90, poor R wave progression, repeat pending Telemetry:  Telemetry was personally reviewed and demonstrates:  sinus rhythm with HR 80s  Relevant CV Studies:  Echo 08/2020:   1. Left ventricular ejection fraction, by estimation, is 60 to 65%. The  left ventricle has normal function. The left ventricle has no regional  wall motion abnormalities. There is moderate concentric left ventricular  hypertrophy. Left ventricular  diastolic parameters were normal.   2. Right ventricular systolic function is normal. The right ventricular  size is normal. Tricuspid regurgitation signal is inadequate for assessing  PA pressure.   3. The mitral valve is grossly normal. No evidence of mitral valve  regurgitation. No evidence of mitral stenosis.   4. The aortic valve is tricuspid. There is mild calcification of the  aortic valve. Aortic valve regurgitation is not visualized. Mild aortic  valve sclerosis is present, with no evidence of aortic valve stenosis.   5. The inferior vena cava is normal in size with greater than 50%  respiratory variability, suggesting right atrial pressure of 3 mmHg.   6. Agitated saline contrast bubble study was  negative, with no evidence  of any interatrial shunt.   Laboratory Data:  High Sensitivity Troponin:   Recent Labs  Lab 08/26/22 1336 08/26/22 1701 08/26/22 2010 09/04/22 0056 09/04/22 0310  TROPONINIHS 14 21* 24* 1,232* 1,305*     Chemistry Recent Labs  Lab 09/03/22 1720 09/04/22 0106  NA 138 140  K 3.9 4.4  CL 99  --   CO2 27  --   GLUCOSE 154*  --   BUN 17  --   CREATININE 0.95  --   CALCIUM 8.8*  --   MG 1.9  --   GFRNONAA >60  --   ANIONGAP 12  --     Recent Labs  Lab 09/03/22 1720  PROT 6.5  ALBUMIN 3.0*  AST 26  ALT 27  ALKPHOS 57  BILITOT 0.8   Lipids No results for input(s): "CHOL", "TRIG", "HDL", "LABVLDL", "LDLCALC", "CHOLHDL" in the last 168 hours.  Hematology Recent Labs  Lab 09/03/22 1720 09/04/22 0106  WBC 10.4  --   RBC 3.84*  --   HGB 11.0* 10.9*  HCT 35.3* 32.0*  MCV 91.9  --   MCH 28.6  --   MCHC 31.2  --   RDW 15.6*  --   PLT 287  --    Thyroid  Recent Labs  Lab 09/04/22 0056  TSH 4.598*    BNPNo results for input(s): "BNP", "PROBNP" in the last 168 hours.  DDimer No results for input(s): "DDIMER" in the last 168 hours.   Radiology/Studies:  DG Abd 1 View  Result Date: 09/04/2022 CLINICAL DATA:  Provided history: Constipation. EXAM: ABDOMEN - 1 VIEW COMPARISON:  CT chest/abdomen/pelvis 09/03/2022. FINDINGS: Portions of the upper abdomen are excluded from the field of view. No dilated loops of small bowel are demonstrated within the imaged abdomen to suggest small bowel obstruction. Small to moderate stool burden, greatest within the right colon. No acute osseous abnormality identified. Lumbar spondylosis. Residual contrast within the urinary bladder. IMPRESSION: 1. Portions of the  upper abdomen are excluded from the field of view. 2. No dilated loops of small bowel within the imaged abdomen to suggest small bowel obstruction. 3. Small-to-moderate stool burden, greatest within the right colon. Electronically Signed   By: Jackey Loge D.O.   On: 09/04/2022 08:28   CT Head Wo Contrast  Result Date: 09/03/2022 CLINICAL DATA:  Altered mental status EXAM: CT HEAD WITHOUT CONTRAST TECHNIQUE: Contiguous axial images were obtained from the base of the skull through the vertex without intravenous contrast. RADIATION DOSE REDUCTION: This exam was performed according to the departmental dose-optimization program which includes automated exposure control, adjustment of the mA and/or kV according to patient size and/or use of iterative reconstruction technique. COMPARISON:  02/07/2020 and 12/02/2020 FINDINGS: Brain: There is no mass, hemorrhage or extra-axial collection. There is generalized atrophy without lobar predilection. Hypodensity of the white matter is most commonly associated with chronic microvascular disease. Old right frontal infarct. Vascular: No abnormal hyperdensity of the major intracranial arteries or dural venous sinuses. No intracranial atherosclerosis. Skull: The visualized skull base, calvarium and extracranial soft tissues are normal. Sinuses/Orbits: No fluid levels or advanced mucosal thickening of the visualized paranasal sinuses. No mastoid or middle ear effusion. The orbits are normal. IMPRESSION: 1. No acute intracranial abnormality. 2. 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 Electronically Signed   By: Deatra Robinson M.D.   On: 09/03/2022 19:39   CT CHEST ABDOMEN PELVIS W CONTRAST  Result Date: 09/03/2022 CLINICAL DATA:  Sepsis EXAM: CT CHEST, ABDOMEN, AND PELVIS WITH CONTRAST TECHNIQUE: Multidetector CT imaging of the chest, abdomen and pelvis was performed following the standard protocol during bolus administration of intravenous contrast. RADIATION DOSE REDUCTION: This exam was performed according to the departmental dose-optimization program which includes automated exposure control, adjustment of the mA and/or kV according to patient size and/or use of iterative reconstruction  technique. CONTRAST:  75mL OMNIPAQUE IOHEXOL 350 MG/ML SOLN COMPARISON:  Chest radiograph 09/03/2022; CT abdomen and pelvis 03/21/2020 FINDINGS: CT CHEST FINDINGS Cardiovascular: Normal heart size. No pericardial effusion. Coronary artery and aortic atherosclerotic calcification. Mediastinum/Nodes: Unremarkable trachea and esophagus. No thoracic adenopathy. Lungs/Pleura: Elevated right hemidiaphragm. Bibasilar atelectasis. Pneumonia is difficult to exclude. Multiple small nodules are present bilaterally. For example 3 mm nodule in the right middle lobe on 5/61, 4 mm nodule in the right upper lobe on 05/30. Musculoskeletal: No acute fracture or destructive osseous lesion. CT ABDOMEN PELVIS FINDINGS Hepatobiliary: Unremarkable liver. Wall calcifications in the gallbladder fundus. Distention of the gallbladder unchanged from prior. No wall thickening or pericholecystic fluid. No biliary dilation. Pancreas: Unremarkable. Spleen: Unremarkable. Adrenals/Urinary Tract: Normal adrenal glands. No urinary calculi or hydronephrosis. Unremarkable bladder. Stomach/Bowel: Normal caliber large and small bowel. No bowel wall thickening. Stool ball in the rectum. Normal appendix. Stomach is within normal limits. Vascular/Lymphatic: Aortic atherosclerosis. 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 No enlarged abdominal or pelvic lymph nodes. Reproductive: No acute abnormality. Other: No free intraperitoneal fluid or air. Pelvic floor laxity with rectocele. Musculoskeletal: No acute fracture or destructive osseous lesion. IMPRESSION: 1. Bibasilar atelectasis. Pneumonia is difficult to exclude. 2. Multiple small pulmonary nodules measuring up to 4 mm. No follow-up needed if patient is low-risk (and has no known or suspected primary neoplasm). Non-contrast chest CT can be considered in 12 months if patient is high-risk. This recommendation follows the consensus statement: Guidelines for Management of Incidental Pulmonary Nodules  Detected on CT Images: From the Fleischner Society 2017; Radiology 2017; 284:228-243. 3. No acute abnormality  in the abdomen or pelvis. Aortic Atherosclerosis (ICD10-I70.0). Electronically Signed   By: Minerva Fester M.D.   On: 09/03/2022 19:35   DG Chest Port 1 View  Result Date: 09/03/2022 CLINICAL DATA:  Weakness. EXAM: PORTABLE CHEST 1 VIEW COMPARISON:  Chest radiograph dated 08/26/2022 head FINDINGS: There is shallow inspiration with bibasilar atelectasis. Developing infiltrate at the left lung base is not excluded clinical correlation is recommended. No pleural effusion or pneumothorax. The cardiac silhouette is within normal limits. Atherosclerotic calcification of the aortic arch. No acute osseous pathology. IMPRESSION: Left lung base atelectasis or developing infiltrate. Electronically Signed   By: Elgie Collard M.D.   On: 09/03/2022 17:12     Assessment and Plan:   Likely Type II MI - CT chest with coronary and aortic atherosclerosis - HS troponin 1232 --> 1305 - heparin gtt started - limited information given patient's dementia and no family present - will return later today to speak with family members - suspect demand ischemia in the setting of aspiration PNA - pt is DNR/DNI, palliative care consulted - can stop heparin gtt - no further cardiac workup planned   Hypertension PTA: 50 mg toprol, valsartan-HCTZ Holding for borderline pressure   Hyperlipidemia On 10 mg crestor   Risk Assessment/Risk Scores:      For questions or updates, please contact [facility] Please consult [email or web address] for contact info under    Signed, Marcelino Duster, PA  09/04/2022 11:52 AM  Personally seen and examined. Agree with above.  72 year old female DNR patient with vascular dementia with MOST form that confirms her wishes for DNR/DNI no IV fluids, p.o. antibiotics only and comfort measures being seen for elevated troponin.  Troponin was 1200-1300.   Prior CT of  chest demonstrates diffuse coronary calcification. Echocardiogram 2022 shows EF of 60 to 65%  On exam, confusion, difficulty answering any questions.  Hirsutism noted.  Regular rate and rhythm.  No edema noted  Assessment and plan:  Type II MI -With her underlying coronary calcification in conjunction with her metabolic derangement in the setting of aspiration pneumonia with underlying vascular dementia it is quite possible that she is having a type II myocardial infarction secondary to oxygen supply/demand mismatch or demand ischemia.  I do not think this is acute coronary syndrome. -Given her MOST form wishes, I would stop IV heparin. -Continue to treat conservatively.  Comfort measures.  No further cardiac testing. -Agree with palliative care consultation. -She is at increased risk for morbidity mortality.  We will go ahead and sign off.  Please let us know if we can be of further assistance.  Donato Schultz, MD

## 2022-09-04 NOTE — Progress Notes (Signed)
Admission questions were answered by patients daughter in law, who stated that patient's son is away on a trip and her daughter lives in [place].  She informed me that patient had a recent fall and was recently treated for UTI.  She stated that pt has not walked for 2 weeks.

## 2022-09-04 NOTE — Progress Notes (Signed)
SLP Cancellation Note  Patient Details Name: Caroline White MRN: [id number] DOB: 1950/03/31   Cancelled treatment:       Reason Eval/Treat Not Completed: Patient at procedure or test/unavailable. Pt in a bath. Will f/u tomorrow for swallow evaluation   Alazia Crocket, Riley Nearing 09/04/2022, 2:08 PM

## 2022-09-04 NOTE — Progress Notes (Signed)
Initial Nutrition Assessment  DOCUMENTATION CODES:   Not applicable  INTERVENTION:   RD will add supplements with diet advancement   MVI po daily with diet advancement   Vitamin C 250mg  po BID with diet advancement   Pt at refeed risk  Check B12 level  NUTRITION DIAGNOSIS:   Inadequate oral intake related to dysphagia as evidenced by NPO status.  GOAL:   Patient will meet greater than or equal to 90% of their needs  MONITOR:   Diet advancement, Labs, Weight trends, Skin, I & O's  REASON FOR ASSESSMENT:   Consult Assessment of nutrition requirement/status  ASSESSMENT:   72 y.o. female with a hx of HTN, HLD, DM, vascular dementia CVA and recent UTI who is admitted with aspiration PNA.  RD working remotely.  RD unable to reach pt by phone. Per chart review, pt with progressive weakness and poor oral intake for the past week. Family reports that patient is mostly bed bound. Pt with concerns for aspiration and is NPO pending SLP evaluation. RD will add supplements and vitamins with diet advancement. Pt is at high refeed risk. Will check B12 level as pt with h/o deficiency and this can lead to weakness. Per chart, pt appears weight stable pta. GOC pending. RD will obtain nutrition related exam at follow-up.   Medications reviewed and include: aspirin, dulcolax, insulin, unasyn, erythromycin, heparin  Labs reviewed: K 4.4 wnl, P 3.2 wnl, Mg 1.9 wnl Hgb 10.9(L), Hct 32.0(L) Cbgs- 117, 128, 147, 167 x 24 hrs  AIC 5.8(H)  NUTRITION - FOCUSED PHYSICAL EXAM: Unable to perform at this time   Diet Order:   Diet Order             Diet NPO time specified Except for: Sips with Meds  Diet effective now                  EDUCATION NEEDS:   No education needs have been identified at this time  Skin:  Skin Assessment: Reviewed RN Assessment (Stage I ankle, MASD)  Last BM:  PTA  Height:   Ht Readings from Last 1 Encounters:  09/03/22 5\' 6"  (1.676 m)    Weight:    Wt Readings from Last 1 Encounters:  09/03/22 81.6 kg    Ideal Body Weight:  59 kg  BMI:  Body mass index is 29.04 kg/m.  Estimated Nutritional Needs:   Kcal:  1700-2000kcal/day  Protein:  85-100g/day  Fluid:  1.5-1.8L/day  Betsey Holiday MS, RD, LDN Please refer to [facility] for RD and/or RD on-call/weekend/after hours pager

## 2022-09-04 NOTE — ED Notes (Signed)
Patient transported to X-ray 

## 2022-09-04 NOTE — Consult Note (Signed)
WOC Nurse Consult Note: Reason for Consult: Wound consult for sacral ulcer. No sacral ulcer present.  Irritant contact dermatitis due to urinary incontinence noted. Heels intact. Dr. Sherryll Burger present for my assessment. Wound type: ICD  ICD-10 CM Codes for Irritant Dermatitis L24A2 - Due to fecal, urinary or dual incontinence L24A9 - Due to friction or contact with other specified body fluids L30.4  - Erythema intertrigo. Also used for abrasion of the hand, chafing of the skin, dermatitis due to sweating and friction, friction dermatitis, friction eczema, and genital/thigh intertrigo.  Pressure Injury POA: N/A Measurement:N/A Wound bed:N/A Drainage (amount, consistency, odor) N/A Periwound:N/A Dressing procedure/placement/frequency: Guidance is provided for skin protection in the presence of urinary incontinence including use of our house skin care products. PI prevention measures, such as the placement of a silicone foam to the sacrum ad provision of bilateral pressure redistribution heel boots are ordered.  WOC nursing team will not follow, but will remain available to this patient, the nursing and medical teams.  Please re-consult if needed.  Thank you for inviting [place] to participate in this patient's Plan of Care.  Ladona Mow, MSN, RN, CNS, GNP, Leda Min, [employer], [employer] phone:  [phone]

## 2022-09-04 NOTE — Progress Notes (Signed)
ANTICOAGULATION CONSULT NOTE - Initial Consult  Pharmacy Consult for heparin Indication:  elevated troponin  Allergies  Allergen Reactions   Fish Oil     Heart racing   Sulfa Antibiotics Other (See Comments)    Unknown reaction per daughter   Sulfa Drugs Cross Reactors Other (See Comments)    Unknown    Zestril [Lisinopril] Cough    Patient Measurements: Height: 5\' 6"  (167.6 cm) Weight: 81.6 kg (179 lb 14.3 oz) IBW/kg (Calculated) : 59.3 Heparin Dosing Weight: 75kg  Vital Signs: Temp: 98 F (36.7 C) (06/21 0200) Temp Source: Oral (06/21 0200) BP: 115/77 (06/21 0415) Pulse Rate: 83 (06/21 0415)  Labs: Recent Labs    09/03/22 1720 09/03/22 2345 09/04/22 0056 09/04/22 0106 09/04/22 0310  HGB 11.0*  --   --  10.9*  --   HCT 35.3*  --   --  32.0*  --   PLT 287  --   --   --   --   APTT  --  25  --   --   --   LABPROT  --  14.3  --   --   --   INR  --  1.1  --   --   --   CREATININE 0.95  --   --   --   --   CKTOTAL  --   --  493*  --   --   TROPONINIHS  --   --  1,232*  --  1,305*    Estimated Creatinine Clearance: 58.5 mL/min (by C-G formula based on SCr of 0.95 mg/dL).   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 Path confirmed intraductal papilloma    Nipple discharge    bloody from Lt breast   Psoriasis     Assessment: 72yo female admitted with aspiration PNA, now w/ elevated and rising troponin >> to start heparin.  Goal of Therapy:  Heparin level 0.3-0.7 units/ml Monitor platelets by anticoagulation protocol: Yes   Plan:  Heparin 4000 units IV bolus x1 followed by infusion at 1000 units/hr. Monitor heparin levels and CBC.  Vernard Gambles, PharmD, BCPS  09/04/2022,4:40 AM

## 2022-09-04 NOTE — Progress Notes (Signed)
  X-cover Note: 2nd troponin is 1305. Will start IV heparin. Will consult cards to see patient in AM. Check echo.   Carollee Herter, DO [facility]

## 2022-09-04 NOTE — Progress Notes (Signed)
PROGRESS NOTE    Lalani Winkles  UJW:[id number] DOB: 03-10-1951 DOA: 09/03/2022 PCP: Blair Heys, MD   Brief Narrative:    Caroline White is a 72 y.o. female with medical history significant of  vascular Dementia, HTN, HLD, CVA, DM2. She presented with  debility and has been getting progressively weaker over the last 1 week.  She was admitted for acute hypoxemic respiratory failure secondary to aspiration pneumonia and is now also noted to have elevated troponin for which she is on heparin drip.  Cardiology evaluation pending as well as palliative evaluation.  Assessment & Plan:   Principal Problem:   Aspiration pneumonia (HCC) Active Problems:   Type 2 diabetes mellitus with microalbuminuria, without long-term current use of insulin (HCC)   Acute respiratory failure with hypoxia (HCC)   Dementia without behavioral disturbance (HCC)   Constipation   History of CVA (cerebrovascular accident)  Assessment and Plan:   Acute hypoxemic respiratory failure secondary to aspiration pneumonia -Continue Unasyn and azithromycin -Follow-up blood and sputum cultures, blood cultures with no growth to date -Strep pneumonia antigen negative and Legionella pending  -COVID PCR negative -Wean oxygen as tolerated  Elevated troponin -Suspicion for NSTEMI and started on heparin drip -Appreciate cardiology recommendations -Currently denies chest pain  Type 2 diabetes -Continue SSI -A1c 5.8% -TSH 4.5, check free T4  Dementia without behavioral disturbance -Progressive end-stage with debility -Appreciate palliative evaluation -PT/OT/SLP  History of CVA -Continue spreading Crestor -CT head with no acute findings  DVT prophylaxis:Heparin drip Code Status: DNR Family Communication: Tried calling son with no response, none at bedside Disposition Plan:  Status is: Observation The patient will require care spanning > 2 midnights and should be moved to inpatient because:  Need for IV medications   Consultants:  Cardiology Palliative care  Procedures:  None  Antimicrobials:  Anti-infectives (From admission, onward)    Start     Dose/Rate Route Frequency Ordered Stop   09/04/22 1900  azithromycin (ZITHROMAX) 500 mg in sodium chloride 0.9 % 250 mL IVPB        500 mg 250 mL/hr over 60 Minutes Intravenous Every 24 hours 09/03/22 2229     09/04/22 1900  cefTRIAXone (ROCEPHIN) 1 g in sodium chloride 0.9 % 100 mL IVPB  Status:  Discontinued        1 g 200 mL/hr over 30 Minutes Intravenous Every 24 hours 09/03/22 2229 09/03/22 2301   09/04/22 0600  Ampicillin-Sulbactam (UNASYN) 3 g in sodium chloride 0.9 % 100 mL IVPB        3 g 200 mL/hr over 30 Minutes Intravenous Every 6 hours 09/03/22 2303     09/03/22 1845  cefTRIAXone (ROCEPHIN) 1 g in sodium chloride 0.9 % 100 mL IVPB        1 g 200 mL/hr over 30 Minutes Intravenous  Once 09/03/22 1836 09/03/22 2206   09/03/22 1845  azithromycin (ZITHROMAX) 500 mg in sodium chloride 0.9 % 250 mL IVPB        500 mg 250 mL/hr over 60 Minutes Intravenous  Once 09/03/22 1836 09/03/22 2206      Subjective: Patient seen and evaluated today with no new acute complaints or concerns. No acute concerns or events noted overnight.  She is currently minimally responsive to questioning, but denies any chest pain.  Objective: Vitals:   09/04/22 0700 09/04/22 0745 09/04/22 0830 09/04/22 0915  BP: 97/70 110/63 113/62 (!) 120/59  Pulse: 85 80 80 77  Resp: (!) 25 (!) 24 (!)  24 (!) 21  Temp:      TempSrc:      SpO2: 100% 99% 98% 98%  Weight:      Height:        Intake/Output Summary (Last 24 hours) at 09/04/2022 1012 Last data filed at 09/04/2022 0700 Gross per 24 hour  Intake 1499.46 ml  Output --  Net 1499.46 ml   Filed Weights   09/03/22 1644  Weight: 81.6 kg    Examination:  General exam: Appears calm and comfortable, minimally responsive Respiratory system: Clear to auscultation. Respiratory effort normal.   Nasal cannula. Cardiovascular system: S1 & S2 heard, RRR.  Gastrointestinal system: Abdomen is soft Central nervous system: Alert and awake Extremities: No edema Skin: No significant lesions noted Psychiatry: Flat affect.    Data Reviewed: I have personally reviewed following labs and imaging studies  CBC: Recent Labs  Lab 09/03/22 1720 09/04/22 0106  WBC 10.4  --   NEUTROABS 8.0*  --   HGB 11.0* 10.9*  HCT 35.3* 32.0*  MCV 91.9  --   PLT 287  --    Basic Metabolic Panel: Recent Labs  Lab 09/03/22 1720 09/04/22 0056 09/04/22 0106  NA 138  --  140  K 3.9  --  4.4  CL 99  --   --   CO2 27  --   --   GLUCOSE 154*  --   --   BUN 17  --   --   CREATININE 0.95  --   --   CALCIUM 8.8*  --   --   MG 1.9  --   --   PHOS  --  3.2  --    GFR: Estimated Creatinine Clearance: 58.5 mL/min (by C-G formula based on SCr of 0.95 mg/dL). Liver Function Tests: Recent Labs  Lab 09/03/22 1720  AST 26  ALT 27  ALKPHOS 57  BILITOT 0.8  PROT 6.5  ALBUMIN 3.0*   No results for input(s): "LIPASE", "AMYLASE" in the last 168 hours. Recent Labs  Lab 09/03/22 1645  AMMONIA <10   Coagulation Profile: Recent Labs  Lab 09/03/22 2345  INR 1.1   Cardiac Enzymes: Recent Labs  Lab 09/04/22 0056  CKTOTAL 493*   BNP (last 3 results) No results for input(s): "PROBNP" in the last 8760 hours. HbA1C: Recent Labs    09/04/22 0056  HGBA1C 5.8*   CBG: Recent Labs  Lab 09/04/22 0022 09/04/22 0350 09/04/22 0830  GLUCAP 167* 147* 128*   Lipid Profile: No results for input(s): "CHOL", "HDL", "LDLCALC", "TRIG", "CHOLHDL", "LDLDIRECT" in the last 72 hours. Thyroid Function Tests: Recent Labs    09/04/22 0056  TSH 4.598*   Anemia Panel: No results for input(s): "VITAMINB12", "FOLATE", "FERRITIN", "TIBC", "IRON", "RETICCTPCT" in the last 72 hours. Sepsis Labs: Recent Labs  Lab 09/03/22 1645 09/04/22 0056  PROCALCITON  --  0.10  LATICACIDVEN 1.2 1.2    Recent  Results (from the past 240 hour(s))  Urine Culture     Status: Abnormal   Collection Time: 08/26/22  1:20 PM   Specimen: Urine, Clean Catch  Result Value Ref Range Status   Specimen Description URINE, CLEAN CATCH  Final   Special Requests   Final    NONE Reflexed from [id number] Performed at [facility], [address], [place], [place] [address]    Culture >=100,000 COLONIES/mL ESCHERICHIA COLI (A)  Final   Report Status 08/28/2022 FINAL  Final   Organism ID, Bacteria ESCHERICHIA COLI (A)  Final      Susceptibility   Escherichia coli - MIC*    AMPICILLIN <=2 SENSITIVE Sensitive     CEFAZOLIN <=4 SENSITIVE Sensitive     CEFEPIME <=0.12 SENSITIVE Sensitive     CEFTRIAXONE <=0.25 SENSITIVE Sensitive     CIPROFLOXACIN <=0.25 SENSITIVE Sensitive     GENTAMICIN 2 SENSITIVE Sensitive     IMIPENEM 0.5 SENSITIVE Sensitive     NITROFURANTOIN <=16 SENSITIVE Sensitive     TRIMETH/SULFA <=20 SENSITIVE Sensitive     AMPICILLIN/SULBACTAM <=2 SENSITIVE Sensitive     PIP/TAZO <=4 SENSITIVE Sensitive     * >=100,000 COLONIES/mL ESCHERICHIA COLI  Culture, blood (routine x 2)     Status: None (Preliminary result)   Collection Time: 09/03/22  4:45 PM   Specimen: BLOOD LEFT ARM  Result Value Ref Range Status   Specimen Description BLOOD LEFT ARM  Final   Special Requests   Final    BOTTLES DRAWN AEROBIC AND ANAEROBIC Blood Culture adequate volume   Culture   Final    NO GROWTH < 24 HOURS Performed at [facility], [address], [place], [place] [address]    Report Status PENDING  Incomplete  SARS Coronavirus 2 by RT PCR (hospital order, performed in [facility] hospital lab) *cepheid single result test* Anterior Nasal Swab     Status: None   Collection Time: 09/03/22  5:50 PM   Specimen: Anterior Nasal Swab  Result Value Ref Range Status   SARS Coronavirus 2 by RT PCR NEGATIVE NEGATIVE Final    Comment: Performed at [facility], [address] [place], [place] [address]          Radiology Studies: DG Abd 1 View  Result Date: 09/04/2022 CLINICAL DATA:  Provided history: Constipation. EXAM: ABDOMEN - 1 VIEW COMPARISON:  CT chest/abdomen/pelvis 09/03/2022. FINDINGS: Portions of the upper abdomen are excluded from the field of view. No dilated loops of small bowel are demonstrated within the imaged abdomen to suggest small bowel obstruction. Small to moderate stool burden, greatest within the right colon. No acute osseous abnormality identified. Lumbar spondylosis. Residual contrast within the urinary bladder. IMPRESSION: 1. Portions of the upper abdomen are excluded from the field of view. 2. No dilated loops of small bowel within the imaged abdomen to suggest small bowel obstruction. 3. Small-to-moderate stool burden, greatest within the right colon. Electronically Signed   By: Jackey Loge D.O.   On: 09/04/2022 08:28   CT Head Wo Contrast  Result Date: 09/03/2022 CLINICAL DATA:  Altered mental status EXAM: CT HEAD WITHOUT CONTRAST TECHNIQUE: Contiguous axial images were obtained from the base of the skull through the vertex without intravenous contrast. RADIATION DOSE REDUCTION: This exam was performed according to the departmental dose-optimization program which includes automated exposure control, adjustment of the mA and/or kV according to patient size and/or use of iterative reconstruction technique. COMPARISON:  02/07/2020 and 12/02/2020 FINDINGS: Brain: There is no mass, hemorrhage or extra-axial collection. There is generalized atrophy without lobar predilection. Hypodensity of the white matter is most commonly associated with chronic microvascular disease. Old right frontal infarct. Vascular: No abnormal hyperdensity of the major intracranial arteries or dural venous sinuses. No intracranial atherosclerosis. Skull: The visualized skull base, calvarium and extracranial soft tissues are normal. Sinuses/Orbits: No fluid levels or advanced mucosal thickening of the  visualized paranasal sinuses. No mastoid or middle ear effusion. The orbits are normal. IMPRESSION: 1. No acute intracranial abnormality. 2. Old right frontal infarct and findings  of chronic microvascular disease. Electronically Signed   By: Deatra Robinson M.D.   On: 09/03/2022 19:39   CT CHEST ABDOMEN PELVIS W CONTRAST  Result Date: 09/03/2022 CLINICAL DATA:  Sepsis EXAM: CT CHEST, ABDOMEN, AND PELVIS WITH CONTRAST TECHNIQUE: Multidetector CT imaging of the chest, abdomen and pelvis was performed following the standard protocol during bolus administration of intravenous contrast. RADIATION DOSE REDUCTION: This exam was performed according to the departmental dose-optimization program which includes automated exposure control, adjustment of the mA and/or kV according to patient size and/or use of iterative reconstruction technique. CONTRAST:  75mL OMNIPAQUE IOHEXOL 350 MG/ML SOLN COMPARISON:  Chest radiograph 09/03/2022; CT abdomen and pelvis 03/21/2020 FINDINGS: CT CHEST FINDINGS Cardiovascular: Normal heart size. No pericardial effusion. Coronary artery and aortic atherosclerotic calcification. Mediastinum/Nodes: Unremarkable trachea and esophagus. No thoracic adenopathy. Lungs/Pleura: Elevated right hemidiaphragm. Bibasilar atelectasis. Pneumonia is difficult to exclude. Multiple small nodules are present bilaterally. For example 3 mm nodule in the right middle lobe on 5/61, 4 mm nodule in the right upper lobe on 05/30. Musculoskeletal: No acute fracture or destructive osseous lesion. CT ABDOMEN PELVIS FINDINGS Hepatobiliary: Unremarkable liver. Wall calcifications in the gallbladder fundus. Distention of the gallbladder unchanged from prior. No wall thickening or pericholecystic fluid. No biliary dilation. Pancreas: Unremarkable. Spleen: Unremarkable. Adrenals/Urinary Tract: Normal adrenal glands. No urinary calculi or hydronephrosis. Unremarkable bladder. Stomach/Bowel: Normal caliber large and small bowel.  No bowel wall thickening. Stool ball in the rectum. Normal appendix. Stomach is within normal limits. Vascular/Lymphatic: Aortic atherosclerosis. 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 No enlarged abdominal or pelvic lymph nodes. Reproductive: No acute abnormality. Other: No free intraperitoneal fluid or air. Pelvic floor laxity with rectocele. Musculoskeletal: No acute fracture or destructive osseous lesion. IMPRESSION: 1. Bibasilar atelectasis. Pneumonia is difficult to exclude. 2. Multiple small pulmonary nodules measuring up to 4 mm. No follow-up needed if patient is low-risk (and has no known or suspected primary neoplasm). Non-contrast chest CT can be considered in 12 months if patient is high-risk. This recommendation follows the consensus statement: Guidelines for Management of Incidental Pulmonary Nodules Detected on CT Images: From the Fleischner Society 2017; Radiology 2017; 284:228-243. 3. No acute abnormality in the abdomen or pelvis. Aortic Atherosclerosis (ICD10-I70.0). Electronically Signed   By: Minerva Fester M.D.   On: 09/03/2022 19:35   DG Chest Port 1 View  Result Date: 09/03/2022 CLINICAL DATA:  Weakness. EXAM: PORTABLE CHEST 1 VIEW COMPARISON:  Chest radiograph dated 08/26/2022 head FINDINGS: There is shallow inspiration with bibasilar atelectasis. Developing infiltrate at the left lung base is not excluded clinical correlation is recommended. No pleural effusion or pneumothorax. The cardiac silhouette is within normal limits. Atherosclerotic calcification of the aortic arch. No acute osseous pathology. IMPRESSION: Left lung base atelectasis or developing infiltrate. Electronically Signed   By: Elgie Collard M.D.   On: 09/03/2022 17:12        Scheduled Meds:  aspirin  325 mg Oral Daily   bisacodyl  10 mg Rectal Once   cyanocobalamin  1,000 mcg Intramuscular Q30 days   insulin aspart  0-9 Units Subcutaneous Q4H   milk and molasses  1 enema Rectal Once   rosuvastatin   10 mg Oral Daily   Continuous Infusions:  sodium chloride 75 mL/hr at 09/04/22 0213   ampicillin-sulbactam (UNASYN) IV Stopped (09/04/22 0630)   azithromycin     heparin 1,000 Units/hr (09/04/22 0700)     LOS: 0 days    Time spent: 35 minutes    Tajana Crotteau  Hoover Brunette, DO [facility]  If 7PM-7AM, please contact night-coverage [email or web address] 09/04/2022, 10:12 AM

## 2022-09-04 NOTE — Progress Notes (Signed)
  X-cover Note: Received call from bedside RN in ED. Initial troponin 1232.  Had troponin 9 days ago that was 24.  EKG without acute ST changes.  Will repeat 2nd troponin at 3 AM. Could be demand ischemia from aspiration pneumonia.  Carollee Herter, DO [facility]

## 2022-09-04 NOTE — Progress Notes (Signed)
ANTICOAGULATION CONSULT NOTE  Pharmacy Consult for heparin Indication:  elevated troponin  Allergies  Allergen Reactions   Fish Oil     Heart racing   Sulfa Antibiotics Other (See Comments)    Unknown reaction per daughter   Sulfa Drugs Cross Reactors Other (See Comments)    Unknown    Zestril [Lisinopril] Cough    Patient Measurements: Height: 5\' 6"  (167.6 cm) Weight: 81.6 kg (179 lb 14.3 oz) IBW/kg (Calculated) : 59.3 Heparin Dosing Weight: 75kg  Vital Signs: Temp: 98.1 F (36.7 C) (06/21 0500) Temp Source: Oral (06/21 0500) BP: 149/78 (06/21 1215) Pulse Rate: 79 (06/21 1215)  Labs: Recent Labs    09/03/22 1720 09/03/22 2345 09/04/22 0056 09/04/22 0106 09/04/22 0310 09/04/22 1202  HGB 11.0*  --   --  10.9*  --   --   HCT 35.3*  --   --  32.0*  --   --   PLT 287  --   --   --   --   --   APTT  --  25  --   --   --   --   LABPROT  --  14.3  --   --   --   --   INR  --  1.1  --   --   --   --   HEPARINUNFRC  --   --   --   --   --  0.48  CREATININE 0.95  --   --   --   --   --   CKTOTAL  --   --  493*  --   --   --   TROPONINIHS  --   --  1,232*  --  1,305*  --      Estimated Creatinine Clearance: 58.5 mL/min (by C-G formula based on SCr of 0.95 mg/dL).   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 Path confirmed intraductal papilloma    Nipple discharge    bloody from Lt breast   Psoriasis     Assessment: 72yo female admitted with aspiration PNA, now w/ elevated and rising troponin >> to start heparin.  Initial heparin level therapeutic on 1000 units/hr  Goal of Therapy:  Heparin level 0.3-0.7 units/ml Monitor platelets by anticoagulation protocol: Yes   Plan:  Continue heparin gtt at 1000 units/hr Daily heparin level, CBC, s/s  bleeding F/u cards recs and LOT  Daylene Posey, PharmD, [facility] Clinical Pharmacist ED Pharmacist Phone # [phone] 09/04/2022 12:51 PM

## 2022-09-04 NOTE — ED Notes (Signed)
Dr Imogene Burn notified of troponin of 1232.  V/O to repeat trop at 0300

## 2022-09-04 NOTE — Progress Notes (Signed)
PT Cancellation Note  Patient Details Name: Caroline White MRN: [id number] DOB: 1950/09/12   Cancelled Treatment:    Reason Eval/Treat Not Completed: Patient at procedure or test/unavailable. Pt leaving room with transport upon PT arrival. PT will follow up as time allows.   Arlyss Gandy 09/04/2022, 1:38 PM

## 2022-09-04 NOTE — Progress Notes (Signed)
ABI study completed.   Please see CV Procedures for preliminary results.  Tatijana Bierly, RVT  1:56 PM 09/04/22

## 2022-09-04 NOTE — ED Notes (Signed)
ED TO INPATIENT HANDOFF REPORT  ED Nurse Name and Phone #: [phone]  S Name/Age/Gender Caroline White 72 y.o. female Room/Bed: 009C/009C  Code Status   Code Status: DNR  Home/SNF/Other Palliative care consult Patient oriented to: self Is this baseline? Yes   Triage Complete: Triage complete  Chief Complaint Aspiration pneumonia [facility]) [J69.0]  Triage Note Seen here last week for UTI. Decline in mobility in a week. New bed sores to buttocks. Usually walks independently.   BP 128/72 HR 83 94% RA 97.2 temp   Allergies Allergies  Allergen Reactions   Fish Oil     Heart racing   Sulfa Antibiotics Other (See Comments)    Unknown reaction per daughter   Sulfa Drugs Cross Reactors Other (See Comments)    Unknown    Zestril [Lisinopril] Cough    Level of Care/Admitting Diagnosis ED Disposition     ED Disposition  Admit   Condition  --   Comment  Hospital Area: [facility] [[id number]]  Level of Care: Progressive [102]  Admit to Progressive based on following criteria: CARDIOVASCULAR & THORACIC of moderate stability with acute coronary syndrome symptoms/low risk myocardial infarction/hypertensive urgency/arrhythmias/heart failure potentially compromising stability and stable post cardiovascular intervention patients.  May place patient in observation at [facility] or [facility] if equivalent level of care is available:: No  Covid Evaluation: Asymptomatic - no recent exposure (last 10 days) testing not required  Diagnosis: Aspiration pneumonia [facility]) [[id number]]  Admitting Physician: Therisa Doyne [[id number]]  Attending Physician: Therisa Doyne [[id number]]          B Medical/Surgery History Past Medical History:  Diagnosis Date   Diabetes mellitus    type II--no medications  diet control   History of kidney stones    "Passed"   Hyperlipidemia    Hypertension    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 Path  confirmed intraductal papilloma    Nipple discharge    bloody from Lt breast   Psoriasis    Past Surgical History:  Procedure Laterality Date   ABDOMINAL HYSTERECTOMY  1998   full   BREAST DUCTAL SYSTEM EXCISION  06/18/2011   Procedure: EXCISION DUCTAL SYSTEM BREAST;  Surgeon: Currie Paris, MD;  Location: [facility];  Service: General;  Laterality: Left;  Left Breast Ductal Excision   BREAST EXCISIONAL BIOPSY Left    COLONOSCOPY  11/19/2010   Mild pancolonic diverticuloisis. Small internal hemorrhoids. Otherwise normal colonoscopy to terminal ileum   nasal surgery 1965     TONSILLECTOMY AND ADENOIDECTOMY  1958   TUBAL LIGATION       A IV Location/Drains/Wounds Patient Lines/Drains/Airways Status     Active Line/Drains/Airways     Name Placement date Placement time Site Days   Peripheral IV 09/03/22 18 G Right Antecubital 09/03/22  1742  Antecubital  1   Peripheral IV 09/04/22 Anterior;Left Wrist 09/04/22  0545  Wrist  less than 1            Intake/Output Last 24 hours  Intake/Output Summary (Last 24 hours) at 09/04/2022 1208 Last data filed at 09/04/2022 0700 Gross per 24 hour  Intake 1499.46 ml  Output --  Net 1499.46 ml    Labs/Imaging Results for orders placed or performed during the hospital encounter of 09/03/22 (from the past 48 hour(s))  Ammonia     Status: None   Collection Time: 09/03/22  4:45 PM  Result Value Ref Range   Ammonia <10 9 - 35 umol/L  Comment: Performed at [facility], [address] [place], [place] [address]  Lactic acid, plasma     Status: None   Collection Time: 09/03/22  4:45 PM  Result Value Ref Range   Lactic Acid, Venous 1.2 0.5 - 1.9 mmol/L    Comment: Performed at [facility], [address], [place], [place] [address]  Culture, blood (routine x 2)     Status: None (Preliminary result)   Collection Time: 09/03/22  4:45 PM   Specimen: BLOOD LEFT ARM  Result Value Ref Range   Specimen Description BLOOD LEFT ARM     Special Requests      BOTTLES DRAWN AEROBIC AND ANAEROBIC Blood Culture adequate volume   Culture      NO GROWTH < 24 HOURS Performed at [facility], [address], [place], [place] [address]    Report Status PENDING   CBC with Differential     Status: Abnormal   Collection Time: 09/03/22  5:20 PM  Result Value Ref Range   WBC 10.4 4.0 - 10.5 K/uL   RBC 3.84 (L) 3.87 - 5.11 MIL/uL   Hemoglobin 11.0 (L) 12.0 - 15.0 g/dL   HCT 21.3 (L) 08.6 - 57.8 %   MCV 91.9 80.0 - 100.0 fL   MCH 28.6 26.0 - 34.0 pg   MCHC 31.2 30.0 - 36.0 g/dL   RDW 46.9 (H) 62.9 - 52.8 %   Platelets 287 150 - 400 K/uL   nRBC 0.0 0.0 - 0.2 %   Neutrophils Relative % 76 %   Neutro Abs 8.0 (H) 1.7 - 7.7 K/uL   Lymphocytes Relative 11 %   Lymphs Abs 1.2 0.7 - 4.0 K/uL   Monocytes Relative 9 %   Monocytes Absolute 1.0 0.1 - 1.0 K/uL   Eosinophils Relative 2 %   Eosinophils Absolute 0.2 0.0 - 0.5 K/uL   Basophils Relative 1 %   Basophils Absolute 0.1 0.0 - 0.1 K/uL   Immature Granulocytes 1 %   Abs Immature Granulocytes 0.05 0.00 - 0.07 K/uL    Comment: Performed at [facility], [address] [place], [place] [address]  Comprehensive metabolic panel     Status: Abnormal   Collection Time: 09/03/22  5:20 PM  Result Value Ref Range   Sodium 138 135 - 145 mmol/L   Potassium 3.9 3.5 - 5.1 mmol/L   Chloride 99 98 - 111 mmol/L   CO2 27 22 - 32 mmol/L   Glucose, Bld 154 (H) 70 - 99 mg/dL    Comment: Glucose reference range applies only to samples taken after fasting for at least 8 hours.   BUN 17 8 - 23 mg/dL   Creatinine, Ser 4.01 0.44 - 1.00 mg/dL   Calcium 8.8 (L) 8.9 - 10.3 mg/dL   Total Protein 6.5 6.5 - 8.1 g/dL   Albumin 3.0 (L) 3.5 - 5.0 g/dL   AST 26 15 - 41 U/L   ALT 27 0 - 44 U/L   Alkaline Phosphatase 57 38 - 126 U/L   Total Bilirubin 0.8 0.3 - 1.2 mg/dL   GFR, Estimated >02 >72 mL/min    Comment: (NOTE) Calculated using the CKD-EPI Creatinine Equation (2021)    Anion gap  12 5 - 15    Comment: Performed at [facility], [address], [place], [place] [address]  Magnesium     Status: None   Collection Time: 09/03/22  5:20 PM  Result  Value Ref Range   Magnesium 1.9 1.7 - 2.4 mg/dL    Comment: Performed at [facility], [address] [place], [place] [address]  SARS Coronavirus 2 by RT PCR (hospital order, performed in [facility] hospital lab) *cepheid single result test* Anterior Nasal Swab     Status: None   Collection Time: 09/03/22  5:50 PM   Specimen: Anterior Nasal Swab  Result Value Ref Range   SARS Coronavirus 2 by RT PCR NEGATIVE NEGATIVE    Comment: Performed at [facility], [address], [place], [place] [address]  Urinalysis, w/ Reflex to Culture (Infection Suspected) -Urine, Catheterized     Status: None   Collection Time: 09/03/22  8:26 PM  Result Value Ref Range   Specimen Source URINE, CATHETERIZED    Color, Urine YELLOW YELLOW   APPearance CLEAR CLEAR   Specific Gravity, Urine 1.021 1.005 - 1.030   pH 5.0 5.0 - 8.0   Glucose, UA NEGATIVE NEGATIVE mg/dL   Hgb urine dipstick NEGATIVE NEGATIVE   Bilirubin Urine NEGATIVE NEGATIVE   Ketones, ur NEGATIVE NEGATIVE mg/dL   Protein, ur NEGATIVE NEGATIVE mg/dL   Nitrite NEGATIVE NEGATIVE   Leukocytes,Ua NEGATIVE NEGATIVE   RBC / HPF 0-5 0 - 5 RBC/hpf   WBC, UA 0-5 0 - 5 WBC/hpf    Comment:        Reflex urine culture not performed if WBC <=10, OR if Squamous epithelial cells >5. If Squamous epithelial cells >5 suggest recollection.    Bacteria, UA NONE SEEN NONE SEEN   Squamous Epithelial / HPF 0-5 0 - 5 /HPF    Comment: Performed at [facility], [address], [place], [place] [address]  Strep pneumoniae urinary antigen     Status: None   Collection Time: 09/03/22  8:26 PM  Result Value Ref Range   Strep Pneumo Urinary Antigen NEGATIVE NEGATIVE    Comment:        Infection due to S. pneumoniae cannot be absolutely ruled out since the antigen  present may be below the detection limit of the test. Performed at [facility], [address] [place], [place] [address]   Osmolality, urine     Status: None   Collection Time: 09/03/22  8:26 PM  Result Value Ref Range   Osmolality, Ur 415 300 - 900 mOsm/kg    Comment: Performed at [facility], [address], [place], [place] [address]  Creatinine, urine, random     Status: None   Collection Time: 09/03/22  8:26 PM  Result Value Ref Range   Creatinine, Urine 46 mg/dL    Comment: Performed at [facility], [address], [place], [place] [address]  Sodium, urine, random     Status: None   Collection Time: 09/03/22  8:26 PM  Result Value Ref Range   Sodium, Ur 84 mmol/L    Comment: Performed at [facility], [address], [place], [place] [address]  Protime-INR     Status: None   Collection Time: 09/03/22 11:45 PM  Result Value Ref Range   Prothrombin Time 14.3 11.4 - 15.2 seconds   INR 1.1 0.8 - 1.2    Comment: (NOTE) INR goal varies based on device and disease states. Performed at [facility], [address], [place], [place] [address]   APTT     Status: None   Collection Time: 09/03/22 11:45 PM  Result Value  Ref Range   aPTT 25 24 - 36 seconds    Comment: Performed at [facility], [address], [place], [place] [address]  CBG monitoring, ED     Status: Abnormal   Collection Time: 09/04/22 12:22 AM  Result Value Ref Range   Glucose-Capillary 167 (H) 70 - 99 mg/dL    Comment: Glucose reference range applies only to samples taken after fasting for at least 8 hours.  Lactic acid, plasma     Status: None   Collection Time: 09/04/22 12:56 AM  Result Value Ref Range   Lactic Acid, Venous 1.2 0.5 - 1.9 mmol/L    Comment: Performed at [facility], [address], [place], [place] [address]  Prealbumin     Status: Abnormal   Collection Time: 09/04/22 12:56 AM  Result Value Ref Range   Prealbumin 14 (L) 18 - 38 mg/dL     Comment: Performed at [facility], [address], [place], [place] [address]  TSH     Status: Abnormal   Collection Time: 09/04/22 12:56 AM  Result Value Ref Range   TSH 4.598 (H) 0.350 - 4.500 uIU/mL    Comment: Performed by a 3rd Generation assay with a functional sensitivity of <=0.01 uIU/mL. Performed at [facility], [address] [place], [place] [address]   Hemoglobin A1c     Status: Abnormal   Collection Time: 09/04/22 12:56 AM  Result Value Ref Range   Hgb A1c MFr Bld 5.8 (H) 4.8 - 5.6 %    Comment: (NOTE) Pre diabetes:          5.7%-6.4%  Diabetes:              >6.4%  Glycemic control for   <7.0% adults with diabetes    Mean Plasma Glucose 119.76 mg/dL    Comment: Performed at [facility], [address] [place], [place] [address]  CK     Status: Abnormal   Collection Time: 09/04/22 12:56 AM  Result Value Ref Range   Total CK 493 (H) 38 - 234 U/L    Comment: Performed at [facility], [address], [place], [place] [address]  Procalcitonin     Status: None   Collection Time: 09/04/22 12:56 AM  Result Value Ref Range   Procalcitonin 0.10 ng/mL    Comment:        Interpretation: PCT (Procalcitonin) <= 0.5 ng/mL: Systemic infection (sepsis) is not likely. Local bacterial infection is possible. (NOTE)       Sepsis PCT Algorithm           Lower Respiratory Tract                                      Infection PCT Algorithm    ----------------------------     ----------------------------         PCT < 0.25 ng/mL                PCT < 0.10 ng/mL          Strongly encourage             Strongly discourage   discontinuation of antibiotics    initiation of antibiotics    ----------------------------     -----------------------------       PCT 0.25 - 0.50 ng/mL  PCT 0.10 - 0.25 ng/mL               OR       >80% decrease in PCT            Discourage initiation of                                            antibiotics       Encourage discontinuation           of antibiotics    ----------------------------     -----------------------------         PCT >= 0.50 ng/mL              PCT 0.26 - 0.50 ng/mL               AND        <80% decrease in PCT             Encourage initiation of                                             antibiotics       Encourage continuation           of antibiotics    ----------------------------     -----------------------------        PCT >= 0.50 ng/mL                  PCT > 0.50 ng/mL               AND         increase in PCT                  Strongly encourage                                      initiation of antibiotics    Strongly encourage escalation           of antibiotics                                     -----------------------------                                           PCT <= 0.25 ng/mL                                                 OR                                        > 80% decrease in PCT  Discontinue / Do not initiate                                             antibiotics  Performed at [facility], [address] [place], [place] [address]   Phosphorus     Status: None   Collection Time: 09/04/22 12:56 AM  Result Value Ref Range   Phosphorus 3.2 2.5 - 4.6 mg/dL    Comment: Performed at [facility], [address], [place], [place] [address]  Osmolality     Status: Abnormal   Collection Time: 09/04/22 12:56 AM  Result Value Ref Range   Osmolality 299 (H) 275 - 295 mOsm/kg    Comment: Performed at [facility], [address] [place], [place] [address]  Troponin I (High Sensitivity)     Status: Abnormal   Collection Time: 09/04/22 12:56 AM  Result Value Ref Range   Troponin I (High Sensitivity) 1,232 (HH) <18 ng/L    Comment: CRITICAL RESULT CALLED TO, READ BACK BY AND VERIFIED WITH MORRISON,T RN @0213  09/04/22 SATRAINR (NOTE) Elevated high sensitivity troponin I (hsTnI) values and  significant  changes across serial measurements may suggest ACS but many other  chronic and acute conditions are known to elevate hsTnI results.  Refer to the "Links" section for chest pain algorithms and additional  guidance. Performed at [facility], [address], [place], [place] [address]   I-Stat venous blood gas, ED     Status: Abnormal   Collection Time: 09/04/22  1:06 AM  Result Value Ref Range   pH, Ven 7.395 7.25 - 7.43   pCO2, Ven 43.0 (L) 44 - 60 mmHg   pO2, Ven 30 (LL) 32 - 45 mmHg   Bicarbonate 26.3 20.0 - 28.0 mmol/L   TCO2 28 22 - 32 mmol/L   O2 Saturation 57 %   Acid-Base Excess 1.0 0.0 - 2.0 mmol/L   Sodium 140 135 - 145 mmol/L   Potassium 4.4 3.5 - 5.1 mmol/L   Calcium, Ion 1.11 (L) 1.15 - 1.40 mmol/L   HCT 32.0 (L) 36.0 - 46.0 %   Hemoglobin 10.9 (L) 12.0 - 15.0 g/dL   Sample type VENOUS    Comment NOTIFIED PHYSICIAN   Troponin I (High Sensitivity)     Status: Abnormal   Collection Time: 09/04/22  3:10 AM  Result Value Ref Range   Troponin I (High Sensitivity) 1,305 (HH) <18 ng/L    Comment: CRITICAL VALUE NOTED. VALUE IS CONSISTENT WITH PREVIOUSLY REPORTED/CALLED VALUE (NOTE) Elevated high sensitivity troponin I (hsTnI) values and significant  changes across serial measurements may suggest ACS but many other  chronic and acute conditions are known to elevate hsTnI results.  Refer to the "Links" section for chest pain algorithms and additional  guidance. Performed at [facility], [address], [place], [place] [address]   CBG monitoring, ED     Status: Abnormal   Collection Time: 09/04/22  3:50 AM  Result Value Ref Range   Glucose-Capillary 147 (H) 70 - 99 mg/dL    Comment: Glucose reference range applies only to samples taken after fasting for at least 8 hours.  CBG monitoring, ED     Status: Abnormal   Collection Time: 09/04/22  8:30 AM  Result Value Ref Range   Glucose-Capillary 128 (H) 70 - 99 mg/dL  Comment: Glucose  reference range applies only to samples taken after fasting for at least 8 hours.  CBG monitoring, ED     Status: Abnormal   Collection Time: 09/04/22 11:58 AM  Result Value Ref Range   Glucose-Capillary 117 (H) 70 - 99 mg/dL    Comment: Glucose reference range applies only to samples taken after fasting for at least 8 hours.   DG Abd 1 View  Result Date: 09/04/2022 CLINICAL DATA:  Provided history: Constipation. EXAM: ABDOMEN - 1 VIEW COMPARISON:  CT chest/abdomen/pelvis 09/03/2022. FINDINGS: Portions of the upper abdomen are excluded from the field of view. No dilated loops of small bowel are demonstrated within the imaged abdomen to suggest small bowel obstruction. Small to moderate stool burden, greatest within the right colon. No acute osseous abnormality identified. Lumbar spondylosis. Residual contrast within the urinary bladder. IMPRESSION: 1. Portions of the upper abdomen are excluded from the field of view. 2. No dilated loops of small bowel within the imaged abdomen to suggest small bowel obstruction. 3. Small-to-moderate stool burden, greatest within the right colon. Electronically Signed   By: Jackey Loge D.O.   On: 09/04/2022 08:28   CT Head Wo Contrast  Result Date: 09/03/2022 CLINICAL DATA:  Altered mental status EXAM: CT HEAD WITHOUT CONTRAST TECHNIQUE: Contiguous axial images were obtained from the base of the skull through the vertex without intravenous contrast. RADIATION DOSE REDUCTION: This exam was performed according to the departmental dose-optimization program which includes automated exposure control, adjustment of the mA and/or kV according to patient size and/or use of iterative reconstruction technique. COMPARISON:  02/07/2020 and 12/02/2020 FINDINGS: Brain: There is no mass, hemorrhage or extra-axial collection. There is generalized atrophy without lobar predilection. Hypodensity of the white matter is most commonly associated with chronic microvascular disease. Old right  frontal infarct. Vascular: No abnormal hyperdensity of the major intracranial arteries or dural venous sinuses. No intracranial atherosclerosis. Skull: The visualized skull base, calvarium and extracranial soft tissues are normal. Sinuses/Orbits: No fluid levels or advanced mucosal thickening of the visualized paranasal sinuses. No mastoid or middle ear effusion. The orbits are normal. IMPRESSION: 1. No acute intracranial abnormality. 2. 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 Electronically Signed   By: Deatra Robinson M.D.   On: 09/03/2022 19:39   CT CHEST ABDOMEN PELVIS W CONTRAST  Result Date: 09/03/2022 CLINICAL DATA:  Sepsis EXAM: CT CHEST, ABDOMEN, AND PELVIS WITH CONTRAST TECHNIQUE: Multidetector CT imaging of the chest, abdomen and pelvis was performed following the standard protocol during bolus administration of intravenous contrast. RADIATION DOSE REDUCTION: This exam was performed according to the departmental dose-optimization program which includes automated exposure control, adjustment of the mA and/or kV according to patient size and/or use of iterative reconstruction technique. CONTRAST:  75mL OMNIPAQUE IOHEXOL 350 MG/ML SOLN COMPARISON:  Chest radiograph 09/03/2022; CT abdomen and pelvis 03/21/2020 FINDINGS: CT CHEST FINDINGS Cardiovascular: Normal heart size. No pericardial effusion. Coronary artery and aortic atherosclerotic calcification. Mediastinum/Nodes: Unremarkable trachea and esophagus. No thoracic adenopathy. Lungs/Pleura: Elevated right hemidiaphragm. Bibasilar atelectasis. Pneumonia is difficult to exclude. Multiple small nodules are present bilaterally. For example 3 mm nodule in the right middle lobe on 5/61, 4 mm nodule in the right upper lobe on 05/30. Musculoskeletal: No acute fracture or destructive osseous lesion. CT ABDOMEN PELVIS FINDINGS Hepatobiliary: Unremarkable liver. Wall calcifications in the gallbladder fundus. Distention of the  gallbladder unchanged from prior. No wall thickening or pericholecystic fluid. No biliary dilation. Pancreas: Unremarkable. Spleen: Unremarkable. Adrenals/Urinary Tract:  Normal adrenal glands. No urinary calculi or hydronephrosis. Unremarkable bladder. Stomach/Bowel: Normal caliber large and small bowel. No bowel wall thickening. Stool ball in the rectum. Normal appendix. Stomach is within normal limits. Vascular/Lymphatic: Aortic atherosclerosis. 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 No enlarged abdominal or pelvic lymph nodes. Reproductive: No acute abnormality. Other: No free intraperitoneal fluid or air. Pelvic floor laxity with rectocele. Musculoskeletal: No acute fracture or destructive osseous lesion. IMPRESSION: 1. Bibasilar atelectasis. Pneumonia is difficult to exclude. 2. Multiple small pulmonary nodules measuring up to 4 mm. No follow-up needed if patient is low-risk (and has no known or suspected primary neoplasm). Non-contrast chest CT can be considered in 12 months if patient is high-risk. This recommendation follows the consensus statement: Guidelines for Management of Incidental Pulmonary Nodules Detected on CT Images: From the Fleischner Society 2017; Radiology 2017; 284:228-243. 3. No acute abnormality in the abdomen or pelvis. Aortic Atherosclerosis (ICD10-I70.0). Electronically Signed   By: Minerva Fester M.D.   On: 09/03/2022 19:35   DG Chest Port 1 View  Result Date: 09/03/2022 CLINICAL DATA:  Weakness. EXAM: PORTABLE CHEST 1 VIEW COMPARISON:  Chest radiograph dated 08/26/2022 head FINDINGS: There is shallow inspiration with bibasilar atelectasis. Developing infiltrate at the left lung base is not excluded clinical correlation is recommended. No pleural effusion or pneumothorax. The cardiac silhouette is within normal limits. Atherosclerotic calcification of the aortic arch. No acute osseous pathology. IMPRESSION: Left lung base atelectasis or developing infiltrate.  Electronically Signed   By: Elgie Collard M.D.   On: 09/03/2022 17:12    Pending Labs Unresulted Labs (From admission, onward)     Start     Ordered   09/05/22 0500  Heparin level (unfractionated)  Daily,   R     See Hyperspace for full Linked Orders Report.   09/04/22 0445   09/05/22 0500  CBC  Daily,   R     See Hyperspace for full Linked Orders Report.   09/04/22 0445   09/05/22 0500  Magnesium  Tomorrow morning,   R        09/04/22 1011   09/05/22 0500  Basic metabolic panel  Tomorrow morning,   R        09/04/22 1011   09/04/22 1100  Heparin level (unfractionated)  Once-Timed,   TIMED        09/04/22 0445   09/04/22 0500  Lipid panel  Tomorrow morning,   R        09/04/22 0430   09/03/22 2301  Blood gas, venous  ONCE - STAT,   STAT        09/03/22 2300   09/03/22 2225  Expectorated Sputum Assessment w Gram Stain, Rflx to Resp Cult  Once,   R        09/03/22 2229   09/03/22 2225  Legionella Pneumophila Serogp 1 Ur Ag  Once,   URGENT        09/03/22 2229   09/03/22 1645  Culture, blood (routine x 2)  BLOOD CULTURE X 2,   R (with STAT occurrences)      09/03/22 1645   Signed and Held  Magnesium  Tomorrow morning,   R        Signed and Held   Signed and Held  Phosphorus  Tomorrow morning,   R        Signed and Held   Signed and Held  Comprehensive metabolic panel  Tomorrow morning,   R  Question:  Release to patient  Answer:  Immediate   Signed and Held   Signed and Held  CBC  Tomorrow morning,   R       Question:  Release to patient  Answer:  Immediate   Signed and Held            Vitals/Pain Today's Vitals   09/04/22 0700 09/04/22 0745 09/04/22 0830 09/04/22 0915  BP: 97/70 110/63 113/62 (!) 120/59  Pulse: 85 80 80 77  Resp: (!) 25 (!) 24 (!) 24 (!) 21  Temp:      TempSrc:      SpO2: 100% 99% 98% 98%  Weight:      Height:        Isolation Precautions No active isolations  Medications Medications  azithromycin (ZITHROMAX) 500 mg in sodium  chloride 0.9 % 250 mL IVPB (has no administration in time range)  Ampicillin-Sulbactam (UNASYN) 3 g in sodium chloride 0.9 % 100 mL IVPB (3 g Intravenous New Bag/Given 09/04/22 1201)  0.9 %  sodium chloride infusion ( Intravenous New Bag/Given 09/04/22 0213)  acetaminophen (TYLENOL) tablet 650 mg (has no administration in time range)    Or  acetaminophen (TYLENOL) suppository 650 mg (has no administration in time range)  albuterol (PROVENTIL) (2.5 MG/3ML) 0.083% nebulizer solution 2.5 mg (has no administration in time range)  insulin aspart (novoLOG) injection 0-9 Units ( Subcutaneous Not Given 09/04/22 1201)  milk and molasses enema (240 mLs Rectal Not Given 09/04/22 0309)  bisacodyl (DULCOLAX) suppository 10 mg (10 mg Rectal Not Given 09/04/22 0309)  polyethylene glycol (MIRALAX / GLYCOLAX) packet 17 g (has no administration in time range)  aspirin tablet 325 mg (325 mg Oral Given 09/04/22 0436)  rosuvastatin (CRESTOR) tablet 10 mg (10 mg Oral Not Given 09/04/22 0844)  heparin ADULT infusion 100 units/mL (25000 units/270mL) (1,000 Units/hr Intravenous Infusion Verify 09/04/22 0700)  sodium chloride 0.9 % bolus 1,000 mL (0 mLs Intravenous Stopped 09/03/22 2206)  cefTRIAXone (ROCEPHIN) 1 g in sodium chloride 0.9 % 100 mL IVPB (0 g Intravenous Stopped 09/03/22 2206)  azithromycin (ZITHROMAX) 500 mg in sodium chloride 0.9 % 250 mL IVPB (0 mg Intravenous Stopped 09/03/22 2206)  iohexol (OMNIPAQUE) 350 MG/ML injection 75 mL (75 mLs Intravenous Contrast Given 09/03/22 1916)  heparin bolus via infusion 4,000 Units (4,000 Units Intravenous Bolus from Bag 09/04/22 0602)    Mobility non-ambulatory     Focused Assessments Neuro Assessment Handoff:  Swallow screen pass? No  Cardiac Rhythm: Sinus tachycardia       Neuro Assessment: Within Defined Limits Neuro Checks:      Has TPA been given?  If patient is a Neuro Trauma and patient is going to OR before floor call report to 4N Charge nurse: [phone]  or [phone]   R Recommendations: See Admitting Provider Note  Report given to:   Additional Notes:

## 2022-09-05 DIAGNOSIS — Z66 Do not resuscitate: Secondary | ICD-10-CM

## 2022-09-05 DIAGNOSIS — G9341 Metabolic encephalopathy: Secondary | ICD-10-CM | POA: Diagnosis not present

## 2022-09-05 DIAGNOSIS — J690 Pneumonitis due to inhalation of food and vomit: Secondary | ICD-10-CM | POA: Diagnosis not present

## 2022-09-05 DIAGNOSIS — J9601 Acute respiratory failure with hypoxia: Secondary | ICD-10-CM | POA: Diagnosis not present

## 2022-09-05 DIAGNOSIS — Z515 Encounter for palliative care: Secondary | ICD-10-CM

## 2022-09-05 DIAGNOSIS — R531 Weakness: Secondary | ICD-10-CM

## 2022-09-05 DIAGNOSIS — Z789 Other specified health status: Secondary | ICD-10-CM

## 2022-09-05 LAB — BASIC METABOLIC PANEL
Anion gap: 9 (ref 5–15)
BUN: 14 mg/dL (ref 8–23)
CO2: 22 mmol/L (ref 22–32)
Calcium: 7.9 mg/dL — ABNORMAL LOW (ref 8.9–10.3)
Chloride: 107 mmol/L (ref 98–111)
Creatinine, Ser: 0.8 mg/dL (ref 0.44–1.00)
GFR, Estimated: >60 mL/min (ref >60)
Glucose, Bld: 130 mg/dL — ABNORMAL HIGH (ref 70–99)
Potassium: 3.5 mmol/L (ref 3.5–5.1)
Sodium: 138 mmol/L (ref 135–145)

## 2022-09-05 LAB — GLUCOSE, CAPILLARY
Glucose-Capillary: 127 mg/dL — ABNORMAL HIGH (ref 70–99)
Glucose-Capillary: 162 mg/dL — ABNORMAL HIGH (ref 70–99)
Glucose-Capillary: 197 mg/dL — ABNORMAL HIGH (ref 70–99)
Glucose-Capillary: 203 mg/dL — ABNORMAL HIGH (ref 70–99)
Glucose-Capillary: 84 mg/dL (ref 70–99)
Glucose-Capillary: 99 mg/dL (ref 70–99)

## 2022-09-05 LAB — CULTURE, BLOOD (ROUTINE X 2)

## 2022-09-05 LAB — MAGNESIUM: Magnesium: 1.9 mg/dL (ref 1.7–2.4)

## 2022-09-05 LAB — T4, FREE: Free T4: 1.12 ng/dL (ref 0.61–1.12)

## 2022-09-05 LAB — VITAMIN B12: Vitamin B-12: 285 pg/mL (ref 180–914)

## 2022-09-05 MED ORDER — ENOXAPARIN SODIUM 40 MG/0.4ML IJ SOSY
40.0000 mg | PREFILLED_SYRINGE | INTRAMUSCULAR | Status: DC
  Administered 2022-09-05 – 2022-09-08 (×4): 40 mg via SUBCUTANEOUS
  Filled 2022-09-05 (×4): qty 0.4

## 2022-09-05 NOTE — [place] FL2 (Signed)
[place] MEDICAID FL2 LEVEL OF CARE FORM     IDENTIFICATION  Patient Name: Johnita Palleschi Birthdate: Feb 09, 1951 Sex: female Admission Date (Current Location): 09/03/2022  [facility] and [place] Number:  Producer, television/film/video and Address:  [facility] [place], [address], [place], [place] [address]      Provider Number: [id number]  Attending Physician Name and Address:  Erick Blinks, DO  Relative Name and Phone Number:  Greig Castilla [phone]    Current Level of Care: Hospital Recommended Level of Care: Skilled Nursing Facility Prior Approval Number:    Date Approved/Denied:   PASRR Number: [id number]  Discharge Plan: SNF    Current Diagnoses: Patient Active Problem List   Diagnosis Date Noted   Aspiration pneumonia (HCC) 09/03/2022   Acute respiratory failure with hypoxia (HCC) 09/03/2022   Dementia without behavioral disturbance (HCC) 09/03/2022   Constipation 09/03/2022   History of CVA (cerebrovascular accident) 09/03/2022   Type 2 diabetes mellitus with microalbuminuria, without long-term current use of insulin (HCC) 04/25/2020   Type 2 diabetes mellitus with hyperglycemia, without long-term current use of insulin (HCC) 08/04/2019   Dyslipidemia 08/04/2019   Chest tightness 05/17/2013   Intraductal papilloma 06/03/2011    Orientation RESPIRATION BLADDER Height & Weight     Self  O2 (2 liters) Continent, External catheter Weight: 199 lb 4.7 oz (90.4 kg) Height:  5\' 6"  (167.6 cm)  BEHAVIORAL SYMPTOMS/MOOD NEUROLOGICAL BOWEL NUTRITION STATUS      Continent Diet (See dc summary)  AMBULATORY STATUS COMMUNICATION OF NEEDS Skin   Extensive Assist Verbally Normal                       Personal Care Assistance Level of Assistance  Bathing, Feeding, Dressing Bathing Assistance: Maximum assistance Feeding assistance: Limited assistance Dressing Assistance: Maximum assistance     Functional Limitations Info  Sight, Hearing, Speech  Sight Info: Adequate Hearing Info: Adequate Speech Info:  (Diffculty speaking)    SPECIAL CARE FACTORS FREQUENCY  PT (By licensed PT), OT (By licensed OT)     PT Frequency: 5xweek OT Frequency: 5xweek            Contractures Contractures Info: Not present    Additional Factors Info  Code Status, Allergies Code Status Info: DNR Allergies Info: Fish Oil  Sulfa Antibiotics  Sulfa Drugs Cross Reactors  Zestril (Lisinopril)           Current Medications (09/05/2022):  This is the current hospital active medication list Current Facility-Administered Medications  Medication Dose Route Frequency Provider Last Rate Last Admin   acetaminophen (TYLENOL) tablet 650 mg  650 mg Oral Q6H PRN Therisa Doyne, MD   650 mg at 09/04/22 2239   Or   acetaminophen (TYLENOL) suppository 650 mg  650 mg Rectal Q6H PRN Doutova, Anastassia, MD       albuterol (PROVENTIL) (2.5 MG/3ML) 0.083% nebulizer solution 2.5 mg  2.5 mg Nebulization Q2H PRN Doutova, Anastassia, MD       Ampicillin-Sulbactam (UNASYN) 3 g in sodium chloride 0.9 % 100 mL IVPB  3 g Intravenous Q6H Francena Hanly, RPH 200 mL/hr at 09/05/22 1206 3 g at 09/05/22 1206   aspirin tablet 325 mg  325 mg Oral Daily Carollee Herter, DO   325 mg at 09/05/22 1000   azithromycin (ZITHROMAX) 500 mg in sodium chloride 0.9 % 250 mL IVPB  500 mg Intravenous Q24H Therisa Doyne, MD   Stopped at 09/04/22 2039  bisacodyl (DULCOLAX) suppository 10 mg  10 mg Rectal Once Therisa Doyne, MD       enoxaparin (LOVENOX) injection 40 mg  40 mg Subcutaneous Q24H Shah, Pratik D, DO       insulin aspart (novoLOG) injection 0-9 Units  0-9 Units Subcutaneous Q4H Doutova, Anastassia, MD   3 Units at 09/05/22 1205   milk and molasses enema  1 enema Rectal Once Doutova, Anastassia, MD       polyethylene glycol (MIRALAX / GLYCOLAX) packet 17 g  17 g Oral Daily PRN Doutova, Anastassia, MD       rosuvastatin (CRESTOR) tablet 10 mg  10 mg Oral Daily Carollee Herter,  DO   10 mg at 09/05/22 1000     Discharge Medications: Please see discharge summary for a list of discharge medications.  Relevant Imaging Results:  Relevant Lab Results:   Additional Information SSN: [id number]  Oletta Lamas, MSW, [facility] Transitions [facility]  Clinical Social Worker I

## 2022-09-05 NOTE — Evaluation (Signed)
Clinical/Bedside Swallow Evaluation Patient Details  Name: Caroline White MRN: [id number] Date of Birth: 11/11/1950  Today's Date: 09/05/2022 Time: SLP Start Time (ACUTE ONLY): 1117 SLP Stop Time (ACUTE ONLY): 1130 SLP Time Calculation (min) (ACUTE ONLY): 13 min  Past Medical History:  Past Medical History:  Diagnosis Date   Diabetes mellitus    type II--no medications  diet control   History of kidney stones    "Passed"   Hyperlipidemia    Hypertension    Intraductal papilloma 06/03/2011   Ductal excision done 06/18/11. Path confirmed intraductal papilloma    Nipple discharge    bloody from Lt breast   Psoriasis    Past Surgical History:  Past Surgical History:  Procedure Laterality Date   ABDOMINAL HYSTERECTOMY  1998   full   BREAST DUCTAL SYSTEM EXCISION  06/18/2011   Procedure: EXCISION DUCTAL SYSTEM BREAST;  Surgeon: Currie Paris, MD;  Location: [facility];  Service: General;  Laterality: Left;  Left Breast Ductal Excision   BREAST EXCISIONAL BIOPSY Left    COLONOSCOPY  11/19/2010   Mild pancolonic diverticuloisis. Small internal hemorrhoids. Otherwise normal colonoscopy to terminal ileum   nasal surgery 1965     TONSILLECTOMY AND ADENOIDECTOMY  1958   TUBAL LIGATION     HPI:  Caroline White is a 72 y.o. female with medical history significant of  vascular Dementia, HTN, HLD, CVA, DM2. She presented with  debility and has been getting progressively weaker over the last 1 week.  She was admitted for acute hypoxemic respiratory failure secondary to aspiration pneumonia and is now also noted to have elevated troponin for which she is on heparin drip.  Cardiology evaluation pending as well as palliative evaluation.    Assessment / Plan / Recommendation  Clinical Impression  Pt demonstrates poor cognition and awareness, does not answer Y/N questions with accuracy. She is able to feed herself. There is immediate coughing with thin liquids. Otherwise pt tolerates  solids despite missing dentition. Goals of care seems to be pending. Would not advise prolonged use of nectar thick liquids, but for now will change to nectar to reduce aspiration until plan is more explicit. If needed SLP can do MBS to elicidate cause and severity of dysphagia. Will f/u. SLP Visit Diagnosis: Dysphagia, oropharyngeal phase (R13.12)    Aspiration Risk  Moderate aspiration risk    Diet Recommendation Dysphagia 3 (Mech soft);Nectar-thick liquid   Liquid Administration via: Cup;Straw Medication Administration: Whole meds with liquid Supervision: Patient able to self feed Postural Changes: Seated upright at 90 degrees    Other  Recommendations      Recommendations for follow up therapy are one component of a multi-disciplinary discharge planning process, led by the attending physician.  Recommendations may be updated based on patient status, additional functional criteria and insurance authorization.  Follow up Recommendations Skilled nursing-short term rehab (<3 hours/day)      Assistance Recommended at Discharge    Functional Status Assessment Patient has had a recent decline in their functional status and demonstrates the ability to make significant improvements in function in a reasonable and predictable amount of time.  Frequency and Duration min 2x/week  2 weeks       Prognosis Prognosis for improved oropharyngeal function: Fair      Swallow Study   General HPI: Caroline White is a 72 y.o. female with medical history significant of  vascular Dementia, HTN, HLD, CVA, DM2. She presented with  debility and has been getting progressively  weaker over the last 1 week.  She was admitted for acute hypoxemic respiratory failure secondary to aspiration pneumonia and is now also noted to have elevated troponin for which she is on heparin drip.  Cardiology evaluation pending as well as palliative evaluation. Type of Study: Bedside Swallow Evaluation Previous Swallow  Assessment: none Diet Prior to this Study: Dysphagia 1 (pureed);Thin liquids (Level 0) History of Recent Intubation: No Behavior/Cognition: Alert;Requires cueing;Confused;Cooperative Oral Cavity Assessment: Within Functional Limits Oral Care Completed by SLP: No Oral Cavity - Dentition: Edentulous Vision: Functional for self-feeding Self-Feeding Abilities: Able to feed self Patient Positioning: Upright in chair Baseline Vocal Quality: Normal Volitional Cough: Strong Volitional Swallow: Able to elicit    Oral/Motor/Sensory Function Overall Oral Motor/Sensory Function: Within functional limits   Ice Chips     Thin Liquid Thin Liquid: Impaired Presentation: Straw;Cup;Self Fed Pharyngeal  Phase Impairments: Cough - Immediate    Nectar Thick Nectar Thick Liquid: Within functional limits Presentation: Cup;Self Fed   Honey Thick Honey Thick Liquid: Not tested   Puree Puree: Within functional limits Presentation: Self Fed;Spoon   Solid     Solid: Within functional limits Presentation: Self Fed      [name], Riley Nearing 09/05/2022,12:13 PM

## 2022-09-05 NOTE — Evaluation (Signed)
Physical Therapy Evaluation Patient Details Name: Caroline White MRN: [id number] DOB: 08-Apr-1950 Today's Date: 09/05/2022  History of Present Illness  72 y.o. female admitted for aspiration PNA and debility. PMH: vascular Dementia, HTN, HLD, CVA, DM2. Recent admit last weak for UTI, was ambulating indep up until 2 weeks ago and she hasn't been out of bed   Clinical Impression  Pt admitted with above. Pt poor historian and is unable to care for self due to both physical/functional and cognitive deficits. Pt states she drives, does the grocery shopping and lives with her husband however per chart she lives alone with family who check in on her and she has been bed bound for the last 2 weeks. Pt to benefit from palliative consult to help family navigate pt's GOB. Pt to benefit form rehab program <3 hours a day to improve strength, endurance, balance, and functional abilities. Acute PT to cont to follow while in hospital.       Recommendations for follow up therapy are one component of a multi-disciplinary discharge planning process, led by the attending physician.  Recommendations may be updated based on patient status, additional functional criteria and insurance authorization.  Follow Up Recommendations Can patient physically be transported by private vehicle: No     Assistance Recommended at Discharge Frequent or constant Supervision/Assistance  Patient can return home with the following  Two people to help with walking and/or transfers;Two people to help with bathing/dressing/bathroom;Assistance with cooking/housework;Direct supervision/assist for medications management;Assist for transportation;Help with stairs or ramp for entrance;Direct supervision/assist for financial management    Equipment Recommendations  (TBD, may need w/c as primary mode of mobility)  Recommendations for Other Services       Functional Status Assessment Patient has had a recent decline in their functional  status and demonstrates the ability to make significant improvements in function in a reasonable and predictable amount of time.     Precautions / Restrictions Precautions Precautions: Fall Precaution Comments: expressive difficulties Required Braces or Orthoses: Other Brace (has bilat prevalon boots) Restrictions Weight Bearing Restrictions: No      Mobility  Bed Mobility Overal bed mobility: Needs Assistance Bed Mobility: Supine to Sit     Supine to sit: Max assist, +2 for physical assistance, HOB elevated     General bed mobility comments: HOB elevated, maxA for trunk elevation and LE management off EOB and to scoot hips to EOB so feet are on the floor, pt with no initiation to assist with transfer, helicopter technique used    Transfers Overall transfer level: Needs assistance Equipment used:  (face to face transfer with gait belt and bed pad) Transfers: Sit to/from Stand, Bed to chair/wheelchair/BSC Sit to Stand: Max assist, +2 physical assistance Stand pivot transfers: Max assist, +2 physical assistance         General transfer comment: pt with retropulsion, max verbal and tactile cues for anterior weight shift, PT and PT tech used bed pad to promote forward translation, pt did take a few steps to chair with maxA to promote weight shifting using bed pad    Ambulation/Gait               General Gait Details: unable this date  Stairs            Wheelchair Mobility    Modified Rankin (Stroke Patients Only) Modified Rankin (Stroke Patients Only) Pre-Morbid Rankin Score: Moderate disability Modified Rankin: Severe disability     Balance Overall balance assessment: Needs assistance Sitting-balance support: Feet supported,  No upper extremity supported Sitting balance-Leahy Scale: Fair Sitting balance - Comments: retoropulsion with activity ie. LAQ   Standing balance support: Bilateral upper extremity supported Standing balance-Leahy Scale:  Zero Standing balance comment: dependent on external support                             Pertinent Vitals/Pain Pain Assessment Pain Assessment: No/denies pain    Home Living Family/patient expects to be discharged to:: Private residence Living Arrangements: Spouse/significant other Available Help at Discharge: Family;Available PRN/intermittently Type of Home: House Home Access: Stairs to enter Entrance Stairs-Rails: Left Entrance Stairs-Number of Steps: 3   Home Layout: One level   Additional Comments: pt poor historian, per chart pt lives alone and family check on her t/o the day. Pt reports living with spouse however suspect he has passed as he isn't listed as a contact and it isn't noted in chart that patient lives with spouse    Prior Function Prior Level of Function : Needs assist             Mobility Comments: per chart pt was indep with amb until 2 weeks ago, for the last 2 weeks pt has been bed bound ADLs Comments: needs assist as of last 2 weeks     Hand Dominance   Dominant Hand: Right    Extremity/Trunk Assessment   Upper Extremity Assessment Upper Extremity Assessment: Generalized weakness (R weaker and L)    Lower Extremity Assessment Lower Extremity Assessment: Generalized weakness (R grossly 3-/5 vs L grossly 3+/5, R LE with increased tone, bilat feet with limited active dorsiflexion)    Cervical / Trunk Assessment Cervical / Trunk Assessment: Kyphotic  Communication   Communication: Expressive difficulties  Cognition Arousal/Alertness: Awake/alert Behavior During Therapy: Flat affect Overall Cognitive Status: History of cognitive impairments - at baseline                                 General Comments: pt with vascular dementia, pt stated she was in the hospital but didn't know which one and was unable to recall once told. Pt stated november for the month. Suspect pt doesn't know the date at baseline. Pt with noted  delayed response time but did follow majority fo simple commands with tactile cues and increased time.        General Comments General comments (skin integrity, edema, etc.): pt with noted increased tone in R LE, pt now in prevalon boots, pts VSS, per chart pt with new bed sores on buttocks however unobseved    Exercises     Assessment/Plan    PT Assessment Patient needs continued PT services  PT Problem List Decreased strength;Decreased activity tolerance;Decreased range of motion;Decreased balance;Decreased mobility;Decreased coordination;Decreased cognition;Decreased knowledge of use of DME;Decreased safety awareness;Decreased knowledge of precautions       PT Treatment Interventions DME instruction;Gait training;Stair training;Functional mobility training;Therapeutic activities;Therapeutic exercise;Balance training;Neuromuscular re-education;Cognitive remediation;Patient/family education    PT Goals (Current goals can be found in the Care Plan section)  Acute Rehab PT Goals Patient Stated Goal: unable to state PT Goal Formulation: Patient unable to participate in goal setting Time For Goal Achievement: 09/19/22 Potential to Achieve Goals: Fair    Frequency Min 2X/week     Co-evaluation               AM-PAC PT "6 Clicks" Mobility  Outcome Measure Help needed turning  from your back to your side while in a flat bed without using bedrails?: Total Help needed moving from lying on your back to sitting on the side of a flat bed without using bedrails?: Total Help needed moving to and from a bed to a chair (including a wheelchair)?: Total Help needed standing up from a chair using your arms (e.g., wheelchair or bedside chair)?: Total Help needed to walk in hospital room?: Total Help needed climbing 3-5 steps with a railing? : Total 6 Click Score: 6    End of Session Equipment Utilized During Treatment: Gait belt;Oxygen Activity Tolerance: Patient tolerated treatment  well Patient left: in chair;with chair alarm set;with call bell/phone within reach Nurse Communication: Mobility status;Need for lift equipment (use stedy to get pt back to bed) PT Visit Diagnosis: Unsteadiness on feet (R26.81);Muscle weakness (generalized) (M62.81);Difficulty in walking, not elsewhere classified (R26.2)    Time: 3664-4034 PT Time Calculation (min) (ACUTE ONLY): 25 min   Charges:   PT Evaluation $PT Eval Moderate Complexity: 1 Mod PT Treatments $Therapeutic Activity: 8-22 mins        Caroline White, PT, DPT Acute Rehabilitation Services Secure chat preferred Office #: [phone]   Caroline White 09/05/2022, 10:12 AM

## 2022-09-05 NOTE — Consult Note (Signed)
Consultation Note Date: 09/05/2022   Patient Name: Caroline White  DOB: 10/29/1950  MRN: [id number]  Age / Sex: 72 y.o., female  PCP: Blair Heys, MD Referring Physician: Erick Blinks, DO  Reason for Consultation: Establishing goals of care  HPI/Patient Profile: 72 y.o. female  with past medical history of vascular Dementia, HTN, HLD, CVA, and DM2 presented to ED on 09/03/2022 from home after continued decline in mobility now with new sacral wounds after UTI last week.  Patient was admitted on 09/03/2022 with acute respiratory failure with hypoxia secondary to aspiration pneumonia and elevated troponin.   Cardiology was consulted for elevated troponin -suspect it's demand ischemia in setting of aspiration pneumonia.  No further cardiac workup recommended.  Speech-language pathology evaluation indicated moderate aspiration risk.  Clinical Assessment and Goals of Care: I have reviewed medical records including EPIC notes, labs, and imaging. Received report from primary RN -no acute concerns.  RN reports patient is maximum assist and ate 100% of breakfast after transition to thicken liquids.  Went to visit patient at bedside -no family/visitors present. Patient was sitting in chair, alert, oriented to self and place only, and able to participate in simple conversation.  She is not able to make complex medical decisions at this time.  No signs or non-verbal gestures of pain or discomfort noted. No respiratory distress, increased work of breathing, or secretions noted.   Met with Caroline White via phone to discuss diagnosis, prognosis, GOC, EOL wishes, disposition, and options.  I introduced Palliative Medicine as specialized medical care for people living with serious illness. It focuses on providing relief from the symptoms and stress of a serious illness. The goal is to improve quality of life for both the patient  and the family.  Caroline White clarifies that she is patient's daughter-in-law and her husband/Caroline is patient's HCPOA.  Caroline White is currently out of town with limited cellular service - he will be returning tomorrow.  Tentative family meeting scheduled for tomorrow at 2 PM - Caroline White will call PMT to confirm or change time.  We discussed a brief life review of the patient as well as functional and nutritional status.  Patient is married -they have 1 son and 1 daughter.  Patient's husband also has dementia.  Prior to hospitalization, patient was living in a private residence with her husband.  Until recently, patient was able to ambulate around her home and complete simple tasks such as make sandwiches.  Patient did not have home health services; however, family checked in on her and her husband often as Caroline White and Caroline White live next-door.  Caroline White indicates that family noticed a decline in patient last week -she stopped walking, decreased oral intake, now with new bedsores.  Further goals of care will be completed once son/HCPOA/Caroline arrives back in town.  Questions and concerns were addressed. The patient/family was encouraged to call with questions and/or concerns. PMT number was provided.   Primary Decision Maker: HCPOA - son/Caroline White    SUMMARY OF RECOMMENDATIONS   Son/HCPOA is out of town until tomorrow. Tentative family meeting scheduled for tomorrow 6/23 at 2pm for full GOC Continue current plan of care for now Continue DNR/DNI as previously documented - durable DNR form completed and placed in shadow chart. Copy was made and will be scanned into Vynca/ACP tab PMT will continue to follow and support holistically   Code Status/Advance Care Planning: DNR  Palliative Prophylaxis:  Aspiration, Frequent Pain Assessment, Oral Care, Palliative Wound Care, and Turn Reposition  Additional  Recommendations (Limitations, Scope, Preferences): Full Scope Treatment  Psycho-social/Spiritual:  Desire  for further Chaplaincy support:no Created space and opportunity for patient and family to express thoughts and feelings regarding patient's current medical situation.  Emotional support and therapeutic listening provided.  Prognosis:  Unable to determine  Discharge Planning: To Be Determined      Primary Diagnoses: Present on Admission:  Aspiration pneumonia (HCC)  Acute respiratory failure with hypoxia (HCC)  Type 2 diabetes mellitus with microalbuminuria, without long-term current use of insulin (HCC)  Dementia without behavioral disturbance (HCC)  Constipation   I have reviewed the medical record, interviewed the patient and family, and examined the patient. The following aspects are pertinent.  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 Path confirmed intraductal papilloma    Nipple discharge    bloody from Lt breast   Psoriasis    Social History   Socioeconomic History   Marital status: Married    Spouse name: Not on file   Number of children: Not on file   Years of education: Not on file   Highest education level: Not on file  Occupational History   Not on file  Tobacco Use   Smoking status: Never   Smokeless tobacco: Never  Vaping Use   Vaping Use: Never used  Substance and Sexual Activity   Alcohol use: No   Drug use: No   Sexual activity: Not on file  Other Topics Concern   Not on file  Social History Narrative   RIGHT HANDED    Lives at home with husband , daughter in law and son are close by    Social Determinants of Health   Financial Resource Strain: Not on file  Food Insecurity: No Food Insecurity (09/04/2022)   Hunger Vital Sign    Worried About Running Out of Food in the Last Year: Never true    Ran Out of Food in the Last Year: Never true   Transportation Needs: No Transportation Needs (09/04/2022)   PRAPARE - Administrator, Civil Service (Medical): No    Lack of Transportation (Non-Medical): No  Physical Activity: Not on file  Stress: Not on file  Social Connections: Not on file   Family History  Problem Relation Age of Onset   Cancer Mother        uterine and stomach   Stroke Father    Cancer Maternal Grandmother        breast   Breast cancer Maternal Grandmother    Scheduled Meds:  aspirin  325 mg Oral Daily   bisacodyl  10 mg Rectal Once   enoxaparin (LOVENOX) injection  40 mg Subcutaneous Q24H   insulin aspart  0-9 Units Subcutaneous Q4H   milk and molasses  1 enema Rectal Once   rosuvastatin  10 mg Oral Daily   Continuous Infusions:  ampicillin-sulbactam (UNASYN) IV 3 g (09/05/22 1206)   azithromycin Stopped (09/04/22 2039)   PRN Meds:.acetaminophen **OR** acetaminophen, albuterol, polyethylene glycol Medications Prior to Admission:  Prior to Admission medications   Medication Sig Start Date End Date Taking? Authorizing Provider  aspirin 81 MG chewable tablet Chew 81 mg by mouth daily.   Yes [provider]  cephALEXin (KEFLEX) 500 MG capsule Take 1 capsule (500 mg  total) by mouth 2 (two) times daily. 08/26/22  Yes Rolan Bucco, MD  memantine (NAMENDA) 10 MG tablet TAKE 1 TABLET BY MOUTH TWICE A DAY 06/10/22  Yes Gwynneth Munson, Sung Amabile, PA-C  metoprolol succinate (TOPROL-XL) 25 MG 24 hr tablet Take 50 mg by mouth daily.  02/14/19  Yes [provider]  rosuvastatin (CRESTOR) 20 MG tablet Take 10 mg by mouth daily. 12/22/18  Yes [provider]  valsartan-hydrochlorothiazide (DIOVAN-HCT) 80-12.5 MG tablet Take 1/2 tablet by mouth daily 01/10/21  Yes [provider]   Allergies  Allergen Reactions   Fish Oil     Heart racing   Sulfa Antibiotics Other (See Comments)    Unknown reaction per daughter   Sulfa Drugs Cross Reactors Other (See Comments)    Unknown     Zestril [Lisinopril] Cough   Review of Systems  Unable to perform ROS: Dementia    Physical Exam Vitals and nursing note reviewed.  Constitutional:      General: She is not in acute distress. Pulmonary:     Effort: No respiratory distress.  Skin:    General: Skin is warm and dry.  Neurological:     Mental Status: She is alert. She is disoriented and confused.     Motor: Weakness present.  Psychiatric:        Attention and Perception: Attention normal.        Behavior: Behavior is cooperative.        Cognition and Memory: Cognition is impaired. Memory is impaired.     Vital Signs: BP (!) 143/61 (BP Location: Left Arm)   Pulse 99   Temp 98.4 F (36.9 C) (Oral)   Resp (!) 28   Ht 5\' 6"  (1.676 m)   Wt 90.4 kg   SpO2 100%   BMI 32.17 kg/m  Pain Scale: PAINAD       SpO2: SpO2: 100 % O2 Device:SpO2: 100 % O2 Flow Rate: .O2 Flow Rate (L/min): 2 L/min  IO: Intake/output summary:  Intake/Output Summary (Last 24 hours) at 09/05/2022 1215 Last data filed at 09/05/2022 0400 Gross per 24 hour  Intake 738.58 ml  Output 500 ml  Net 238.58 ml    LBM: Last BM Date : 08/30/22 (per report) Baseline Weight: Weight: 81.6 kg Most recent weight: Weight: 90.4 kg     Palliative Assessment/Data: PPS 30%     Time In: 1215 Time Out: 1345 Time Total: 90 minutes  Greater than 50%  of this time was spent counseling and coordinating care related to the above assessment and plan.  Signed by: Haskel Khan, NP   Please contact Palliative Medicine Team phone at [phone] for questions and concerns.  For individual provider: See Amion  *Portions of this note are a verbal dictation therefore any spelling and/or grammatical errors are due to the "[employer]agon [employer] One" system interpretation.

## 2022-09-05 NOTE — TOC Initial Note (Signed)
Transition of Care [facility]) - Initial/Assessment Note    Patient Details  Name: Caroline White MRN: [id number] Date of Birth: Oct 11, 1950  [facility] CM/SW Contact:    Leander Rams, LCSW Phone Number: 09/05/2022, 3:24 PM  Clinical Narrative:                 CSW met with pt at bedside alongside family present. Pt was able to communicate with CSW that she is agreeable to go to STR at [facility].   Pt daughter in law Darl Pikes delivered most information to CSW. Darl Pikes explained that her and her husband provide a lot of support for pt. They are agreeable for STR. They do not have preference.   CSW will complete fl2 and fax out. TOC will continue to follow.   Expected Discharge Plan: Skilled Nursing Facility Barriers to Discharge: Continued Medical Work up   Patient Goals and CMS Choice            Expected Discharge Plan and Services     Post Acute Care Choice: Skilled Nursing Facility Living arrangements for the past 2 months: Single Family Home                                      Prior Living Arrangements/Services Living arrangements for the past 2 months: Single Family Home Lives with:: Self, Spouse                   Activities of Daily Living Home Assistive Devices/Equipment: None (family stated she now requires) ADL Screening (condition at time of admission) Patient's cognitive ability adequate to safely complete daily activities?: No Is the patient deaf or have difficulty hearing?: No Does the patient have difficulty seeing, even when wearing glasses/contacts?: No Does the patient have difficulty concentrating, remembering, or making decisions?: Yes Patient able to express need for assistance with ADLs?: No Does the patient have difficulty dressing or bathing?: Yes Independently performs ADLs?: No Communication: Needs assistance Is this a change from baseline?: Pre-admission baseline Dressing (OT): Dependent Is this a change from  baseline?: Pre-admission baseline Grooming: Dependent Is this a change from baseline?: Pre-admission baseline Feeding: Dependent, Independent Is this a change from baseline?: Pre-admission baseline Bathing: Dependent Is this a change from baseline?: Pre-admission baseline Toileting: Dependent Is this a change from baseline?: Pre-admission baseline In/Out Bed: Dependent Is this a change from baseline?: Pre-admission baseline Walks in Home: Dependent Is this a change from baseline?: Pre-admission baseline Does the patient have difficulty walking or climbing stairs?: Yes Weakness of Legs: Both Weakness of Arms/Hands: Both  Permission Sought/Granted                  Emotional Assessment              Admission diagnosis:  Metabolic encephalopathy [G93.41] Aspiration pneumonia (HCC) [J69.0] Acute respiratory failure with hypoxia (HCC) [J96.01] Patient Active Problem List   Diagnosis Date Noted   Aspiration pneumonia (HCC) 09/03/2022   Acute respiratory failure with hypoxia (HCC) 09/03/2022   Dementia without behavioral disturbance (HCC) 09/03/2022   Constipation 09/03/2022   History of CVA (cerebrovascular accident) 09/03/2022   Type 2 diabetes mellitus with microalbuminuria, without long-term current use of insulin (HCC) 04/25/2020   Type 2 diabetes mellitus with hyperglycemia, without long-term current use of insulin (HCC) 08/04/2019   Dyslipidemia 08/04/2019   Chest tightness 05/17/2013   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 AT [facility] [address] [place] [address] Phone: [phone] Fax: [phone]     Social Determinants of Health (SDOH) Social History: SDOH Screenings   Food Insecurity: No Food Insecurity (09/04/2022)  Housing: Patient Declined (09/04/2022)  Transportation Needs: No Transportation Needs (09/04/2022)  Utilities: Not At  Risk (09/04/2022)  Tobacco Use: Low Risk  (09/03/2022)   SDOH Interventions:     Readmission Risk Interventions     No data to display         Oletta Lamas, MSW, LCSWA, LCASA Transitions of Care  Clinical Social Worker I

## 2022-09-05 NOTE — Progress Notes (Signed)
We will sign off elevated troponin thought to be primarily cardiac.  Given her desire for limited care and intervention heparin was stopped yesterday..  Please let us know if we can be of any help

## 2022-09-05 NOTE — Progress Notes (Signed)
PROGRESS NOTE    [name]  WUX:[id number] DOB: 1950-07-04 DOA: 09/03/2022 PCP: Blair Heys, MD   Brief Narrative:    Caroline White is a 72 y.o. female with medical history significant of  vascular Dementia, HTN, HLD, CVA, DM2. She presented with  debility and has been getting progressively weaker over the last 1 week.  She was admitted for acute hypoxemic respiratory failure secondary to aspiration pneumonia and is now also noted to have elevated troponin for which was started on heparin drip.  She was seen by cardiology 6/21 and heparin drip discontinued as this appears to be a type II MI.  She is high risk for morbidity and mortality and continues to remain on antibiotics as prescribed.  Palliative consultation pending.  Assessment & Plan:   Principal Problem:   Aspiration pneumonia (HCC) Active Problems:   Type 2 diabetes mellitus with microalbuminuria, without long-term current use of insulin (HCC)   Acute respiratory failure with hypoxia (HCC)   Dementia without behavioral disturbance (HCC)   Constipation   History of CVA (cerebrovascular accident)  Assessment and Plan:   Acute hypoxemic respiratory failure secondary to aspiration pneumonia -Continue Unasyn and azithromycin -Follow-up blood and sputum cultures, blood cultures with no growth to date -Strep pneumonia antigen negative and Legionella pending  -COVID PCR negative -Wean oxygen as tolerated  Elevated troponin from type II MI -No further evaluation recommended per cardiology -Heparin drip discontinued  Type 2 diabetes -Continue SSI -A1c 5.8% -TSH 4.5, free T41.12  Dementia without behavioral disturbance -Progressive end-stage with debility -Appreciate palliative evaluation -PT/OT/SLP evaluation pending -Start dysphagia 1 diet for now  History of CVA -Continue spreading Crestor -CT head with no acute findings  DVT prophylaxis: Lovenox Code Status: DNR Family Communication:  Tried calling son with no response, none at bedside Disposition Plan:  Status is: Inpatient Remains inpatient appropriate because: Need for ongoing IV medications.    Consultants:  Cardiology Palliative care  Procedures:  None  Antimicrobials:  Anti-infectives (From admission, onward)    Start     Dose/Rate Route Frequency Ordered Stop   09/04/22 1900  azithromycin (ZITHROMAX) 500 mg in sodium chloride 0.9 % 250 mL IVPB        500 mg 250 mL/hr over 60 Minutes Intravenous Every 24 hours 09/03/22 2229     09/04/22 1900  cefTRIAXone (ROCEPHIN) 1 g in sodium chloride 0.9 % 100 mL IVPB  Status:  Discontinued        1 g 200 mL/hr over 30 Minutes Intravenous Every 24 hours 09/03/22 2229 09/03/22 2301   09/04/22 0600  Ampicillin-Sulbactam (UNASYN) 3 g in sodium chloride 0.9 % 100 mL IVPB        3 g 200 mL/hr over 30 Minutes Intravenous Every 6 hours 09/03/22 2303     09/03/22 1845  cefTRIAXone (ROCEPHIN) 1 g in sodium chloride 0.9 % 100 mL IVPB        1 g 200 mL/hr over 30 Minutes Intravenous  Once 09/03/22 1836 09/03/22 2206   09/03/22 1845  azithromycin (ZITHROMAX) 500 mg in sodium chloride 0.9 % 250 mL IVPB        500 mg 250 mL/hr over 60 Minutes Intravenous  Once 09/03/22 1836 09/03/22 2206      Subjective: Patient seen and evaluated today with no new acute complaints or concerns. No acute concerns or events noted overnight.  She is somnolent, but arousable.  States that she is willing to have some breakfast.  Objective: Vitals:  09/05/22 0006 09/05/22 0413 09/05/22 0724 09/05/22 0800  BP: 137/62 (!) 119/55 (!) 140/67   Pulse: 77 69 76   Resp: 20 19 (!) 22 (!) 27  Temp: 97.8 F (36.6 C) 97.7 F (36.5 C)    TempSrc: Oral Oral    SpO2: 98%  96%   Weight:  90.4 kg    Height:        Intake/Output Summary (Last 24 hours) at 09/05/2022 0952 Last data filed at 09/05/2022 0400 Gross per 24 hour  Intake 738.58 ml  Output 500 ml  Net 238.58 ml   Filed Weights   09/03/22  1644 09/05/22 0413  Weight: 81.6 kg 90.4 kg    Examination:  General exam: Appears calm and comfortable, minimally responsive Respiratory system: Clear to auscultation. Respiratory effort normal.  Nasal cannula. Cardiovascular system: S1 & S2 heard, RRR.  Gastrointestinal system: Abdomen is soft Central nervous system: Somnolent but arousable Extremities: No edema Skin: No significant lesions noted Psychiatry: Flat affect.    Data Reviewed: I have personally reviewed following labs and imaging studies  CBC: Recent Labs  Lab 09/03/22 1720 09/04/22 0106  WBC 10.4  --   NEUTROABS 8.0*  --   HGB 11.0* 10.9*  HCT 35.3* 32.0*  MCV 91.9  --   PLT 287  --    Basic Metabolic Panel: Recent Labs  Lab 09/03/22 1720 09/04/22 0056 09/04/22 0106 09/05/22 0116  NA 138  --  140 138  K 3.9  --  4.4 3.5  CL 99  --   --  107  CO2 27  --   --  22  GLUCOSE 154*  --   --  130*  BUN 17  --   --  14  CREATININE 0.95  --   --  0.80  CALCIUM 8.8*  --   --  7.9*  MG 1.9  --   --  1.9  PHOS  --  3.2  --   --    GFR: Estimated Creatinine Clearance: 73 mL/min (by C-G formula based on SCr of 0.8 mg/dL). Liver Function Tests: Recent Labs  Lab 09/03/22 1720  AST 26  ALT 27  ALKPHOS 57  BILITOT 0.8  PROT 6.5  ALBUMIN 3.0*   No results for input(s): "LIPASE", "AMYLASE" in the last 168 hours. Recent Labs  Lab 09/03/22 1645  AMMONIA <10   Coagulation Profile: Recent Labs  Lab 09/03/22 2345  INR 1.1   Cardiac Enzymes: Recent Labs  Lab 09/04/22 0056  CKTOTAL 493*   BNP (last 3 results) No results for input(s): "PROBNP" in the last 8760 hours. HbA1C: Recent Labs    09/04/22 0056  HGBA1C 5.8*   CBG: Recent Labs  Lab 09/04/22 1629 09/04/22 2107 09/05/22 0003 09/05/22 0411 09/05/22 0721  GLUCAP 102* 102* 127* 99 84   Lipid Profile: Recent Labs    09/04/22 0310  CHOL 95  HDL 27*  LDLCALC 48  TRIG 244  CHOLHDL 3.5   Thyroid Function Tests: Recent Labs     09/04/22 0056 09/05/22 0738  TSH 4.598*  --   FREET4  --  1.12   Anemia Panel: Recent Labs    09/05/22 0116  VITAMINB12 285   Sepsis Labs: Recent Labs  Lab 09/03/22 1645 09/04/22 0056  PROCALCITON  --  0.10  LATICACIDVEN 1.2 1.2    Recent Results (from the past 240 hour(s))  Urine Culture     Status: Abnormal   Collection Time: 08/26/22  1:20  PM   Specimen: Urine, Clean Catch  Result Value Ref Range Status   Specimen Description URINE, CLEAN CATCH  Final   Special Requests   Final    NONE Reflexed from [id number] Performed at [facility], [address] [place], [place] [address]    Culture >=100,000 COLONIES/mL ESCHERICHIA COLI (A)  Final   Report Status 08/28/2022 FINAL  Final   Organism ID, Bacteria ESCHERICHIA COLI (A)  Final      Susceptibility   Escherichia coli - MIC*    AMPICILLIN <=2 SENSITIVE Sensitive     CEFAZOLIN <=4 SENSITIVE Sensitive     CEFEPIME <=0.12 SENSITIVE Sensitive     CEFTRIAXONE <=0.25 SENSITIVE Sensitive     CIPROFLOXACIN <=0.25 SENSITIVE Sensitive     GENTAMICIN 2 SENSITIVE Sensitive     IMIPENEM 0.5 SENSITIVE Sensitive     NITROFURANTOIN <=16 SENSITIVE Sensitive     TRIMETH/SULFA <=20 SENSITIVE Sensitive     AMPICILLIN/SULBACTAM <=2 SENSITIVE Sensitive     PIP/TAZO <=4 SENSITIVE Sensitive     * >=100,000 COLONIES/mL ESCHERICHIA COLI  Culture, blood (routine x 2)     Status: None (Preliminary result)   Collection Time: 09/03/22  4:45 PM   Specimen: BLOOD LEFT ARM  Result Value Ref Range Status   Specimen Description BLOOD LEFT ARM  Final   Special Requests   Final    BOTTLES DRAWN AEROBIC AND ANAEROBIC Blood Culture adequate volume   Culture   Final    NO GROWTH 2 DAYS Performed at [facility], [address], [place], [place] [address]    Report Status PENDING  Incomplete  SARS Coronavirus 2 by RT PCR (hospital order, performed in [facility] hospital lab) *cepheid single result test* Anterior Nasal Swab     Status:  None   Collection Time: 09/03/22  5:50 PM   Specimen: Anterior Nasal Swab  Result Value Ref Range Status   SARS Coronavirus 2 by RT PCR NEGATIVE NEGATIVE Final    Comment: Performed at [facility], [address] [place], [place] [address]  Culture, blood (routine x 2)     Status: None (Preliminary result)   Collection Time: 09/04/22 12:56 AM   Specimen: BLOOD LEFT ARM  Result Value Ref Range Status   Specimen Description BLOOD LEFT ARM  Final   Special Requests   Final    BOTTLES DRAWN AEROBIC AND ANAEROBIC Blood Culture results may not be optimal due to an excessive volume of blood received in culture bottles   Culture   Final    NO GROWTH 1 DAY Performed at [facility], [address] [place], [place] [address]    Report Status PENDING  Incomplete         Radiology Studies: VAS [place] ABI WITH/WO TBI  Result Date: 09/04/2022  LOWER EXTREMITY DOPPLER STUDY Patient Name:  Jerzee Jerome  Date of Exam:   09/04/2022 Medical Rec #: [id number]               Accession #:    [id number] Date of Birth: 03/18/1950              Patient Gender: F Patient Age:   60 years Exam Location:  [facility] Procedure:      VAS [place] ABI WITH/WO TBI Referring Phys: Jonny Ruiz DOUTOVA --------------------------------------------------------------------------------  Indications: Claudication. High Risk Factors: Hypertension, hyperlipidemia, Diabetes, prior CVA.  Comparison Study: No previous study. Performing Technologist: McKayla Maag RVT, [place]  Examination Guidelines: A complete evaluation includes at minimum, Doppler waveform signals and systolic blood pressure reading at the level of bilateral brachial, anterior tibial, and posterior tibial arteries, when vessel segments are accessible. Bilateral testing is considered an integral part of a complete examination. Photoelectric Plethysmograph (PPG) waveforms and toe systolic pressure readings are included as required and additional duplex  testing as needed. Limited examinations for reoccurring indications may be performed as noted.  ABI Findings: +---------+------------------+-----+---------+---------------------------------+ Right    Rt Pressure (mmHg)IndexWaveform Comment                           +---------+------------------+-----+---------+---------------------------------+ Brachial                        triphasicNo pressure obtained due to IV                                             placement                         +---------+------------------+-----+---------+---------------------------------+ PTA      158               1.18 triphasic                                  +---------+------------------+-----+---------+---------------------------------+ DP       150               1.12 triphasic                                  +---------+------------------+-----+---------+---------------------------------+ Great Toe118               0.88 Normal                                     +---------+------------------+-----+---------+---------------------------------+ +---------+------------------+-----+---------+-------+ Left     Lt Pressure (mmHg)IndexWaveform Comment +---------+------------------+-----+---------+-------+ Brachial 134                    triphasic        +---------+------------------+-----+---------+-------+ PTA      156               1.16 triphasic        +---------+------------------+-----+---------+-------+ DP       171               1.28 triphasic        +---------+------------------+-----+---------+-------+ Great Toe104               0.78 Normal           +---------+------------------+-----+---------+-------+ +-------+-----------+-----------+------------+------------+ ABI/TBIToday's ABIToday's TBIPrevious ABIPrevious TBI +-------+-----------+-----------+------------+------------+ Right  1.18       0.88                                 +-------+-----------+-----------+------------+------------+ Left   1.28       0.78                                +-------+-----------+-----------+------------+------------+  *  See table(s) above for measurements and observations.  Electronically signed by Waverly Ferrari MD on 09/04/2022 at 5:07:24 PM.    Final    ECHOCARDIOGRAM COMPLETE  Result Date: 09/04/2022    ECHOCARDIOGRAM REPORT   Patient Name:   Jernee MERRIT Kassing Date of Exam: 09/04/2022 Medical Rec #:  [id number]              Height:       66.0 in Accession #:    [id number]             Weight:       179.9 lb Date of Birth:  Jun 11, 1950             BSA:          1.912 m Patient Age:    71 years               BP:           120/59 mmHg Patient Gender: F                      HR:           84 bpm. Exam Location:  Inpatient Procedure: 2D Echo, Cardiac Doppler and Color Doppler Indications:    Elevated Troponin  History:        Patient has prior history of Echocardiogram examinations, most                 recent 09/11/2020. Stroke and Dementia; Risk Factors:Diabetes,                 Dyslipidemia and Non-Smoker.  Sonographer:    Dondra Prader RVT RCS Referring Phys: [id number] ERIC CHEN  Sonographer Comments: Technically challenging study due to limited acoustic windows, Technically difficult study due to poor echo windows, suboptimal apical window, suboptimal parasternal window and suboptimal subcostal window. Image acquisition challenging due to patient body habitus. Patient was combative and uncooperative throughout exam. Ended exam. IMPRESSIONS  1. Left ventricular ejection fraction, by estimation, is 60%. The left ventricle has normal function. The left ventricle has no regional wall motion abnormalities. There is severe left ventricular hypertrophy of the septal segment. Left ventricular diastolic parameters are consistent with Grade I diastolic dysfunction (impaired relaxation).  2. Right ventricular systolic function is normal. The right ventricular  size is mildly enlarged. There is normal pulmonary artery systolic pressure. The estimated right ventricular systolic pressure is 25.5 mmHg.  3. The mitral valve is normal in structure. No evidence of mitral valve regurgitation. No evidence of mitral stenosis.  4. The aortic valve is tricuspid. There is mild calcification of the aortic valve. Aortic valve regurgitation is not visualized. No aortic stenosis is present.  5. The inferior vena cava is normal in size with greater than 50% respiratory variability, suggesting right atrial pressure of 3 mmHg. FINDINGS  Left Ventricle: Left ventricular ejection fraction, by estimation, is 60%. The left ventricle has normal function. The left ventricle has no regional wall motion abnormalities. The left ventricular internal cavity size was normal in size. There is severe left ventricular hypertrophy of the septal segment. Abnormal (paradoxical) septal motion, consistent with left bundle branch block. Left ventricular diastolic parameters are consistent with Grade I diastolic dysfunction (impaired relaxation). Right Ventricle: The right ventricular size is mildly enlarged. No increase in right ventricular wall thickness. Right ventricular systolic function is normal. There is normal pulmonary artery systolic pressure. The tricuspid regurgitant velocity is 2.37  m/s, and with  an assumed right atrial pressure of 3 mmHg, the estimated right ventricular systolic pressure is 25.5 mmHg. Left Atrium: Left atrial size was normal in size. Right Atrium: Right atrial size was normal in size. Pericardium: Trivial pericardial effusion is present. Presence of epicardial fat layer. Mitral Valve: The mitral valve is normal in structure. Mild mitral annular calcification. No evidence of mitral valve regurgitation. No evidence of mitral valve stenosis. Tricuspid Valve: The tricuspid valve is normal in structure. Tricuspid valve regurgitation is trivial. No evidence of tricuspid stenosis. Aortic  Valve: The aortic valve is tricuspid. There is mild calcification of the aortic valve. Aortic valve regurgitation is not visualized. No aortic stenosis is present. Aortic valve mean gradient measures 4.0 mmHg. Aortic valve peak gradient measures 6.2 mmHg. Aortic valve area, by VTI measures 3.18 cm. Pulmonic Valve: The pulmonic valve was normal in structure. Pulmonic valve regurgitation is trivial. No evidence of pulmonic stenosis. Aorta: The aortic root is normal in size and structure. Venous: The inferior vena cava is normal in size with greater than 50% respiratory variability, suggesting right atrial pressure of 3 mmHg. IAS/Shunts: The interatrial septum was not well visualized.  LEFT VENTRICLE PLAX 2D LVIDd:         3.40 cm   Diastology LVIDs:         1.90 cm   LV e' medial:    4.68 cm/s LV PW:         1.10 cm   LV E/e' medial:  13.7 LV IVS:        2.00 cm   LV e' lateral:   5.77 cm/s LVOT diam:     2.10 cm   LV E/e' lateral: 11.1 LV SV:         77 LV SV Index:   40 LVOT Area:     3.46 cm  RIGHT VENTRICLE RV Basal diam:  2.60 cm RV S prime:     12.50 cm/s TAPSE (M-mode): 2.0 cm LEFT ATRIUM           Index        RIGHT ATRIUM          Index LA diam:      3.30 cm 1.73 cm/m   RA Area:     9.17 cm LA Vol (A2C): 23.1 ml 12.08 ml/m  RA Volume:   14.20 ml 7.43 ml/m LA Vol (A4C): 23.1 ml 12.08 ml/m  AORTIC VALVE                    PULMONIC VALVE AV Area (Vmax):    2.96 cm     PV Vmax:       1.05 m/s AV Area (Vmean):   3.14 cm     PV Peak grad:  4.4 mmHg AV Area (VTI):     3.18 cm AV Vmax:           125.00 cm/s AV Vmean:          94.700 cm/s AV VTI:            0.243 m AV Peak Grad:      6.2 mmHg AV Mean Grad:      4.0 mmHg LVOT Vmax:         107.00 cm/s LVOT Vmean:        85.900 cm/s LVOT VTI:          0.223 m LVOT/AV VTI ratio: 0.92  AORTA Ao Root diam: 3.40 cm Ao Asc diam:  3.50  cm MITRAL VALVE               TRICUSPID VALVE MV Area (PHT): 2.84 cm    TR Peak grad:   22.5 mmHg MV Decel Time: 267 msec    TR  Vmax:        237.00 cm/s MV E velocity: 63.90 cm/s MV A velocity: 91.80 cm/s  SHUNTS MV E/A ratio:  0.70        Systemic VTI:  0.22 m                            Systemic Diam: 2.10 cm Weston Brass MD Electronically signed by Weston Brass MD Signature Date/Time: 09/04/2022/1:11:52 PM    Final    DG Abd 1 View  Result Date: 09/04/2022 CLINICAL DATA:  Provided history: Constipation. EXAM: ABDOMEN - 1 VIEW COMPARISON:  CT chest/abdomen/pelvis 09/03/2022. FINDINGS: Portions of the upper abdomen are excluded from the field of view. No dilated loops of small bowel are demonstrated within the imaged abdomen to suggest small bowel obstruction. Small to moderate stool burden, greatest within the right colon. No acute osseous abnormality identified. Lumbar spondylosis. Residual contrast within the urinary bladder. IMPRESSION: 1. Portions of the upper abdomen are excluded from the field of view. 2. No dilated loops of small bowel within the imaged abdomen to suggest small bowel obstruction. 3. Small-to-moderate stool burden, greatest within the right colon. Electronically Signed   By: Jackey Loge D.O.   On: 09/04/2022 08:28   CT Head Wo Contrast  Result Date: 09/03/2022 CLINICAL DATA:  Altered mental status EXAM: CT HEAD WITHOUT CONTRAST TECHNIQUE: Contiguous axial images were obtained from the base of the skull through the vertex without intravenous contrast. RADIATION DOSE REDUCTION: This exam was performed according to the departmental dose-optimization program which includes automated exposure control, adjustment of the mA and/or kV according to patient size and/or use of iterative reconstruction technique. COMPARISON:  02/07/2020 and 12/02/2020 FINDINGS: Brain: There is no mass, hemorrhage or extra-axial collection. There is generalized atrophy without lobar predilection. Hypodensity of the white matter is most commonly associated with chronic microvascular disease. Old right frontal infarct. Vascular: No  abnormal hyperdensity of the major intracranial arteries or dural venous sinuses. No intracranial atherosclerosis. Skull: The visualized skull base, calvarium and extracranial soft tissues are normal. Sinuses/Orbits: No fluid levels or advanced mucosal thickening of the visualized paranasal sinuses. No mastoid or middle ear effusion. The orbits are normal. IMPRESSION: 1. No acute intracranial abnormality. 2. 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 Electronically Signed   By: Deatra Robinson M.D.   On: 09/03/2022 19:39   CT CHEST ABDOMEN PELVIS W CONTRAST  Result Date: 09/03/2022 CLINICAL DATA:  Sepsis EXAM: CT CHEST, ABDOMEN, AND PELVIS WITH CONTRAST TECHNIQUE: Multidetector CT imaging of the chest, abdomen and pelvis was performed following the standard protocol during bolus administration of intravenous contrast. RADIATION DOSE REDUCTION: This exam was performed according to the departmental dose-optimization program which includes automated exposure control, adjustment of the mA and/or kV according to patient size and/or use of iterative reconstruction technique. CONTRAST:  75mL OMNIPAQUE IOHEXOL 350 MG/ML SOLN COMPARISON:  Chest radiograph 09/03/2022; CT abdomen and pelvis 03/21/2020 FINDINGS: CT CHEST FINDINGS Cardiovascular: Normal heart size. No pericardial effusion. Coronary artery and aortic atherosclerotic calcification. Mediastinum/Nodes: Unremarkable trachea and esophagus. No thoracic adenopathy. Lungs/Pleura: Elevated right hemidiaphragm. Bibasilar atelectasis. Pneumonia is difficult to exclude. Multiple small nodules are present bilaterally. For  example 3 mm nodule in the right middle lobe on 5/61, 4 mm nodule in the right upper lobe on 05/30. Musculoskeletal: No acute fracture or destructive osseous lesion. CT ABDOMEN PELVIS FINDINGS Hepatobiliary: Unremarkable liver. Wall calcifications in the gallbladder fundus. Distention of the gallbladder unchanged from prior.  No wall thickening or pericholecystic fluid. No biliary dilation. Pancreas: Unremarkable. Spleen: Unremarkable. Adrenals/Urinary Tract: Normal adrenal glands. No urinary calculi or hydronephrosis. Unremarkable bladder. Stomach/Bowel: Normal caliber large and small bowel. No bowel wall thickening. Stool ball in the rectum. Normal appendix. Stomach is within normal limits. Vascular/Lymphatic: Aortic atherosclerosis. 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 No enlarged abdominal or pelvic lymph nodes. Reproductive: No acute abnormality. Other: No free intraperitoneal fluid or air. Pelvic floor laxity with rectocele. Musculoskeletal: No acute fracture or destructive osseous lesion. IMPRESSION: 1. Bibasilar atelectasis. Pneumonia is difficult to exclude. 2. Multiple small pulmonary nodules measuring up to 4 mm. No follow-up needed if patient is low-risk (and has no known or suspected primary neoplasm). Non-contrast chest CT can be considered in 12 months if patient is high-risk. This recommendation follows the consensus statement: Guidelines for Management of Incidental Pulmonary Nodules Detected on CT Images: From the Fleischner Society 2017; Radiology 2017; 284:228-243. 3. No acute abnormality in the abdomen or pelvis. Aortic Atherosclerosis (ICD10-I70.0). Electronically Signed   By: Minerva Fester M.D.   On: 09/03/2022 19:35   DG Chest Port 1 View  Result Date: 09/03/2022 CLINICAL DATA:  Weakness. EXAM: PORTABLE CHEST 1 VIEW COMPARISON:  Chest radiograph dated 08/26/2022 head FINDINGS: There is shallow inspiration with bibasilar atelectasis. Developing infiltrate at the left lung base is not excluded clinical correlation is recommended. No pleural effusion or pneumothorax. The cardiac silhouette is within normal limits. Atherosclerotic calcification of the aortic arch. No acute osseous pathology. IMPRESSION: Left lung base atelectasis or developing infiltrate. Electronically Signed   By: Elgie Collard  M.D.   On: 09/03/2022 17:12        Scheduled Meds:  aspirin  325 mg Oral Daily   bisacodyl  10 mg Rectal Once   enoxaparin (LOVENOX) injection  40 mg Subcutaneous Q24H   insulin aspart  0-9 Units Subcutaneous Q4H   milk and molasses  1 enema Rectal Once   rosuvastatin  10 mg Oral Daily   Continuous Infusions:  ampicillin-sulbactam (UNASYN) IV 3 g (09/05/22 0700)   azithromycin Stopped (09/04/22 2039)     LOS: 1 day    Time spent: 35 minutes    Analleli Gierke Hoover Brunette, DO [facility]  If 7PM-7AM, please contact night-coverage [email or web address] 09/05/2022, 9:52 AM

## 2022-09-06 DIAGNOSIS — J690 Pneumonitis due to inhalation of food and vomit: Secondary | ICD-10-CM | POA: Diagnosis not present

## 2022-09-06 LAB — BASIC METABOLIC PANEL
Anion gap: 11 (ref 5–15)
BUN: 16 mg/dL (ref 8–23)
CO2: 25 mmol/L (ref 22–32)
Calcium: 8.3 mg/dL — ABNORMAL LOW (ref 8.9–10.3)
Chloride: 105 mmol/L (ref 98–111)
Creatinine, Ser: 0.93 mg/dL (ref 0.44–1.00)
GFR, Estimated: >60 mL/min (ref >60)
Glucose, Bld: 141 mg/dL — ABNORMAL HIGH (ref 70–99)
Potassium: 3.6 mmol/L (ref 3.5–5.1)
Sodium: 141 mmol/L (ref 135–145)

## 2022-09-06 LAB — CBC
HCT: 30.3 % — ABNORMAL LOW (ref 36.0–46.0)
HCT: 32.7 % — ABNORMAL LOW (ref 36.0–46.0)
Hemoglobin: 9.7 g/dL — ABNORMAL LOW (ref 12.0–15.0)
Hemoglobin: 10.1 g/dL — ABNORMAL LOW (ref 12.0–15.0)
MCH: 29 pg (ref 26.0–34.0)
MCH: 28.3 pg (ref 26.0–34.0)
MCHC: 32 g/dL (ref 30.0–36.0)
MCHC: 30.9 g/dL (ref 30.0–36.0)
MCV: 90.4 fL (ref 80.0–100.0)
MCV: 91.6 fL (ref 80.0–100.0)
Platelets: 211 10*3/uL (ref 150–400)
Platelets: 220 10*3/uL (ref 150–400)
RBC: 3.35 MIL/uL — ABNORMAL LOW (ref 3.87–5.11)
RBC: 3.57 MIL/uL — ABNORMAL LOW (ref 3.87–5.11)
RDW: 15.5 % (ref 11.5–15.5)
RDW: 15.5 % (ref 11.5–15.5)
WBC: 7.4 10*3/uL (ref 4.0–10.5)
WBC: 9 10*3/uL (ref 4.0–10.5)
nRBC: 0 % (ref 0.0–0.2)
nRBC: 0 % (ref 0.0–0.2)

## 2022-09-06 LAB — MAGNESIUM
Magnesium: 1.9 mg/dL (ref 1.7–2.4)
Magnesium: 1.8 mg/dL (ref 1.7–2.4)

## 2022-09-06 LAB — COMPREHENSIVE METABOLIC PANEL
ALT: 28 U/L (ref 0–44)
AST: 28 U/L (ref 15–41)
Albumin: 2.7 g/dL — ABNORMAL LOW (ref 3.5–5.0)
Alkaline Phosphatase: 53 U/L (ref 38–126)
Anion gap: 9 (ref 5–15)
BUN: 13 mg/dL (ref 8–23)
CO2: 25 mmol/L (ref 22–32)
Calcium: 8.2 mg/dL — ABNORMAL LOW (ref 8.9–10.3)
Chloride: 106 mmol/L (ref 98–111)
Creatinine, Ser: 0.96 mg/dL (ref 0.44–1.00)
GFR, Estimated: >60 mL/min (ref >60)
Glucose, Bld: 213 mg/dL — ABNORMAL HIGH (ref 70–99)
Potassium: 3.6 mmol/L (ref 3.5–5.1)
Sodium: 140 mmol/L (ref 135–145)
Total Bilirubin: 0.6 mg/dL (ref 0.3–1.2)
Total Protein: 6 g/dL — ABNORMAL LOW (ref 6.5–8.1)

## 2022-09-06 LAB — CULTURE, BLOOD (ROUTINE X 2): Culture: NO GROWTH

## 2022-09-06 LAB — PHOSPHORUS: Phosphorus: 2.3 mg/dL — ABNORMAL LOW (ref 2.5–4.6)

## 2022-09-06 LAB — GLUCOSE, CAPILLARY
Glucose-Capillary: 106 mg/dL — ABNORMAL HIGH (ref 70–99)
Glucose-Capillary: 121 mg/dL — ABNORMAL HIGH (ref 70–99)
Glucose-Capillary: 134 mg/dL — ABNORMAL HIGH (ref 70–99)
Glucose-Capillary: 182 mg/dL — ABNORMAL HIGH (ref 70–99)
Glucose-Capillary: 183 mg/dL — ABNORMAL HIGH (ref 70–99)
Glucose-Capillary: 296 mg/dL — ABNORMAL HIGH (ref 70–99)

## 2022-09-06 MED ORDER — ONDANSETRON HCL 4 MG PO TABS: 4.0000 mg | ORAL_TABLET | Freq: Four times a day (QID) | ORAL | Status: DC | PRN

## 2022-09-06 MED ORDER — ASPIRIN 81 MG PO CHEW
81.0000 mg | CHEWABLE_TABLET | Freq: Every day | ORAL | Status: DC
  Administered 2022-09-06 – 2022-09-09 (×4): 81 mg via ORAL
  Filled 2022-09-06 (×4): qty 1

## 2022-09-06 MED ORDER — GUAIFENESIN ER 600 MG PO TB12
600.0000 mg | ORAL_TABLET | Freq: Two times a day (BID) | ORAL | Status: DC
  Administered 2022-09-06 – 2022-09-09 (×7): 600 mg via ORAL
  Filled 2022-09-06 (×7): qty 1

## 2022-09-06 MED ORDER — ONDANSETRON HCL 4 MG/2ML IJ SOLN
4.0000 mg | Freq: Four times a day (QID) | INTRAMUSCULAR | Status: DC | PRN
  Filled 2022-09-06: qty 2

## 2022-09-06 MED ORDER — ORAL CARE MOUTH RINSE: 15.0000 mL | OROMUCOSAL | Status: DC | PRN

## 2022-09-06 MED ORDER — MEMANTINE HCL 10 MG PO TABS
10.0000 mg | ORAL_TABLET | Freq: Two times a day (BID) | ORAL | Status: DC
  Administered 2022-09-06 – 2022-09-09 (×7): 10 mg via ORAL
  Filled 2022-09-06 (×7): qty 1

## 2022-09-06 MED ORDER — ORAL CARE MOUTH RINSE
15.0000 mL | OROMUCOSAL | Status: DC
  Administered 2022-09-06 – 2022-09-09 (×11): 15 mL via OROMUCOSAL

## 2022-09-06 MED ORDER — METOPROLOL TARTRATE 12.5 MG HALF TABLET
12.5000 mg | ORAL_TABLET | Freq: Two times a day (BID) | ORAL | Status: DC
  Administered 2022-09-06 – 2022-09-09 (×7): 12.5 mg via ORAL
  Filled 2022-09-06 (×7): qty 1

## 2022-09-06 MED ORDER — HYDROCODONE-ACETAMINOPHEN 5-325 MG PO TABS: 1.0000 | ORAL_TABLET | ORAL | Status: DC | PRN

## 2022-09-06 MED ORDER — LACTATED RINGERS IV SOLN: INTRAVENOUS | Status: AC

## 2022-09-06 MED ORDER — POTASSIUM PHOSPHATES 15 MMOLE/5ML IV SOLN
15.0000 mmol | Freq: Once | INTRAVENOUS | Status: AC
  Administered 2022-09-06: 15 mmol via INTRAVENOUS
  Filled 2022-09-06: qty 5

## 2022-09-06 NOTE — Progress Notes (Signed)
MEWS Progress Note  Patient Details Name: Caroline White MRN: [id number] DOB: [birth year] Today's Date: 09/06/2022   MEWS Flowsheet Documentation:  Assess: MEWS Score Temp: 98.3 F (36.8 C) BP: (!) 157/73 MAP (mmHg): 94 Pulse Rate: (!) 103 ECG Heart Rate: (!) 105 Resp: (!) 32 Level of Consciousness: Alert SpO2: 93 % O2 Device: Room Air Patient Activity (if Appropriate): In chair O2 Flow Rate (L/min): 1 L/min Assess: MEWS Score MEWS Temp: 0 MEWS Systolic: 0 MEWS Pulse: 1 MEWS RR: 2 MEWS LOC: 0 MEWS Score: 3 MEWS Score Color: Yellow Assess: SIRS CRITERIA SIRS Temperature : 0 SIRS Respirations : 1 SIRS Pulse: 1 SIRS WBC: 0 SIRS Score Sum : 2 SIRS Temperature : 0 SIRS Pulse: 1 SIRS Respirations : 1 SIRS WBC: 0 SIRS Score Sum : 2 Assess: if the MEWS score is Yellow or Red Were vital signs taken at a resting state?: Yes Focused Assessment: No change from prior assessment Does the patient meet 2 or more of the SIRS criteria?: Yes Does the patient have a confirmed or suspected source of infection?: Yes MEWS guidelines implemented : Yes, yellow Treat MEWS Interventions: Considered administering scheduled or prn medications/treatments as ordered Take Vital Signs Increase Vital Sign Frequency : Yellow: Q2hr x1, continue Q4hrs until patient remains green for 12hrs Escalate MEWS: Escalate: Yellow: Discuss with charge nurse and consider notifying provider and/or RRT Provider Notification Provider Name/Title: MD Margo Aye Date Provider Notified: 09/06/22 Time Provider Notified: 1316 Method of Notification:  (secure chat) Notification Reason: Other (Comment) (Elevated SBP and Heart rate.) Test performed and critical result: troponin 1305 Date Critical Result Received: 09/04/22 Time Critical Result Received: 0420 Provider response: Evaluate remotely Date of Provider Response: 09/04/22 Time of Provider Response: 0426      Kavin Leech A 09/06/2022, 1:17  PM

## 2022-09-06 NOTE — Progress Notes (Signed)
Brief Palliative Medicine Progress Note:  Called patient's daughter in law/Susan to confirm meeting for 2p today.  She explains that patient's son is not yet back from his trip and they will not be able to meet today. Another tentative GOC meeting has been scheduled for tomorrow 6/24 at 10:30am. She will call PMT phone tomorrow morning to confirm/adjust meeting time.  Thank you for allowing PMT to assist in the care of this patient.  Rhia Blatchford M. Katrinka Blazing [facility] Palliative Medicine Team Team Phone: [phone] NO CHARGE

## 2022-09-06 NOTE — Progress Notes (Addendum)
PROGRESS NOTE    Caroline White  ZOX:[id number] DOB: 1950-07-13 DOA: 09/03/2022 PCP: Blair Heys, MD   Brief Narrative:    Caroline White is a 72 y.o. female with medical history significant of vascular Dementia, HTN, HLD, CVA, DM2.  She presented with debility and has been getting progressively weaker over the last 1 week.  She was admitted for acute hypoxemic respiratory failure secondary to aspiration pneumonia and was also noted to have elevated troponin for which she was started on heparin drip.  She was seen by cardiology 09/04/22 and heparin drip was discontinued as this appears to be a type II MI.  She is high risk for morbidity and mortality and continues to remain on antibiotics as prescribed.  Palliative care team was consulted and is following.  Tentative family meeting scheduled for 09/06/2022 at 2 PM for full goals of care discussion with palliative care.  09/06/2022: The patient was seen and examined at bedside.  There were no acute events overnight.  She has no new complaints today.  Assessment & Plan:   Principal Problem:   Aspiration pneumonia (HCC) Active Problems:   Type 2 diabetes mellitus with microalbuminuria, without long-term current use of insulin (HCC)   Acute respiratory failure with hypoxia (HCC)   Dementia without behavioral disturbance (HCC)   Constipation   History of CVA (cerebrovascular accident)  Assessment and Plan:  Acute hypoxemic respiratory failure secondary to aspiration pneumonia -Continue Unasyn and azithromycin Blood cultures x 2, no growth to date. Wean off oxygen supplementation as tolerated.  Elevated troponin from type II MI -No further evaluation recommended per cardiology -Heparin drip discontinued Denies any anginal symptoms at the time of the visit.  Type 2 diabetes, well-controlled. -Continue SSI as needed -A1c 5.8% -TSH 4.5, free T4 1.12  Dementia without behavioral disturbance -Progressive end-stage  with debility -Appreciate palliative evaluation -PT/OT/SLP evaluation pending Continue dysphagia 1 diet for now Continue aspiration precautions.  History of CVA -Continue Crestor -CT head with no acute findings  Essential hypertension Sinus tachycardia Home Toprol-XL on hold Start p.o. Lopressor 12.5 mg twice daily Closely monitor vital signs and maintain MAP greater than 65   DVT prophylaxis: Subcu Lovenox daily. Code Status: DNR Family Communication: None at bedside. Disposition Plan:  Status is: Inpatient Remains inpatient appropriate because: Need for ongoing IV medications.   Time: 50 minutes.    Consultants:  Cardiology Palliative care  Procedures:  None  Antimicrobials:  Anti-infectives (From admission, onward)    Start     Dose/Rate Route Frequency Ordered Stop   09/04/22 1900  azithromycin (ZITHROMAX) 500 mg in sodium chloride 0.9 % 250 mL IVPB        500 mg 250 mL/hr over 60 Minutes Intravenous Every 24 hours 09/03/22 2229     09/04/22 1900  cefTRIAXone (ROCEPHIN) 1 g in sodium chloride 0.9 % 100 mL IVPB  Status:  Discontinued        1 g 200 mL/hr over 30 Minutes Intravenous Every 24 hours 09/03/22 2229 09/03/22 2301   09/04/22 0600  Ampicillin-Sulbactam (UNASYN) 3 g in sodium chloride 0.9 % 100 mL IVPB        3 g 200 mL/hr over 30 Minutes Intravenous Every 6 hours 09/03/22 2303     09/03/22 1845  cefTRIAXone (ROCEPHIN) 1 g in sodium chloride 0.9 % 100 mL IVPB        1 g 200 mL/hr over 30 Minutes Intravenous  Once 09/03/22 1836 09/03/22 2206   09/03/22 1845  azithromycin (ZITHROMAX) 500 mg in sodium chloride 0.9 % 250 mL IVPB        500 mg 250 mL/hr over 60 Minutes Intravenous  Once 09/03/22 1836 09/03/22 2206        Objective: Vitals:   09/06/22 0727 09/06/22 0800 09/06/22 0900 09/06/22 0925  BP: (!) 154/68     Pulse: 86     Resp: 20     Temp: 97.7 F (36.5 C)     TempSrc: Oral     SpO2: 96% 100% 93% 94%  Weight:      Height:         Intake/Output Summary (Last 24 hours) at 09/06/2022 1035 Last data filed at 09/06/2022 0845 Gross per 24 hour  Intake 912.71 ml  Output 600 ml  Net 312.71 ml   Filed Weights   09/03/22 1644 09/05/22 0413 09/06/22 0321  Weight: 81.6 kg 90.4 kg 92.8 kg    Examination:  General exam: Frail-appearing no acute distress.  She is alert and interactive. Respiratory system: Clear to oscitation no wheezes or rales. Cardiovascular system: Regular rate and rhythm no rubs or gallops. Gastrointestinal system: Soft nontender normal bowel sounds present. Central nervous system: Awake and alert. Extremities: No lower extremity edema bilaterally. Skin: No significant lesions noted. Psychiatry: Mood is appropriate for condition setting.    Data Reviewed: I have personally reviewed following labs and imaging studies  CBC: Recent Labs  Lab 09/03/22 1720 09/04/22 0106 09/06/22 0112  WBC 10.4  --  7.4  NEUTROABS 8.0*  --   --   HGB 11.0* 10.9* 9.7*  HCT 35.3* 32.0* 30.3*  MCV 91.9  --  90.4  PLT 287  --  211   Basic Metabolic Panel: Recent Labs  Lab 09/03/22 1720 09/04/22 0056 09/04/22 0106 09/05/22 0116 09/06/22 0112  NA 138  --  140 138 141  K 3.9  --  4.4 3.5 3.6  CL 99  --   --  107 105  CO2 27  --   --  22 25  GLUCOSE 154*  --   --  130* 141*  BUN 17  --   --  14 16  CREATININE 0.95  --   --  0.80 0.93  CALCIUM 8.8*  --   --  7.9* 8.3*  MG 1.9  --   --  1.9 1.9  PHOS  --  3.2  --   --   --    GFR: Estimated Creatinine Clearance: 63.7 mL/min (by C-G formula based on SCr of 0.93 mg/dL). Liver Function Tests: Recent Labs  Lab 09/03/22 1720  AST 26  ALT 27  ALKPHOS 57  BILITOT 0.8  PROT 6.5  ALBUMIN 3.0*   No results for input(s): "LIPASE", "AMYLASE" in the last 168 hours. Recent Labs  Lab 09/03/22 1645  AMMONIA <10   Coagulation Profile: Recent Labs  Lab 09/03/22 2345  INR 1.1   Cardiac Enzymes: Recent Labs  Lab 09/04/22 0056  CKTOTAL 493*    BNP (last 3 results) No results for input(s): "PROBNP" in the last 8760 hours. HbA1C: Recent Labs    09/04/22 0056  HGBA1C 5.8*   CBG: Recent Labs  Lab 09/05/22 1636 09/05/22 2059 09/06/22 0054 09/06/22 0537 09/06/22 0729  GLUCAP 197* 162* 134* 121* 106*   Lipid Profile: Recent Labs    09/04/22 0310  CHOL 95  HDL 27*  LDLCALC 48  TRIG 130  CHOLHDL 3.5   Thyroid Function Tests: Recent Labs  09/04/22 0056 09/05/22 0738  TSH 4.598*  --   FREET4  --  1.12   Anemia Panel: Recent Labs    09/05/22 0116  VITAMINB12 285   Sepsis Labs: Recent Labs  Lab 09/03/22 1645 09/04/22 0056  PROCALCITON  --  0.10  LATICACIDVEN 1.2 1.2    Recent Results (from the past 240 hour(s))  Culture, blood (routine x 2)     Status: None (Preliminary result)   Collection Time: 09/03/22  4:45 PM   Specimen: BLOOD LEFT ARM  Result Value Ref Range Status   Specimen Description BLOOD LEFT ARM  Final   Special Requests   Final    BOTTLES DRAWN AEROBIC AND ANAEROBIC Blood Culture adequate volume   Culture   Final    NO GROWTH 3 DAYS Performed at [facility], [address] [place], [place] [address]    Report Status PENDING  Incomplete  SARS Coronavirus 2 by RT PCR (hospital order, performed in [facility] hospital lab) *cepheid single result test* Anterior Nasal Swab     Status: None   Collection Time: 09/03/22  5:50 PM   Specimen: Anterior Nasal Swab  Result Value Ref Range Status   SARS Coronavirus 2 by RT PCR NEGATIVE NEGATIVE Final    Comment: Performed at [facility], [address], [place], [place] [address]  Culture, blood (routine x 2)     Status: None (Preliminary result)   Collection Time: 09/04/22 12:56 AM   Specimen: BLOOD LEFT ARM  Result Value Ref Range Status   Specimen Description BLOOD LEFT ARM  Final   Special Requests   Final    BOTTLES DRAWN AEROBIC AND ANAEROBIC Blood Culture results may not be optimal due to an excessive volume of  blood received in culture bottles   Culture   Final    NO GROWTH 2 DAYS Performed at [facility], [address] [place], [place] [address]    Report Status PENDING  Incomplete         Radiology Studies: VAS [place] ABI WITH/WO TBI  Result Date: 09/04/2022  LOWER EXTREMITY DOPPLER STUDY Patient Name:  Caroline White  Date of Exam:   09/04/2022 Medical Rec #: [id number]               Accession #:    [id number] Date of Birth: 06/18/1950              Patient Gender: F Patient Age:   11 years Exam Location:  [facility] Procedure:      VAS [place] ABI WITH/WO TBI Referring Phys: Jonny Ruiz DOUTOVA --------------------------------------------------------------------------------  Indications: Claudication. High Risk Factors: Hypertension, hyperlipidemia, Diabetes, prior CVA.  Comparison Study: No previous study. Performing Technologist: McKayla Maag RVT, [place]  Examination Guidelines: A complete evaluation includes at minimum, Doppler waveform signals and systolic blood pressure reading at the level of bilateral brachial, anterior tibial, and posterior tibial arteries, when vessel segments are accessible. Bilateral testing is considered an integral part of a complete examination. Photoelectric Plethysmograph (PPG) waveforms and toe systolic pressure readings are included as required and additional duplex testing as needed. Limited examinations for reoccurring indications may be performed as noted.  ABI Findings: +---------+------------------+-----+---------+---------------------------------+ Right    Rt Pressure (mmHg)IndexWaveform Comment                           +---------+------------------+-----+---------+---------------------------------+ Brachial  triphasicNo pressure obtained due to IV                                             placement                         +---------+------------------+-----+---------+---------------------------------+ PTA       158               1.18 triphasic                                  +---------+------------------+-----+---------+---------------------------------+ DP       150               1.12 triphasic                                  +---------+------------------+-----+---------+---------------------------------+ Great Toe118               0.88 Normal                                     +---------+------------------+-----+---------+---------------------------------+ +---------+------------------+-----+---------+-------+ Left     Lt Pressure (mmHg)IndexWaveform Comment +---------+------------------+-----+---------+-------+ Brachial 134                    triphasic        +---------+------------------+-----+---------+-------+ PTA      156               1.16 triphasic        +---------+------------------+-----+---------+-------+ DP       171               1.28 triphasic        +---------+------------------+-----+---------+-------+ Great Toe104               0.78 Normal           +---------+------------------+-----+---------+-------+ +-------+-----------+-----------+------------+------------+ ABI/TBIToday's ABIToday's TBIPrevious ABIPrevious TBI +-------+-----------+-----------+------------+------------+ Right  1.18       0.88                                +-------+-----------+-----------+------------+------------+ Left   1.28       0.78                                +-------+-----------+-----------+------------+------------+  *See table(s) above for measurements and observations.  Electronically signed by Waverly Ferrari MD on 09/04/2022 at 5:07:24 PM.    Final         Scheduled Meds:  aspirin  81 mg Oral Daily   aspirin  325 mg Oral Daily   bisacodyl  10 mg Rectal Once   enoxaparin (LOVENOX) injection  40 mg Subcutaneous Q24H   guaiFENesin  600 mg Oral BID   insulin aspart  0-9 Units Subcutaneous Q4H   memantine  10 mg Oral BID   milk and molasses   1 enema Rectal Once   rosuvastatin  10 mg Oral Daily   Continuous Infusions:  ampicillin-sulbactam (UNASYN) IV 3 g (09/06/22 0543)   azithromycin Stopped (09/05/22  2100)     LOS: 2 days    Time spent: 35 minutes    Darlin Drop, DO [facility]  If 7PM-7AM, please contact night-coverage [email or web address] 09/06/2022, 10:35 AM

## 2022-09-07 DIAGNOSIS — J9601 Acute respiratory failure with hypoxia: Secondary | ICD-10-CM | POA: Diagnosis not present

## 2022-09-07 DIAGNOSIS — J690 Pneumonitis due to inhalation of food and vomit: Secondary | ICD-10-CM | POA: Diagnosis not present

## 2022-09-07 DIAGNOSIS — F0390 Unspecified dementia without behavioral disturbance: Secondary | ICD-10-CM | POA: Diagnosis not present

## 2022-09-07 DIAGNOSIS — Z7189 Other specified counseling: Secondary | ICD-10-CM | POA: Diagnosis not present

## 2022-09-07 LAB — LEGIONELLA PNEUMOPHILA SEROGP 1 UR AG: L. pneumophila Serogp 1 Ur Ag: NEGATIVE

## 2022-09-07 LAB — GLUCOSE, CAPILLARY
Glucose-Capillary: 130 mg/dL — ABNORMAL HIGH (ref 70–99)
Glucose-Capillary: 132 mg/dL — ABNORMAL HIGH (ref 70–99)
Glucose-Capillary: 142 mg/dL — ABNORMAL HIGH (ref 70–99)
Glucose-Capillary: 160 mg/dL — ABNORMAL HIGH (ref 70–99)
Glucose-Capillary: 181 mg/dL — ABNORMAL HIGH (ref 70–99)
Glucose-Capillary: 254 mg/dL — ABNORMAL HIGH (ref 70–99)

## 2022-09-07 LAB — CULTURE, BLOOD (ROUTINE X 2)

## 2022-09-07 NOTE — Evaluation (Signed)
Occupational Therapy Evaluation Patient Details Name: Caroline White MRN: [id number] DOB: 1950-07-23 Today's Date: 09/07/2022   History of Present Illness 72 y.o. female admitted for aspiration PNA and debility. PMH: vascular Dementia, HTN, HLD, CVA, DM2. Recent admit last weak for UTI, was ambulating indep up until 2 weeks ago and she hasn't been out of bed   Clinical Impression   Pt questionable historian, per chart review was ind a few weeks ago, but has since been bedbound needing assist for ADLs. Pt currently needing min-total A for ADLs, max A +2 for bed mobility, pt with posterior and L lateral lean, unable to independently support self sitting EOB. Further mobility deferred. Pt presenting with impairments listed below, will follow acutely. Patient will benefit from continued inpatient follow up therapy, <3 hours/day to maximize safety/ind with ADLs/functional mobility.      Recommendations for follow up therapy are one component of a multi-disciplinary discharge planning process, led by the attending physician.  Recommendations may be updated based on patient status, additional functional criteria and insurance authorization.   Assistance Recommended at Discharge Frequent or constant Supervision/Assistance  Patient can return home with the following Two people to help with walking and/or transfers;A lot of help with bathing/dressing/bathroom;Assistance with cooking/housework;Direct supervision/assist for medications management;Direct supervision/assist for financial management;Help with stairs or ramp for entrance;Assist for transportation    Functional Status Assessment  Patient has had a recent decline in their functional status and demonstrates the ability to make significant improvements in function in a reasonable and predictable amount of time.  Equipment Recommendations  Other (comment) (defer)    Recommendations for Other Services PT consult     Precautions /  Restrictions Precautions Precautions: Fall Precaution Comments: expressive difficulties Restrictions Weight Bearing Restrictions: No      Mobility Bed Mobility Overal bed mobility: Needs Assistance Bed Mobility: Supine to Sit     Supine to sit: Max assist, +2 for physical assistance, HOB elevated     General bed mobility comments: HOB elevated to ~50*, posterior and L lateral lean in sitting    Transfers                   General transfer comment: deferred      Balance Overall balance assessment: Needs assistance Sitting-balance support: Feet supported, No upper extremity supported Sitting balance-Leahy Scale: Poor     Standing balance support: Bilateral upper extremity supported Standing balance-Leahy Scale: Zero Standing balance comment: dependent on external support                           ADL either performed or assessed with clinical judgement   ADL Overall ADL's : Needs assistance/impaired Eating/Feeding: Minimal assistance;Sitting;Bed level   Grooming: Minimal assistance;Bed level   Upper Body Bathing: Maximal assistance;Bed level   Lower Body Bathing: Maximal assistance;Bed level   Upper Body Dressing : Maximal assistance;Bed level   Lower Body Dressing: Bed level;Total assistance   Toilet Transfer: Maximal assistance;+2 for physical assistance   Toileting- Clothing Manipulation and Hygiene: Total assistance;Bed level Toileting - Clothing Manipulation Details (indicate cue type and reason): pericare in sidelying     Functional mobility during ADLs: Maximal assistance;+2 for physical assistance       Vision   Vision Assessment?: No apparent visual deficits     Perception Perception Perception Tested?: No   Praxis Praxis Praxis tested?: Not tested    Pertinent Vitals/Pain Pain Assessment Pain Assessment: No/denies pain  Hand Dominance Right   Extremity/Trunk Assessment Upper Extremity Assessment Upper  Extremity Assessment: Generalized weakness (globally weak  R > LUE weakness, shoulder flexion WFL and grasp WFL for BADL)   Lower Extremity Assessment Lower Extremity Assessment: Defer to PT evaluation   Cervical / Trunk Assessment Cervical / Trunk Assessment: Kyphotic   Communication Communication Communication: Expressive difficulties   Cognition Arousal/Alertness: Awake/alert Behavior During Therapy: Flat affect Overall Cognitive Status: History of cognitive impairments - at baseline                                 General Comments: pt with vascular dementia, able to state name and date of birth with increased cues     General Comments  VSS on RA    Exercises     Shoulder Instructions      Home Living Family/patient expects to be discharged to:: Private residence Living Arrangements: Spouse/significant other Available Help at Discharge: Family;Available PRN/intermittently Type of Home: House Home Access: Stairs to enter [employer] of Steps: 3 Entrance Stairs-Rails: Left Home Layout: One level     Bathroom Shower/Tub: Walk-in shower                    Prior Functioning/Environment Prior Level of Function : Needs assist             Mobility Comments: per chart pt was indep with amb until 2 weeks ago, for the last 2 weeks pt has been bed bound ADLs Comments: needs assist as of last 2 weeks        OT Problem List: Decreased strength;Decreased range of motion;Decreased activity tolerance;Impaired balance (sitting and/or standing);Decreased cognition;Decreased safety awareness;Cardiopulmonary status limiting activity;Impaired UE functional use      OT Treatment/Interventions: Self-care/ADL training;Therapeutic exercise;Energy conservation;DME and/or AE instruction;Therapeutic activities;Patient/family education;Balance training;Cognitive remediation/compensation    OT Goals(Current goals can be found in the care plan section)  Acute Rehab OT Goals Patient Stated Goal: none stated OT Goal Formulation: With patient Time For Goal Achievement: 09/21/22 Potential to Achieve Goals: Good ADL Goals Pt Will Perform Grooming: with min assist;sitting Pt Will Transfer to Toilet: with mod assist;squat pivot transfer;stand pivot transfer;bedside commode Additional ADL Goal #1: pt will complete bed mobility mod A in prep for ADLs  OT Frequency: Min 1X/week    Co-evaluation              AM-PAC OT "6 Clicks" Daily Activity     Outcome Measure Help from another person eating meals?: A Little Help from another person taking care of personal grooming?: A Little Help from another person toileting, which includes using toliet, bedpan, or urinal?: Total Help from another person bathing (including washing, rinsing, drying)?: A Lot Help from another person to put on and taking off regular upper body clothing?: A Lot Help from another person to put on and taking off regular lower body clothing?: Total 6 Click Score: 12   End of Session Nurse Communication: Mobility status (checked with MD and palliative to confirm can see pt)  Activity Tolerance: Patient tolerated treatment well Patient left: in bed;with call bell/phone within reach;with bed alarm set;with SCD's reapplied  OT Visit Diagnosis: Unsteadiness on feet (R26.81);Other abnormalities of gait and mobility (R26.89);Muscle weakness (generalized) (M62.81);History of falling (Z91.81)                Time: 1610-9604 OT Time Calculation (min): 20 min Charges:  OT General Charges $OT  Visit: 1 Visit OT Evaluation $OT Eval Moderate Complexity: 1 Mod  Adryen Cookson K, OTD, OTR/L SecureChat Preferred Acute Rehab [phone]  Carver Fila Koonce 09/07/2022, 2:25 PM

## 2022-09-07 NOTE — TOC Progression Note (Signed)
Transition of Care [facility]) - Progression Note    Patient Details  Name: Caroline White MRN: [id number] Date of Birth: 1950/04/20  [facility]) CM/SW Contact  Leone Haven, RN Phone Number: 09/07/2022, 4:34 PM  Clinical Narrative:    NCM received notification that this patient will need home hospice, NCM spoke with son , Greig Castilla, he states he would like [facility]  [phone], a hospital bed and bedside table, she will need ambulance transport home.  NCM made referral to [facility] with [facility], she will contact Greig Castilla .  Drenda Freeze will let this NCM know when DME has been delivered to the home.    Expected Discharge Plan: Skilled Nursing Facility Barriers to Discharge: Continued Medical Work up  Expected Discharge Plan and Services     Post Acute Care Choice: Skilled Nursing Facility Living arrangements for the past 2 months: Single Family Home                                       Social Determinants of Health (SDOH) Interventions SDOH Screenings   Food Insecurity: No Food Insecurity (09/04/2022)  Housing: Patient Declined (09/04/2022)  Transportation Needs: No Transportation Needs (09/04/2022)  Utilities: Not At Risk (09/04/2022)  Tobacco Use: Low Risk  (09/03/2022)    Readmission Risk Interventions     No data to display

## 2022-09-07 NOTE — Progress Notes (Signed)
PROGRESS NOTE    Caroline White  WUX:[id number] DOB: [birth year] DOA: 09/03/2022 PCP: Blair Heys, MD   Brief Narrative:    Caroline White is a 72 y.o. female with medical history significant of vascular Dementia, HTN, HLD, CVA, DM2.  She presented with debility and has been getting progressively weaker over the last 1 week.  She was admitted for acute hypoxemic respiratory failure secondary to aspiration pneumonia and was also noted to have elevated troponin for which she was started on heparin drip.  She was seen by cardiology 09/04/22 and heparin drip was discontinued as this appears to be a type II MI.  She is high risk for morbidity and mortality and continues to remain on antibiotics as prescribed IV Unasyn and IV azithromycin since 09/04/2022.  Palliative care team was consulted and is following.  Tentative family meeting scheduled for 09/06/2022 at 2 PM for full goals of care discussion with palliative care.  The patient's son was unable to be present for this meeting.  The meeting was rescheduled for 09/07/2022.  09/07/2022: The patient was seen and examined at her bedside.  There were no acute events overnight.  The patient is alert and minimally interactive.   Assessment & Plan:   Principal Problem:   Aspiration pneumonia (HCC) Active Problems:   Type 2 diabetes mellitus with microalbuminuria, without long-term current use of insulin (HCC)   Acute respiratory failure with hypoxia (HCC)   Dementia without behavioral disturbance (HCC)   Constipation   History of CVA (cerebrovascular accident)  Assessment and Plan:  Resolved acute hypoxemic respiratory failure secondary to aspiration pneumonia Not on oxygen supplementation at baseline No longer requiring oxygen supplementation. Currently on Unasyn and IV azithromycin day number 4 out of 5 Blood cultures x 2, no growth to date. Wean off oxygen supplementation as tolerated.  Left lower lobe pneumonia with  concern for aspiration pneumonia Personally reviewed chest x-ray done on 09/03/2022 which shows developing left lower lobe infiltrates. Completed 5 days of antibiotics. Continue aspiration precautions. Currently on dysphagia 3 diet with nectar thick liquids  Elevated troponin from type II MI -No further evaluation recommended per cardiology -Heparin drip discontinued No anginal symptoms reported.  Hypophosphatemia Phosphorus 2.3 Repleted intravenously.  Hypoalbuminemia Albumin 2.7 Encourage oral protein calorie intake as tolerated  Type 2 diabetes, well-controlled. -Continue SSI as needed -A1c 5.8% -TSH 4.5, free T4 1.12  Dementia without behavioral disturbance -Progressive end-stage with debility -Appreciate palliative evaluation -PT/OT/SLP evaluation Continue dysphagia 3 diet with nectar thick liquids. Continue aspiration precautions.  History of CVA -Continue Crestor -CT head with no acute findings  Essential hypertension Sinus tachycardia, resolved. Home Toprol-XL on hold Start p.o. Lopressor 12.5 mg twice daily Closely monitor vital signs and maintain MAP greater than 65   DVT prophylaxis: Subcu Lovenox daily. Code Status: DNR Family Communication: None at bedside. Disposition Plan:  Status is: Inpatient Remains inpatient appropriate because: Need for ongoing IV medications.   Time: 50 minutes.    Consultants:  Cardiology Palliative care  Procedures:  None  Antimicrobials:  Anti-infectives (From admission, onward)    Start     Dose/Rate Route Frequency Ordered Stop   09/04/22 1900  azithromycin (ZITHROMAX) 500 mg in sodium chloride 0.9 % 250 mL IVPB        500 mg 250 mL/hr over 60 Minutes Intravenous Every 24 hours 09/03/22 2229     09/04/22 1900  cefTRIAXone (ROCEPHIN) 1 g in sodium chloride 0.9 % 100 mL IVPB  Status:  Discontinued  1 g 200 mL/hr over 30 Minutes Intravenous Every 24 hours 09/03/22 2229 09/03/22 2301   09/04/22 0600   Ampicillin-Sulbactam (UNASYN) 3 g in sodium chloride 0.9 % 100 mL IVPB        3 g 200 mL/hr over 30 Minutes Intravenous Every 6 hours 09/03/22 2303     09/03/22 1845  cefTRIAXone (ROCEPHIN) 1 g in sodium chloride 0.9 % 100 mL IVPB        1 g 200 mL/hr over 30 Minutes Intravenous  Once 09/03/22 1836 09/03/22 2206   09/03/22 1845  azithromycin (ZITHROMAX) 500 mg in sodium chloride 0.9 % 250 mL IVPB        500 mg 250 mL/hr over 60 Minutes Intravenous  Once 09/03/22 1836 09/03/22 2206        Objective: Vitals:   09/07/22 0030 09/07/22 0400 09/07/22 0755 09/07/22 1048  BP:  (!) 142/66 139/61 (!) 149/84  Pulse: 79 85 88 99  Resp: (!) 26 (!) 21 (!) 23 20  Temp:  97.9 F (36.6 C) 98.2 F (36.8 C) 98.5 F (36.9 C)  TempSrc:  Oral Oral Oral  SpO2: 94% 93% 94% 96%  Weight:  92.5 kg    Height:        Intake/Output Summary (Last 24 hours) at 09/07/2022 1056 Last data filed at 09/07/2022 0841 Gross per 24 hour  Intake 2413.34 ml  Output 1925 ml  Net 488.34 ml   Filed Weights   09/05/22 0413 09/06/22 0321 09/07/22 0400  Weight: 90.4 kg 92.8 kg 92.5 kg    Examination:  General exam: Frail-appearing in no acute distress.  She is alert and minimally interactive. Respiratory system: Clear to auscultation with no wheezes or rales. Cardiovascular system: Regular rate and rhythm no rubs or gallops. Gastrointestinal system: Soft nontender normal bowel sounds present. Central nervous system: Awake and alert. Extremities: No lower extremity edema bilaterally. Skin: No significant lesions noted. Psychiatry: Mood is appropriate for condition setting.    Data Reviewed: I have personally reviewed following labs and imaging studies  CBC: Recent Labs  Lab 09/03/22 1720 09/04/22 0106 09/06/22 0112 09/06/22 1031  WBC 10.4  --  7.4 9.0  NEUTROABS 8.0*  --   --   --   HGB 11.0* 10.9* 9.7* 10.1*  HCT 35.3* 32.0* 30.3* 32.7*  MCV 91.9  --  90.4 91.6  PLT 287  --  211 220   Basic  Metabolic Panel: Recent Labs  Lab 09/03/22 1720 09/04/22 0056 09/04/22 0106 09/05/22 0116 09/06/22 0112 09/06/22 1031  NA 138  --  140 138 141 140  K 3.9  --  4.4 3.5 3.6 3.6  CL 99  --   --  107 105 106  CO2 27  --   --  22 25 25   GLUCOSE 154*  --   --  130* 141* 213*  BUN 17  --   --  14 16 13   CREATININE 0.95  --   --  0.80 0.93 0.96  CALCIUM 8.8*  --   --  7.9* 8.3* 8.2*  MG 1.9  --   --  1.9 1.9 1.8  PHOS  --  3.2  --   --   --  2.3*   GFR: Estimated Creatinine Clearance: 61.6 mL/min (by C-G formula based on SCr of 0.96 mg/dL). Liver Function Tests: Recent Labs  Lab 09/03/22 1720 09/06/22 1031  AST 26 28  ALT 27 28  ALKPHOS 57 53  BILITOT 0.8 0.6  PROT 6.5 6.0*  ALBUMIN 3.0* 2.7*   No results for input(s): "LIPASE", "AMYLASE" in the last 168 hours. Recent Labs  Lab 09/03/22 1645  AMMONIA <10   Coagulation Profile: Recent Labs  Lab 09/03/22 2345  INR 1.1   Cardiac Enzymes: Recent Labs  Lab 09/04/22 0056  CKTOTAL 493*   BNP (last 3 results) No results for input(s): "PROBNP" in the last 8760 hours. HbA1C: No results for input(s): "HGBA1C" in the last 72 hours.  CBG: Recent Labs  Lab 09/06/22 2058 09/07/22 0003 09/07/22 0408 09/07/22 0759 09/07/22 1048  GLUCAP 183* 142* 130* 132* 160*   Lipid Profile: No results for input(s): "CHOL", "HDL", "LDLCALC", "TRIG", "CHOLHDL", "LDLDIRECT" in the last 72 hours.  Thyroid Function Tests: Recent Labs    09/05/22 0738  FREET4 1.12   Anemia Panel: Recent Labs    09/05/22 0116  VITAMINB12 285   Sepsis Labs: Recent Labs  Lab 09/03/22 1645 09/04/22 0056  PROCALCITON  --  0.10  LATICACIDVEN 1.2 1.2    Recent Results (from the past 240 hour(s))  Culture, blood (routine x 2)     Status: None (Preliminary result)   Collection Time: 09/03/22  4:45 PM   Specimen: BLOOD LEFT ARM  Result Value Ref Range Status   Specimen Description BLOOD LEFT ARM  Final   Special Requests   Final    BOTTLES  DRAWN AEROBIC AND ANAEROBIC Blood Culture adequate volume   Culture   Final    NO GROWTH 4 DAYS Performed at [facility], [address], [place], [place] [address]    Report Status PENDING  Incomplete  SARS Coronavirus 2 by RT PCR (hospital order, performed in [facility] hospital lab) *cepheid single result test* Anterior Nasal Swab     Status: None   Collection Time: 09/03/22  5:50 PM   Specimen: Anterior Nasal Swab  Result Value Ref Range Status   SARS Coronavirus 2 by RT PCR NEGATIVE NEGATIVE Final    Comment: Performed at [facility], [address], [place], [place] [address]  Culture, blood (routine x 2)     Status: None (Preliminary result)   Collection Time: 09/04/22 12:56 AM   Specimen: BLOOD LEFT ARM  Result Value Ref Range Status   Specimen Description BLOOD LEFT ARM  Final   Special Requests   Final    BOTTLES DRAWN AEROBIC AND ANAEROBIC Blood Culture results may not be optimal due to an excessive volume of blood received in culture bottles   Culture   Final    NO GROWTH 3 DAYS Performed at [facility], [address], [place], [place] [address]    Report Status PENDING  Incomplete         Radiology Studies: No results found.      Scheduled Meds:  aspirin  81 mg Oral Daily   bisacodyl  10 mg Rectal Once   enoxaparin (LOVENOX) injection  40 mg Subcutaneous Q24H   guaiFENesin  600 mg Oral BID   insulin aspart  0-9 Units Subcutaneous Q4H   memantine  10 mg Oral BID   metoprolol tartrate  12.5 mg Oral BID   milk and molasses  1 enema Rectal Once   mouth rinse  15 mL Mouth Rinse 4 times per day   rosuvastatin  10 mg Oral Daily   Continuous Infusions:  ampicillin-sulbactam (UNASYN) IV 3 g (09/07/22 1308)   azithromycin 500 mg (09/06/22 2104)     LOS:  3 days    Time spent: 35 minutes    Darlin Drop, DO [facility]  If 7PM-7AM, please contact night-coverage [email or web address] 09/07/2022, 10:56 AM

## 2022-09-07 NOTE — Progress Notes (Signed)
Daily Progress Note   Patient Name: Caroline White       Date: 09/07/2022 DOB: [birth year]  Age: 72 y.o. MRN#: [id number] Attending Physician: Darlin Drop, DO Primary Care Physician: Blair Heys, MD Admit Date: 09/03/2022  Reason for Consultation/Follow-up: Establishing goals of care  Subjective: Medical records reviewed including progress notes, labs, imaging. Patient assessed at the bedside.  She is sleeping comfortably.  Did not attempt to arouse.  Her son and daughter-in-law are present visiting.  We relocated to a waiting room for scheduled family meeting.  I introduced Palliative Medicine as specialized medical care for people living with serious illness. It focuses on providing relief from the symptoms and stress of a serious illness. The goal is to improve quality of life for both the patient and the family.   Emotional support and therapeutic listening was provided as patient's family shared a detailed review of her ongoing decline for the past year, which got much worse over the past few weeks.  Patient is minimally verbal.  Her grandchildren are struggling seeing her decline and could benefit from additional resources.  She has spoken about her wishes in the past and that she wants to die at home.  She does not want aggressive care to keep her alive.  We discussed patient's prognosis of months at best and family is in agreement, having come to terms with this over time.  They have struggled with trying to get her equipment for home to honor her wishes.  The difference between palliative care and hospice was explained.  I recommended consideration of hospice given patient's wishes and that she would be unlikely to have a meaningful increase in her quality of life after  additional rehab.  We discussed hospice supports available in the home.  Family would like to discuss further with patient's daughter before proceeding with referral however they are leaning towards hospice at home.  I then received a call for therapy confirming that they would like to proceed with referral to [facility] today.  We discussed the option of comfort care in the hospital while coordinating for hospice at home.  Family prefers to continue with current care for now.  Questions and concerns addressed. PMT will continue to support holistically.   Length of Stay: 3   Physical Exam Vitals and  nursing note reviewed.  Constitutional:      General: She is sleeping. She is not in acute distress.    Appearance: She is ill-appearing.  Cardiovascular:     Rate and Rhythm: Normal rate.  Pulmonary:     Effort: Pulmonary effort is normal. No respiratory distress.  Skin:    General: Skin is warm and dry.             Vital Signs: BP 139/61 (BP Location: Right Arm)   Pulse 88   Temp 98.2 F (36.8 C) (Oral)   Resp (!) 23   Ht 5\' 6"  (1.676 m)   Wt 92.5 kg   SpO2 94%   BMI 32.91 kg/m  SpO2: SpO2: 94 % O2 Device: O2 Device: Room Air O2 Flow Rate: O2 Flow Rate (L/min): 1 L/min      Palliative Assessment/Data: 30%    Palliative Care Assessment & Plan   Patient Profile: 72 y.o. female  with past medical history of vascular Dementia, HTN, HLD, CVA, and DM2 presented to ED on 09/03/2022 from home after continued decline in mobility now with new sacral wounds after UTI last week.  Patient was admitted on 09/03/2022 with acute respiratory failure with hypoxia secondary to aspiration pneumonia and elevated troponin.    Cardiology was consulted for elevated troponin -suspect it's demand ischemia in setting of aspiration pneumonia.  No further cardiac workup recommended.  Speech-language pathology evaluation indicated moderate aspiration risk.  Assessment: Goals of care  conversation Advanced vascular dementia Acute respiratory failure with hypoxia secondary to aspiration pneumonia  Recommendations/Plan: Continue DNR/DNI Continue current care Received copies of MOST form and advanced directives from patient's family.  Will scan copy into EMR Patient's family have decided on referral to hospice at home, preference for [facility].  TOC consulted, assistance is appreciated Psychosocial and emotional support provided PMT will continue to follow and support   Prognosis:  < 6 months  Discharge Planning: Home with Hospice  Care plan was discussed with patient, patient's son, patient's daughter-in-law, DO, TOC   Total time: I spent 75 minutes in the care of the patient today in the above activities and documenting the encounter.          Onnie Hatchel Jeni Salles, PA-C  Palliative Medicine Team Team phone # [phone]  Thank you for allowing the Palliative Medicine Team to assist in the care of this patient. Please utilize secure chat with additional questions, if there is no response within 30 minutes please call the above phone number.  Palliative Medicine Team providers are available by phone from 7am to 7pm daily and can be reached through the team cell phone.  Should this patient require assistance outside of these hours, please call the patient's attending physician.

## 2022-09-07 NOTE — Care Management Important Message (Signed)
Important Message  Patient Details  Name: Caroline White MRN: [id number] Date of Birth: Feb 17, 1951   Medicare Important Message Given:  Yes     Renie Ora 09/07/2022, 10:20 AM

## 2022-09-08 DIAGNOSIS — J690 Pneumonitis due to inhalation of food and vomit: Secondary | ICD-10-CM | POA: Diagnosis not present

## 2022-09-08 LAB — GLUCOSE, CAPILLARY
Glucose-Capillary: 105 mg/dL — ABNORMAL HIGH (ref 70–99)
Glucose-Capillary: 117 mg/dL — ABNORMAL HIGH (ref 70–99)
Glucose-Capillary: 133 mg/dL — ABNORMAL HIGH (ref 70–99)
Glucose-Capillary: 136 mg/dL — ABNORMAL HIGH (ref 70–99)
Glucose-Capillary: 148 mg/dL — ABNORMAL HIGH (ref 70–99)
Glucose-Capillary: 163 mg/dL — ABNORMAL HIGH (ref 70–99)
Glucose-Capillary: 173 mg/dL — ABNORMAL HIGH (ref 70–99)
Glucose-Capillary: 180 mg/dL — ABNORMAL HIGH (ref 70–99)
Glucose-Capillary: 206 mg/dL — ABNORMAL HIGH (ref 70–99)

## 2022-09-08 MED ORDER — SODIUM CHLORIDE 0.9 % IV SOLN
3.0000 g | Freq: Four times a day (QID) | INTRAVENOUS | Status: AC
  Administered 2022-09-08: 3 g via INTRAVENOUS
  Filled 2022-09-08: qty 8

## 2022-09-08 NOTE — TOC Progression Note (Signed)
Transition of Care [facility]) - Progression Note    Patient Details  Name: Caroline White MRN: [id number] Date of Birth: 1950-08-26  Transition of Care [facility]) CM/SW Contact  Leone Haven, RN Phone Number: 09/08/2022, 2:07 PM  Clinical Narrative:    Per Cheri with [facility] states the DME has been delivered to the home.  NCM spoke with patient's DIL , she states they plan for patient to dc tomorrow, they are in process of setting up the DME and they will need to move a lot of furniture around, so they hope for patient to be transported by ambulance tomorrow.    Expected Discharge Plan: Skilled Nursing Facility Barriers to Discharge: Continued Medical Work up  Expected Discharge Plan and Services     Post Acute Care Choice: Skilled Nursing Facility Living arrangements for the past 2 months: Single Family Home Expected Discharge Date: 09/09/22                                     Social Determinants of Health (SDOH) Interventions SDOH Screenings   Food Insecurity: No Food Insecurity (09/04/2022)  Housing: Patient Declined (09/04/2022)  Transportation Needs: No Transportation Needs (09/04/2022)  Utilities: Not At Risk (09/04/2022)  Tobacco Use: Low Risk  (09/03/2022)    Readmission Risk Interventions     No data to display

## 2022-09-08 NOTE — Discharge Summary (Addendum)
Discharge Summary  Caroline White ZOX:[id number] DOB: [birth year]  PCP: Blair Heys, MD  Admit date: 09/03/2022 Discharge date: 09/08/2022  Time spent: 35 minutes.  Recommendations for Outpatient Follow-up:  Continue with hospice care.  Discharge Diagnoses:  Active Hospital Problems   Diagnosis Date Noted   Aspiration pneumonia (HCC) 09/03/2022   Acute respiratory failure with hypoxia (HCC) 09/03/2022   Dementia without behavioral disturbance (HCC) 09/03/2022   Constipation 09/03/2022   History of CVA (cerebrovascular accident) 09/03/2022   Type 2 diabetes mellitus with microalbuminuria, without long-term current use of insulin (HCC) 04/25/2020    Resolved Hospital Problems  No resolved problems to display.    Discharge Condition: Stable.  Diet recommendation: Pleasure feedings.  Vitals:   09/08/22 0822 09/08/22 1021  BP: (!) 131/57 128/69  Pulse: 76 75  Resp: 19 20  Temp: 97.9 F (36.6 C) 98.2 F (36.8 C)  SpO2: 100% 100%    History of present illness:  Caroline White is a 72 y.o. female with medical history significant of vascular Dementia, HTN, HLD, CVA (seen on CT head done 5 days ago), DM2.  Presented with debility, progressively weaker over the last 1 week, hypoxic with concern for aspiration pneumonia.  On presentation, noted to have elevated troponin for which she was started on heparin drip.  Seen by cardiology 09/04/22 and heparin drip was discontinued as this appears to be a type II MI.  Completed IV Unasyn and IV azithromycin on 09/08/2022.  Seen by palliative care team, family made decision for discharge to hospice care facility at [facility]    09/08/2022: The patient was seen and examined at bedside.  She is hypersomnolent.  Does not appear in distress.  Vital signs on the monitor are stable.  Plan to discharge to hospice care.    Hospital Course:  Principal Problem:   Aspiration pneumonia (HCC) Active Problems:   Type 2 diabetes  mellitus with microalbuminuria, without long-term current use of insulin (HCC)   Acute respiratory failure with hypoxia (HCC)   Dementia without behavioral disturbance (HCC)   Constipation   History of CVA (cerebrovascular accident)  Resolved acute hypoxemic respiratory failure secondary to aspiration pneumonia Not on oxygen supplementation at baseline No longer requiring oxygen supplementation. Completed course of Unasyn and IV azithromycin Peripheral blood cultures x 2 negative to date.   Treated left lower lobe pneumonia with concern for aspiration pneumonia Completed course of antibiotics Afebrile with no leukocytosis. Currently on dysphagia 3 diet with nectar thick liquids Continue aspiration precautions.   Elevated troponin from type II MI -No further evaluation recommended per cardiology -Heparin drip discontinued No anginal symptoms reported.   Hypophosphatemia Phosphorus 2.3 Repleted intravenously.   Hypoalbuminemia Albumin 2.7 Encourage oral protein calorie intake as tolerated   Type 2 diabetes, well-controlled. -Continue SSI as needed -A1c 5.8% -TSH 4.5, free T4 1.12   Acute metabolic encephalopathy in the setting of dementia Likely dementia was exacerbated by her acute illness Continue to reorient as needed   History of CVA CT head showing old stroke in the right frontal lobe.  Also findings of chronic microvascular disease.   Essential hypertension Sinus tachycardia, resolved. Vital signs are stable.  Pressure injury right ankle stage 1, present on admission  Local wound care.    CODE STATUS: DNR.     Consultants:  Cardiology Palliative care   Procedures:  None   Antimicrobials:    Discharge Exam: BP 128/69 (BP Location: Left Arm)   Pulse 75   Temp 98.2  F (36.8 C) (Oral)   Resp 20   Ht 5\' 6"  (1.676 m)   Wt 92 kg   SpO2 100%   BMI 32.74 kg/m  General: 72 y.o. year-old female well developed well nourished in no acute distress.   Somnolent. Cardiovascular: Regular rate and rhythm no rubs or gallops. Respiratory: Clear to auscultation with no wheezes or rales.  Poor inspiratory effort. Abdomen: Soft nontender nondistended with normal bowel sounds x4 quadrants. Musculoskeletal: No lower extremity edema. Psychiatry: Somnolent.  Unable to assess mood.  Discharge Instructions You were cared for by a hospitalist during your hospital stay. If you have any questions about your discharge medications or the care you received while you were in the hospital after you are discharged, you can call the unit and asked to speak with the hospitalist on call if the hospitalist that took care of you is not available. Once you are discharged, your primary care physician will handle any further medical issues. Please note that NO REFILLS for any discharge medications will be authorized once you are discharged, as it is imperative that you return to your primary care physician (or establish a relationship with a primary care physician if you do not have one) for your aftercare needs so that they can reassess your need for medications and monitor your lab values.   Allergies as of 09/08/2022       Reactions   Fish Oil    Heart racing   Sulfa Antibiotics Other (See Comments)   Unknown reaction per daughter   Sulfa Drugs Cross Reactors Other (See Comments)   Unknown   Zestril [lisinopril] Cough        Medication List     STOP taking these medications    aspirin 81 MG chewable tablet   cephALEXin 500 MG capsule Commonly known as: KEFLEX   memantine 10 MG tablet Commonly known as: NAMENDA   metoprolol succinate 25 MG 24 hr tablet Commonly known as: TOPROL-XL   rosuvastatin 20 MG tablet Commonly known as: CRESTOR   valsartan-hydrochlorothiazide 80-12.5 MG tablet Commonly known as: DIOVAN-HCT               Durable Medical Equipment  (From admission, onward)           Start     Ordered   09/08/22 0455  For home  use only DME Overbed table  Once        09/08/22 0454   09/08/22 0454  For home use only DME Hospital bed  Once       Question Answer Comment  Length of Need 6 Months   Bed type Semi-electric      09/08/22 0454           Allergies  Allergen Reactions   Fish Oil     Heart racing   Sulfa Antibiotics Other (See Comments)    Unknown reaction per daughter   Sulfa Drugs Cross Reactors Other (See Comments)    Unknown    Zestril [Lisinopril] Cough    Follow-up Information     [place] Follow up.   Specialty: Home Health Services Why: home Hospice Contact information: [address]  [place] [address] [phone]                  The results of significant diagnostics from this hospitalization (including imaging, microbiology, ancillary and laboratory) are listed below for reference.    Significant Diagnostic Studies: VAS [place] ABI WITH/WO TBI  Result Date:  09/04/2022  LOWER EXTREMITY DOPPLER STUDY Patient Name:  Nikesha Adema  Date of Exam:   09/04/2022 Medical Rec #: [id number]               Accession #:    [id number] Date of Birth: May 28, 1950              Patient Gender: F Patient Age:   52 years Exam Location:  [facility] Procedure:      VAS [place] ABI WITH/WO TBI Referring Phys: Jonny Ruiz DOUTOVA --------------------------------------------------------------------------------  Indications: Claudication. High Risk Factors: Hypertension, hyperlipidemia, Diabetes, prior CVA.  Comparison Study: No previous study. Performing Technologist: McKayla Maag RVT, [place]  Examination Guidelines: A complete evaluation includes at minimum, Doppler waveform signals and systolic blood pressure reading at the level of bilateral brachial, anterior tibial, and posterior tibial arteries, when vessel segments are accessible. Bilateral testing is considered an integral part of a complete examination. Photoelectric Plethysmograph (PPG) waveforms and toe systolic  pressure readings are included as required and additional duplex testing as needed. Limited examinations for reoccurring indications may be performed as noted.  ABI Findings: +---------+------------------+-----+---------+---------------------------------+ Right    Rt Pressure (mmHg)IndexWaveform Comment                           +---------+------------------+-----+---------+---------------------------------+ Brachial                        triphasicNo pressure obtained due to IV                                             placement                         +---------+------------------+-----+---------+---------------------------------+ PTA      158               1.18 triphasic                                  +---------+------------------+-----+---------+---------------------------------+ DP       150               1.12 triphasic                                  +---------+------------------+-----+---------+---------------------------------+ Great Toe118               0.88 Normal                                     +---------+------------------+-----+---------+---------------------------------+ +---------+------------------+-----+---------+-------+ Left     Lt Pressure (mmHg)IndexWaveform Comment +---------+------------------+-----+---------+-------+ Brachial 134                    triphasic        +---------+------------------+-----+---------+-------+ PTA      156               1.16 triphasic        +---------+------------------+-----+---------+-------+ DP       171               1.28 triphasic        +---------+------------------+-----+---------+-------+  Great Toe104               0.78 Normal           +---------+------------------+-----+---------+-------+ +-------+-----------+-----------+------------+------------+ ABI/TBIToday's ABIToday's TBIPrevious ABIPrevious TBI +-------+-----------+-----------+------------+------------+ Right  1.18        0.88                                +-------+-----------+-----------+------------+------------+ Left   1.28       0.78                                +-------+-----------+-----------+------------+------------+  *See table(s) above for measurements and observations.  Electronically signed by Waverly Ferrari MD on 09/04/2022 at 5:07:24 PM.    Final    ECHOCARDIOGRAM COMPLETE  Result Date: 09/04/2022    ECHOCARDIOGRAM REPORT   Patient Name:   Wakeelah MERRIT Heino Date of Exam: 09/04/2022 Medical Rec #:  [id number]              Height:       66.0 in Accession #:    [id number]             Weight:       179.9 lb Date of Birth:  [birth year]             BSA:          1.912 m Patient Age:    71 years               BP:           120/59 mmHg Patient Gender: F                      HR:           84 bpm. Exam Location:  Inpatient Procedure: 2D Echo, Cardiac Doppler and Color Doppler Indications:    Elevated Troponin  History:        Patient has prior history of Echocardiogram examinations, most                 recent 09/11/2020. Stroke and Dementia; Risk Factors:Diabetes,                 Dyslipidemia and Non-Smoker.  Sonographer:    Dondra Prader RVT RCS Referring Phys: [id number] ERIC CHEN  Sonographer Comments: Technically challenging study due to limited acoustic windows, Technically difficult study due to poor echo windows, suboptimal apical window, suboptimal parasternal window and suboptimal subcostal window. Image acquisition challenging due to patient body habitus. Patient was combative and uncooperative throughout exam. Ended exam. IMPRESSIONS  1. Left ventricular ejection fraction, by estimation, is 60%. The left ventricle has normal function. The left ventricle has no regional wall motion abnormalities. There is severe left ventricular hypertrophy of the septal segment. Left ventricular diastolic parameters are consistent with Grade I diastolic dysfunction (impaired relaxation).  2. Right ventricular systolic  function is normal. The right ventricular size is mildly enlarged. There is normal pulmonary artery systolic pressure. The estimated right ventricular systolic pressure is 25.5 mmHg.  3. The mitral valve is normal in structure. No evidence of mitral valve regurgitation. No evidence of mitral stenosis.  4. The aortic valve is tricuspid. There is mild calcification of the aortic valve. Aortic valve regurgitation is not visualized. No aortic stenosis is present.  5. The inferior vena cava is normal in  size with greater than 50% respiratory variability, suggesting right atrial pressure of 3 mmHg. FINDINGS  Left Ventricle: Left ventricular ejection fraction, by estimation, is 60%. The left ventricle has normal function. The left ventricle has no regional wall motion abnormalities. The left ventricular internal cavity size was normal in size. There is severe left ventricular hypertrophy of the septal segment. Abnormal (paradoxical) septal motion, consistent with left bundle branch block. Left ventricular diastolic parameters are consistent with Grade I diastolic dysfunction (impaired relaxation). Right Ventricle: The right ventricular size is mildly enlarged. No increase in right ventricular wall thickness. Right ventricular systolic function is normal. There is normal pulmonary artery systolic pressure. The tricuspid regurgitant velocity is 2.37  m/s, and with an assumed right atrial pressure of 3 mmHg, the estimated right ventricular systolic pressure is 25.5 mmHg. Left Atrium: Left atrial size was normal in size. Right Atrium: Right atrial size was normal in size. Pericardium: Trivial pericardial effusion is present. Presence of epicardial fat layer. Mitral Valve: The mitral valve is normal in structure. Mild mitral annular calcification. No evidence of mitral valve regurgitation. No evidence of mitral valve stenosis. Tricuspid Valve: The tricuspid valve is normal in structure. Tricuspid valve regurgitation is trivial.  No evidence of tricuspid stenosis. Aortic Valve: The aortic valve is tricuspid. There is mild calcification of the aortic valve. Aortic valve regurgitation is not visualized. No aortic stenosis is present. Aortic valve mean gradient measures 4.0 mmHg. Aortic valve peak gradient measures 6.2 mmHg. Aortic valve area, by VTI measures 3.18 cm. Pulmonic Valve: The pulmonic valve was normal in structure. Pulmonic valve regurgitation is trivial. No evidence of pulmonic stenosis. Aorta: The aortic root is normal in size and structure. Venous: The inferior vena cava is normal in size with greater than 50% respiratory variability, suggesting right atrial pressure of 3 mmHg. IAS/Shunts: The interatrial septum was not well visualized.  LEFT VENTRICLE PLAX 2D LVIDd:         3.40 cm   Diastology LVIDs:         1.90 cm   LV e' medial:    4.68 cm/s LV PW:         1.10 cm   LV E/e' medial:  13.7 LV IVS:        2.00 cm   LV e' lateral:   5.77 cm/s LVOT diam:     2.10 cm   LV E/e' lateral: 11.1 LV SV:         77 LV SV Index:   40 LVOT Area:     3.46 cm  RIGHT VENTRICLE RV Basal diam:  2.60 cm RV S prime:     12.50 cm/s TAPSE (M-mode): 2.0 cm LEFT ATRIUM           Index        RIGHT ATRIUM          Index LA diam:      3.30 cm 1.73 cm/m   RA Area:     9.17 cm LA Vol (A2C): 23.1 ml 12.08 ml/m  RA Volume:   14.20 ml 7.43 ml/m LA Vol (A4C): 23.1 ml 12.08 ml/m  AORTIC VALVE                    PULMONIC VALVE AV Area (Vmax):    2.96 cm     PV Vmax:       1.05 m/s AV Area (Vmean):   3.14 cm     PV Peak grad:  4.4 mmHg  AV Area (VTI):     3.18 cm AV Vmax:           125.00 cm/s AV Vmean:          94.700 cm/s AV VTI:            0.243 m AV Peak Grad:      6.2 mmHg AV Mean Grad:      4.0 mmHg LVOT Vmax:         107.00 cm/s LVOT Vmean:        85.900 cm/s LVOT VTI:          0.223 m LVOT/AV VTI ratio: 0.92  AORTA Ao Root diam: 3.40 cm Ao Asc diam:  3.50 cm MITRAL VALVE               TRICUSPID VALVE MV Area (PHT): 2.84 cm    TR Peak grad:    22.5 mmHg MV Decel Time: 267 msec    TR Vmax:        237.00 cm/s MV E velocity: 63.90 cm/s MV A velocity: 91.80 cm/s  SHUNTS MV E/A ratio:  0.70        Systemic VTI:  0.22 m                            Systemic Diam: 2.10 cm Weston Brass MD Electronically signed by Weston Brass MD Signature Date/Time: 09/04/2022/1:11:52 PM    Final    DG Abd 1 View  Result Date: 09/04/2022 CLINICAL DATA:  Provided history: Constipation. EXAM: ABDOMEN - 1 VIEW COMPARISON:  CT chest/abdomen/pelvis 09/03/2022. FINDINGS: Portions of the upper abdomen are excluded from the field of view. No dilated loops of small bowel are demonstrated within the imaged abdomen to suggest small bowel obstruction. Small to moderate stool burden, greatest within the right colon. No acute osseous abnormality identified. Lumbar spondylosis. Residual contrast within the urinary bladder. IMPRESSION: 1. Portions of the upper abdomen are excluded from the field of view. 2. No dilated loops of small bowel within the imaged abdomen to suggest small bowel obstruction. 3. Small-to-moderate stool burden, greatest within the right colon. Electronically Signed   By: Jackey Loge D.O.   On: 09/04/2022 08:28   CT Head Wo Contrast  Result Date: 09/03/2022 CLINICAL DATA:  Altered mental status EXAM: CT HEAD WITHOUT CONTRAST TECHNIQUE: Contiguous axial images were obtained from the base of the skull through the vertex without intravenous contrast. RADIATION DOSE REDUCTION: This exam was performed according to the departmental dose-optimization program which includes automated exposure control, adjustment of the mA and/or kV according to patient size and/or use of iterative reconstruction technique. COMPARISON:  02/07/2020 and 12/02/2020 FINDINGS: Brain: There is no mass, hemorrhage or extra-axial collection. There is generalized atrophy without lobar predilection. Hypodensity of the white matter is most commonly associated with chronic microvascular disease. Old  right frontal infarct. Vascular: No abnormal hyperdensity of the major intracranial arteries or dural venous sinuses. No intracranial atherosclerosis. Skull: The visualized skull base, calvarium and extracranial soft tissues are normal. Sinuses/Orbits: No fluid levels or advanced mucosal thickening of the visualized paranasal sinuses. No mastoid or middle ear effusion. The orbits are normal. IMPRESSION: 1. No acute intracranial abnormality. 2. 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 Electronically Signed   By: Deatra Robinson M.D.   On: 09/03/2022 19:39   CT CHEST ABDOMEN PELVIS W CONTRAST  Result Date: 09/03/2022 CLINICAL DATA:  Sepsis EXAM: CT  CHEST, ABDOMEN, AND PELVIS WITH CONTRAST TECHNIQUE: Multidetector CT imaging of the chest, abdomen and pelvis was performed following the standard protocol during bolus administration of intravenous contrast. RADIATION DOSE REDUCTION: This exam was performed according to the departmental dose-optimization program which includes automated exposure control, adjustment of the mA and/or kV according to patient size and/or use of iterative reconstruction technique. CONTRAST:  75mL OMNIPAQUE IOHEXOL 350 MG/ML SOLN COMPARISON:  Chest radiograph 09/03/2022; CT abdomen and pelvis 03/21/2020 FINDINGS: CT CHEST FINDINGS Cardiovascular: Normal heart size. No pericardial effusion. Coronary artery and aortic atherosclerotic calcification. Mediastinum/Nodes: Unremarkable trachea and esophagus. No thoracic adenopathy. Lungs/Pleura: Elevated right hemidiaphragm. Bibasilar atelectasis. Pneumonia is difficult to exclude. Multiple small nodules are present bilaterally. For example 3 mm nodule in the right middle lobe on 5/61, 4 mm nodule in the right upper lobe on 05/30. Musculoskeletal: No acute fracture or destructive osseous lesion. CT ABDOMEN PELVIS FINDINGS Hepatobiliary: Unremarkable liver. Wall calcifications in the gallbladder fundus. Distention of the  gallbladder unchanged from prior. No wall thickening or pericholecystic fluid. No biliary dilation. Pancreas: Unremarkable. Spleen: Unremarkable. Adrenals/Urinary Tract: Normal adrenal glands. No urinary calculi or hydronephrosis. Unremarkable bladder. Stomach/Bowel: Normal caliber large and small bowel. No bowel wall thickening. Stool ball in the rectum. Normal appendix. Stomach is within normal limits. Vascular/Lymphatic: Aortic atherosclerosis. 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 No enlarged abdominal or pelvic lymph nodes. Reproductive: No acute abnormality. Other: No free intraperitoneal fluid or air. Pelvic floor laxity with rectocele. Musculoskeletal: No acute fracture or destructive osseous lesion. IMPRESSION: 1. Bibasilar atelectasis. Pneumonia is difficult to exclude. 2. Multiple small pulmonary nodules measuring up to 4 mm. No follow-up needed if patient is low-risk (and has no known or suspected primary neoplasm). Non-contrast chest CT can be considered in 12 months if patient is high-risk. This recommendation follows the consensus statement: Guidelines for Management of Incidental Pulmonary Nodules Detected on CT Images: From the Fleischner Society 2017; Radiology 2017; 284:228-243. 3. No acute abnormality in the abdomen or pelvis. Aortic Atherosclerosis (ICD10-I70.0). Electronically Signed   By: Minerva Fester M.D.   On: 09/03/2022 19:35   DG Chest Port 1 View  Result Date: 09/03/2022 CLINICAL DATA:  Weakness. EXAM: PORTABLE CHEST 1 VIEW COMPARISON:  Chest radiograph dated 08/26/2022 head FINDINGS: There is shallow inspiration with bibasilar atelectasis. Developing infiltrate at the left lung base is not excluded clinical correlation is recommended. No pleural effusion or pneumothorax. The cardiac silhouette is within normal limits. Atherosclerotic calcification of the aortic arch. No acute osseous pathology. IMPRESSION: Left lung base atelectasis or developing infiltrate.  Electronically Signed   By: Elgie Collard M.D.   On: 09/03/2022 17:12   DG Chest Port 1 View  Result Date: 08/26/2022 CLINICAL DATA:  Weakness EXAM: PORTABLE CHEST 1 VIEW COMPARISON:  X-ray 02/07/2020 FINDINGS: Underinflation. Mild basilar atelectasis. No consolidation, pneumothorax, effusion or edema. Normal cardiopericardial silhouette. Overlapping cardiac leads. Distended stomach with air seen beneath the left hemidiaphragm. Film is slightly rotated to the left. IMPRESSION: Rotated underinflated x-ray with some basilar atelectasis. Electronically Signed   By: Karen Kays M.D.   On: 08/26/2022 14:11    Microbiology: Recent Results (from the past 240 hour(s))  Culture, blood (routine x 2)     Status: None   Collection Time: 09/03/22  4:45 PM   Specimen: BLOOD LEFT ARM  Result Value Ref Range Status   Specimen Description BLOOD LEFT ARM  Final   Special Requests   Final    BOTTLES DRAWN AEROBIC AND ANAEROBIC  Blood Culture adequate volume   Culture   Final    NO GROWTH 5 DAYS Performed at [facility], [address] [place], [place] [address]    Report Status 09/08/2022 FINAL  Final  SARS Coronavirus 2 by RT PCR (hospital order, performed in [facility] hospital lab) *cepheid single result test* Anterior Nasal Swab     Status: None   Collection Time: 09/03/22  5:50 PM   Specimen: Anterior Nasal Swab  Result Value Ref Range Status   SARS Coronavirus 2 by RT PCR NEGATIVE NEGATIVE Final    Comment: Performed at [facility], [address] [place], [place] [address]  Culture, blood (routine x 2)     Status: None (Preliminary result)   Collection Time: 09/04/22 12:56 AM   Specimen: BLOOD LEFT ARM  Result Value Ref Range Status   Specimen Description BLOOD LEFT ARM  Final   Special Requests   Final    BOTTLES DRAWN AEROBIC AND ANAEROBIC Blood Culture results may not be optimal due to an excessive volume of blood received in culture bottles   Culture   Final    NO GROWTH  4 DAYS Performed at [facility], [address] [place], [place] [address]    Report Status PENDING  Incomplete     Labs: Basic Metabolic Panel: Recent Labs  Lab 09/03/22 1720 09/04/22 0056 09/04/22 0106 09/05/22 0116 09/06/22 0112 09/06/22 1031  NA 138  --  140 138 141 140  K 3.9  --  4.4 3.5 3.6 3.6  CL 99  --   --  107 105 106  CO2 27  --   --  22 25 25   GLUCOSE 154*  --   --  130* 141* 213*  BUN 17  --   --  14 16 13   CREATININE 0.95  --   --  0.80 0.93 0.96  CALCIUM 8.8*  --   --  7.9* 8.3* 8.2*  MG 1.9  --   --  1.9 1.9 1.8  PHOS  --  3.2  --   --   --  2.3*   Liver Function Tests: Recent Labs  Lab 09/03/22 1720 09/06/22 1031  AST 26 28  ALT 27 28  ALKPHOS 57 53  BILITOT 0.8 0.6  PROT 6.5 6.0*  ALBUMIN 3.0* 2.7*   No results for input(s): "LIPASE", "AMYLASE" in the last 168 hours. Recent Labs  Lab 09/03/22 1645  AMMONIA <10   CBC: Recent Labs  Lab 09/03/22 1720 09/04/22 0106 09/06/22 0112 09/06/22 1031  WBC 10.4  --  7.4 9.0  NEUTROABS 8.0*  --   --   --   HGB 11.0* 10.9* 9.7* 10.1*  HCT 35.3* 32.0* 30.3* 32.7*  MCV 91.9  --  90.4 91.6  PLT 287  --  211 220   Cardiac Enzymes: Recent Labs  Lab 09/04/22 0056  CKTOTAL 493*   BNP: BNP (last 3 results) No results for input(s): "BNP" in the last 8760 hours.  ProBNP (last 3 results) No results for input(s): "PROBNP" in the last 8760 hours.  CBG: Recent Labs  Lab 09/07/22 2005 09/08/22 0007 09/08/22 0409 09/08/22 0820 09/08/22 1025  GLUCAP 254* 180* 117* 105* 173*       Signed:  Darlin Drop, MD [facility] 09/08/2022, 11:29 AM

## 2022-09-08 NOTE — Progress Notes (Signed)
Physical Therapy Treatment Patient Details Name: Caroline White MRN: [id number] DOB: 1950-06-12 Today's Date: 09/08/2022   History of Present Illness 72 y.o. female admitted for aspiration PNA and debility. PMH: vascular Dementia, HTN, HLD, CVA, DM2. Recent admit last weak for UTI, was ambulating indep up until 2 weeks ago and she hasn't been out of bed    PT Comments    Pt received in supine and agreeable to session. Pt demonstrating fair BLE strength with supine and seated exercises. Pt able to assist with moving towards EOB, however was unable to pull on bedrail enough to move forward requiring max A. Pt able to sit EOB for ~5 mins and demonstrates improved sitting balance requiring supervision for safety, but no LOB. Pt able to perform seated exercises without LOB. Pt requiring increased assist to return to supine due to weakness. Pt continues to benefit from PT services to progress toward functional mobility goals.    Recommendations for follow up therapy are one component of a multi-disciplinary discharge planning process, led by the attending physician.  Recommendations may be updated based on patient status, additional functional criteria and insurance authorization.     Assistance Recommended at Discharge Frequent or constant Supervision/Assistance  Patient can return home with the following Two people to help with walking and/or transfers;Two people to help with bathing/dressing/bathroom;Assistance with cooking/housework;Direct supervision/assist for medications management;Assist for transportation;Help with stairs or ramp for entrance;Direct supervision/assist for financial management   Equipment Recommendations       Recommendations for Other Services       Precautions / Restrictions Precautions Precautions: Fall Precaution Comments: expressive difficulties Required Braces or Orthoses: Other Brace Other Brace: B prevalon boots Restrictions Weight Bearing  Restrictions: No     Mobility  Bed Mobility Overal bed mobility: Needs Assistance Bed Mobility: Supine to Sit, Sit to Supine     Supine to sit: Max assist, HOB elevated Sit to supine: Max assist, +2 for physical assistance   General bed mobility comments: Pt able to assist with advancing BLE to EOB and reach across to bedrail, but is unable to pull significantly on the bedrail due to weakness. Max A with bedpad to sit EOB and min A for trunk elevation. Max A +2 to return to supine due to weakness.    Transfers                   General transfer comment: deferred       Balance Overall balance assessment: Needs assistance Sitting-balance support: Feet supported, No upper extremity supported Sitting balance-Leahy Scale: Fair Sitting balance - Comments: Pt able to static sit without assist to perform BLE exercises       Standing balance comment: not tested                            Cognition Arousal/Alertness: Awake/alert Behavior During Therapy: Flat affect Overall Cognitive Status: History of cognitive impairments - at baseline                                 General Comments: Pt able to follow simple step-by step commands and answer questions appropriately with increased time        Exercises General Exercises - Lower Extremity Quad Sets: AROM, Supine, Both, 5 reps Long Arc Quad: AROM, Seated, Both, 5 reps Heel Slides: AROM, Supine, Both, 5 reps Straight Leg Raises: AAROM, Supine,  Both, 5 reps    General Comments General comments (skin integrity, edema, etc.): VSS      Pertinent Vitals/Pain Pain Assessment Pain Assessment: No/denies pain     PT Goals (current goals can now be found in the care plan section) Acute Rehab PT Goals Patient Stated Goal: unable to state PT Goal Formulation: Patient unable to participate in goal setting Time For Goal Achievement: 09/19/22 Potential to Achieve Goals: Fair Progress towards PT  goals: Progressing toward goals    Frequency    Min 2X/week      PT Plan Current plan remains appropriate       AM-PAC PT "6 Clicks" Mobility   Outcome Measure  Help needed turning from your back to your side while in a flat bed without using bedrails?: A Lot Help needed moving from lying on your back to sitting on the side of a flat bed without using bedrails?: A Lot Help needed moving to and from a bed to a chair (including a wheelchair)?: Total Help needed standing up from a chair using your arms (e.g., wheelchair or bedside chair)?: Total Help needed to walk in hospital room?: Total Help needed climbing 3-5 steps with a railing? : Total 6 Click Score: 8    End of Session Equipment Utilized During Treatment: Gait belt;Oxygen Activity Tolerance: Patient tolerated treatment well Patient left: in chair;with chair alarm set;with call bell/phone within reach Nurse Communication: Mobility status PT Visit Diagnosis: Unsteadiness on feet (R26.81);Muscle weakness (generalized) (M62.81);Difficulty in walking, not elsewhere classified (R26.2)     Time: 1610-9604 PT Time Calculation (min) (ACUTE ONLY): 23 min  Charges:  $Therapeutic Exercise: 8-22 mins $Therapeutic Activity: 8-22 mins                     Johny Shock, PTA Acute Rehabilitation Services Secure Chat Preferred  [phone]    Johny Shock 09/08/2022, 12:41 PM

## 2022-09-08 NOTE — Progress Notes (Signed)
   Medical records reviewed. Goals of care are clear at this time. Patient is to discharge home with hospice today.   Family have been encouraged to reach out to PMT with additional questions and concerns. PMT will follow peripherally at this time.  Thank you for your referral and allowing PMT to assist in Mrs. Caroline White's care.   Richardson Dopp, [facility] Palliative Medicine Team  Team Phone # [phone]   NO CHARGE

## 2022-09-09 LAB — GLUCOSE, CAPILLARY
Glucose-Capillary: 112 mg/dL — ABNORMAL HIGH (ref 70–99)
Glucose-Capillary: 113 mg/dL — ABNORMAL HIGH (ref 70–99)
Glucose-Capillary: 140 mg/dL — ABNORMAL HIGH (ref 70–99)

## 2022-09-09 LAB — CULTURE, BLOOD (ROUTINE X 2): Culture: NO GROWTH

## 2022-09-09 NOTE — TOC Transition Note (Addendum)
Transition of Care [facility]) - CM/SW Discharge Note   Patient Details  Name: Caroline White MRN: [id number] Date of Birth: December 12, 1950  [facility] CM/SW Contact:  Leone Haven, RN Phone Number: 09/09/2022, 10:31 AM   Clinical Narrative:    Patient is for dc today, home with hospice of [place],  [place] informed Cheri .  Hospice is providing  DME ( home oxygen, hoyer lift, w/chair, hospital bed, bedside table).  PTAR has been scheduled.  NCM spoke with Darl Pikes asked if private duty was set up, DIL she states she has a meeting with [employer] at 4:30 today about getting private duty care for patient.  DIL states her aunt takes care of the patient and she is a CNA and she is  in and out , but not out no more than 2 hrs,  but they will hire private duty also.  Patient husband is still able to do some things for patient, he fixes the food for her etc. DIL 's aunt give her medications.  Cheri with Hospice states that when the time comes for her to have 24 hr care they will be recommending this for them as well.  DIL and son lives next door to patient as well and they are in and out as well checking on her.     Final next level of care: Skilled Nursing Facility Barriers to Discharge: Continued Medical Work up   Patient Goals and CMS Choice      Discharge Placement                         Discharge Plan and Services Additional resources added to the After Visit Summary for       Post Acute Care Choice: Skilled Nursing Facility                               Social Determinants of Health (SDOH) Interventions SDOH Screenings   Food Insecurity: No Food Insecurity (09/04/2022)  Housing: Patient Declined (09/04/2022)  Transportation Needs: No Transportation Needs (09/04/2022)  Utilities: Not At Risk (09/04/2022)  Tobacco Use: Low Risk  (09/03/2022)     Readmission Risk Interventions     No data to display

## 2022-09-09 NOTE — Progress Notes (Signed)
Patient seen and examined, no changes from discharge summary yesterday, plan for discharge with home hospice today  Zannie Cove MD

## 2022-11-15 DEATH — deceased

## 2022-11-20 ENCOUNTER — Ambulatory Visit: Payer: Medicare Other | Admitting: Physician Assistant
